# Patient Record
Sex: Male | Born: 1970
Health system: Southern US, Community
[De-identification: ages and names within clinical notes are randomized; demographics above are authoritative.]

## PROBLEM LIST (undated history)

## (undated) DIAGNOSIS — I10 Essential (primary) hypertension: Secondary | ICD-10-CM

## (undated) DIAGNOSIS — Z21 Asymptomatic human immunodeficiency virus [HIV] infection status: Secondary | ICD-10-CM

## (undated) DIAGNOSIS — B20 Human immunodeficiency virus [HIV] disease: Secondary | ICD-10-CM

## (undated) DIAGNOSIS — D849 Immunodeficiency, unspecified: Secondary | ICD-10-CM

## (undated) DIAGNOSIS — F319 Bipolar disorder, unspecified: Secondary | ICD-10-CM

## (undated) HISTORY — DX: Human immunodeficiency virus (HIV) disease: B20

## (undated) HISTORY — PX: CERVICAL FUSION: SHX112

## (undated) HISTORY — DX: Asymptomatic human immunodeficiency virus (hiv) infection status: Z21

## (undated) HISTORY — PX: BUNIONECTOMY: SHX129

---

## 2008-02-26 LAB — CONVERTED CEMR LAB: Hepatitis B Surface Ag: NEGATIVE

## 2008-03-06 ENCOUNTER — Encounter: Payer: Self-pay | Admitting: Internal Medicine

## 2008-03-07 LAB — CONVERTED CEMR LAB
Hepatitis B Surface Ag: NEGATIVE
Hepatitis B Surface Ag: NEGATIVE

## 2008-08-20 ENCOUNTER — Emergency Department (HOSPITAL_COMMUNITY): Admission: EM | Admit: 2008-08-20 | Discharge: 2008-08-20 | Payer: Self-pay | Admitting: Emergency Medicine

## 2008-09-05 ENCOUNTER — Emergency Department (HOSPITAL_COMMUNITY): Admission: EM | Admit: 2008-09-05 | Discharge: 2008-09-05 | Payer: Self-pay | Admitting: Emergency Medicine

## 2008-10-02 LAB — CONVERTED CEMR LAB
CD4 Count: 430 microliters
CD4 T Helper %: 21.7 %

## 2008-12-24 ENCOUNTER — Emergency Department (HOSPITAL_COMMUNITY): Admission: EM | Admit: 2008-12-24 | Discharge: 2008-12-24 | Payer: Self-pay | Admitting: Emergency Medicine

## 2009-04-09 ENCOUNTER — Emergency Department (HOSPITAL_COMMUNITY): Admission: EM | Admit: 2009-04-09 | Discharge: 2009-04-09 | Payer: Self-pay | Admitting: Emergency Medicine

## 2009-07-04 ENCOUNTER — Emergency Department (HOSPITAL_COMMUNITY): Admission: EM | Admit: 2009-07-04 | Discharge: 2009-07-04 | Payer: Self-pay | Admitting: Emergency Medicine

## 2009-08-01 ENCOUNTER — Emergency Department (HOSPITAL_COMMUNITY): Admission: EM | Admit: 2009-08-01 | Discharge: 2009-08-01 | Payer: Self-pay | Admitting: Emergency Medicine

## 2009-09-10 LAB — CONVERTED CEMR LAB
CD4 T Helper %: 33.2 %
Creatinine, Ser: 1.2 mg/dL
Glucose, Bld: 119 mg/dL

## 2009-09-16 ENCOUNTER — Emergency Department (HOSPITAL_COMMUNITY): Admission: EM | Admit: 2009-09-16 | Discharge: 2009-09-16 | Payer: Self-pay | Admitting: Emergency Medicine

## 2009-09-25 ENCOUNTER — Ambulatory Visit: Payer: Self-pay | Admitting: Internal Medicine

## 2009-09-25 DIAGNOSIS — D573 Sickle-cell trait: Secondary | ICD-10-CM | POA: Insufficient documentation

## 2009-09-25 DIAGNOSIS — F319 Bipolar disorder, unspecified: Secondary | ICD-10-CM | POA: Insufficient documentation

## 2009-09-25 DIAGNOSIS — I10 Essential (primary) hypertension: Secondary | ICD-10-CM | POA: Insufficient documentation

## 2009-09-25 DIAGNOSIS — M5030 Other cervical disc degeneration, unspecified cervical region: Secondary | ICD-10-CM | POA: Insufficient documentation

## 2009-09-25 DIAGNOSIS — A5400 Gonococcal infection of lower genitourinary tract, unspecified: Secondary | ICD-10-CM | POA: Insufficient documentation

## 2009-11-26 ENCOUNTER — Ambulatory Visit: Payer: Self-pay | Admitting: Internal Medicine

## 2009-11-26 DIAGNOSIS — B20 Human immunodeficiency virus [HIV] disease: Secondary | ICD-10-CM | POA: Insufficient documentation

## 2009-11-26 DIAGNOSIS — Z87898 Personal history of other specified conditions: Secondary | ICD-10-CM

## 2009-11-26 LAB — CONVERTED CEMR LAB: HIV 1 RNA Quant: 81 copies/mL — ABNORMAL HIGH (ref <48)

## 2009-12-01 LAB — CONVERTED CEMR LAB
AST: 185 units/L — ABNORMAL HIGH (ref 0–37)
Albumin: 4.3 g/dL (ref 3.5–5.2)
Alkaline Phosphatase: 205 units/L — ABNORMAL HIGH (ref 39–117)
Basophils Absolute: 0 10*3/uL (ref 0.0–0.1)
Glucose, Bld: 100 mg/dL — ABNORMAL HIGH (ref 70–99)
Hemoglobin: 12.2 g/dL — ABNORMAL LOW (ref 13.0–17.0)
LDL Cholesterol: 101 mg/dL — ABNORMAL HIGH (ref 0–99)
Lymphocytes Relative: 44 % (ref 12–46)
Lymphs Abs: 1.6 10*3/uL (ref 0.7–4.0)
Monocytes Absolute: 0.3 10*3/uL (ref 0.1–1.0)
Neutro Abs: 1.5 10*3/uL — ABNORMAL LOW (ref 1.7–7.7)
Potassium: 4.5 meq/L (ref 3.5–5.3)
RBC: 5.17 M/uL (ref 4.22–5.81)
RDW: 14.2 % (ref 11.5–15.5)
Sodium: 145 meq/L (ref 135–145)
Total Protein: 8.2 g/dL (ref 6.0–8.3)
Triglycerides: 92 mg/dL (ref <150)
WBC: 3.7 10*3/uL — ABNORMAL LOW (ref 4.0–10.5)

## 2009-12-09 ENCOUNTER — Encounter: Payer: Self-pay | Admitting: Internal Medicine

## 2009-12-10 ENCOUNTER — Ambulatory Visit: Payer: Self-pay | Admitting: Internal Medicine

## 2009-12-10 ENCOUNTER — Encounter (INDEPENDENT_AMBULATORY_CARE_PROVIDER_SITE_OTHER): Payer: Self-pay | Admitting: *Deleted

## 2009-12-10 DIAGNOSIS — R748 Abnormal levels of other serum enzymes: Secondary | ICD-10-CM | POA: Insufficient documentation

## 2009-12-11 LAB — CONVERTED CEMR LAB
ALT: 62 units/L — ABNORMAL HIGH (ref 0–53)
HCV Ab: NEGATIVE
Hepatitis B Surface Ag: NEGATIVE
Total Protein: 7.9 g/dL (ref 6.0–8.3)

## 2009-12-16 ENCOUNTER — Telehealth (INDEPENDENT_AMBULATORY_CARE_PROVIDER_SITE_OTHER): Payer: Self-pay | Admitting: *Deleted

## 2010-01-12 ENCOUNTER — Encounter (INDEPENDENT_AMBULATORY_CARE_PROVIDER_SITE_OTHER): Payer: Self-pay | Admitting: *Deleted

## 2010-04-23 ENCOUNTER — Ambulatory Visit: Payer: Self-pay | Admitting: Internal Medicine

## 2010-04-23 LAB — CONVERTED CEMR LAB
AST: 28 units/L (ref 0–37)
Albumin: 4.6 g/dL (ref 3.5–5.2)
Alkaline Phosphatase: 116 units/L (ref 39–117)
BUN: 19 mg/dL (ref 6–23)
Basophils Absolute: 0 10*3/uL (ref 0.0–0.1)
Eosinophils Relative: 6 % — ABNORMAL HIGH (ref 0–5)
Lymphocytes Relative: 43 % (ref 12–46)
Neutro Abs: 2.7 10*3/uL (ref 1.7–7.7)
Platelets: 222 10*3/uL (ref 150–400)
Potassium: 4 meq/L (ref 3.5–5.3)
RDW: 13.9 % (ref 11.5–15.5)
Sodium: 136 meq/L (ref 135–145)

## 2010-05-07 ENCOUNTER — Ambulatory Visit: Payer: Self-pay | Admitting: Internal Medicine

## 2010-05-12 ENCOUNTER — Emergency Department (HOSPITAL_COMMUNITY): Admission: EM | Admit: 2010-05-12 | Discharge: 2010-05-12 | Payer: Self-pay | Admitting: Emergency Medicine

## 2010-05-15 ENCOUNTER — Emergency Department (HOSPITAL_COMMUNITY)
Admission: EM | Admit: 2010-05-15 | Discharge: 2010-05-15 | Payer: Self-pay | Source: Home / Self Care | Admitting: Emergency Medicine

## 2010-05-22 ENCOUNTER — Emergency Department (HOSPITAL_COMMUNITY)
Admission: EM | Admit: 2010-05-22 | Discharge: 2010-05-22 | Payer: Self-pay | Source: Home / Self Care | Admitting: Emergency Medicine

## 2010-06-11 ENCOUNTER — Emergency Department (HOSPITAL_COMMUNITY)
Admission: EM | Admit: 2010-06-11 | Discharge: 2010-06-11 | Payer: Self-pay | Source: Home / Self Care | Admitting: Emergency Medicine

## 2010-07-28 ENCOUNTER — Ambulatory Visit: Payer: Self-pay | Admitting: Internal Medicine

## 2010-07-28 ENCOUNTER — Emergency Department (HOSPITAL_COMMUNITY)
Admission: EM | Admit: 2010-07-28 | Discharge: 2010-07-28 | Payer: Self-pay | Source: Home / Self Care | Admitting: Emergency Medicine

## 2010-07-28 ENCOUNTER — Telehealth: Payer: Self-pay | Admitting: Internal Medicine

## 2010-07-28 LAB — CONVERTED CEMR LAB
Alkaline Phosphatase: 88 units/L (ref 39–117)
BUN: 13 mg/dL (ref 6–23)
Chlamydia, Swab/Urine, PCR: NEGATIVE
Creatinine, Ser: 1.28 mg/dL (ref 0.40–1.50)
Eosinophils Absolute: 0.1 10*3/uL (ref 0.0–0.7)
Eosinophils Relative: 3 % (ref 0–5)
GC Probe Amp, Urine: NEGATIVE
Glucose, Bld: 102 mg/dL — ABNORMAL HIGH (ref 70–99)
HCT: 40.6 % (ref 39.0–52.0)
Hemoglobin: 12.9 g/dL — ABNORMAL LOW (ref 13.0–17.0)
Lymphs Abs: 1.8 10*3/uL (ref 0.7–4.0)
MCV: 74.1 fL — ABNORMAL LOW (ref 78.0–100.0)
Monocytes Absolute: 0.3 10*3/uL (ref 0.1–1.0)
Monocytes Relative: 7 % (ref 3–12)
Platelets: 236 10*3/uL (ref 150–400)
Sodium: 139 meq/L (ref 135–145)
Testosterone: 528.59 ng/dL (ref 350–890)
Total Bilirubin: 2.9 mg/dL — ABNORMAL HIGH (ref 0.3–1.2)
WBC: 3.8 10*3/uL — ABNORMAL LOW (ref 4.0–10.5)

## 2010-07-30 ENCOUNTER — Encounter: Payer: Self-pay | Admitting: Internal Medicine

## 2010-08-08 ENCOUNTER — Emergency Department (HOSPITAL_COMMUNITY)
Admission: EM | Admit: 2010-08-08 | Discharge: 2010-08-08 | Payer: Self-pay | Source: Home / Self Care | Admitting: Emergency Medicine

## 2010-08-27 ENCOUNTER — Telehealth: Payer: Self-pay | Admitting: Internal Medicine

## 2010-09-16 ENCOUNTER — Emergency Department (HOSPITAL_COMMUNITY)
Admission: EM | Admit: 2010-09-16 | Discharge: 2010-09-16 | Payer: Self-pay | Source: Home / Self Care | Admitting: Emergency Medicine

## 2010-10-01 ENCOUNTER — Emergency Department (HOSPITAL_COMMUNITY)
Admission: EM | Admit: 2010-10-01 | Discharge: 2010-10-01 | Payer: Self-pay | Source: Home / Self Care | Admitting: Emergency Medicine

## 2010-10-02 ENCOUNTER — Emergency Department (HOSPITAL_COMMUNITY): Admission: EM | Admit: 2010-10-02 | Discharge: 2010-10-02 | Payer: Self-pay | Admitting: Emergency Medicine

## 2010-10-06 ENCOUNTER — Emergency Department (HOSPITAL_COMMUNITY): Admission: EM | Admit: 2010-10-06 | Discharge: 2010-10-06 | Payer: Self-pay | Admitting: Emergency Medicine

## 2010-10-21 ENCOUNTER — Telehealth: Payer: Self-pay

## 2010-11-05 ENCOUNTER — Emergency Department (HOSPITAL_COMMUNITY): Admission: EM | Admit: 2010-11-05 | Discharge: 2010-11-05 | Payer: Self-pay | Admitting: Emergency Medicine

## 2010-11-07 ENCOUNTER — Emergency Department (HOSPITAL_COMMUNITY): Admission: EM | Admit: 2010-11-07 | Discharge: 2010-11-07 | Payer: Self-pay | Admitting: Emergency Medicine

## 2010-11-09 ENCOUNTER — Ambulatory Visit: Payer: Self-pay | Admitting: Internal Medicine

## 2010-11-09 LAB — CONVERTED CEMR LAB
AST: 30 units/L (ref 0–37)
Albumin: 3.9 g/dL (ref 3.5–5.2)
BUN: 13 mg/dL (ref 6–23)
Basophils Absolute: 0 10*3/uL (ref 0.0–0.1)
CO2: 22 meq/L (ref 19–32)
Calcium: 8.7 mg/dL (ref 8.4–10.5)
Chloride: 110 meq/L (ref 96–112)
Glucose, Bld: 107 mg/dL — ABNORMAL HIGH (ref 70–99)
HIV-1 RNA Quant, Log: <1.3 (ref <1.30)
Hemoglobin: 11.8 g/dL — ABNORMAL LOW (ref 13.0–17.0)
Lymphocytes Relative: 42 % (ref 12–46)
Monocytes Absolute: 0.4 10*3/uL (ref 0.1–1.0)
Neutro Abs: 2.4 10*3/uL (ref 1.7–7.7)
Potassium: 4.4 meq/L (ref 3.5–5.3)
RDW: 14.5 % (ref 11.5–15.5)
WBC: 5.2 10*3/uL (ref 4.0–10.5)

## 2010-11-23 ENCOUNTER — Ambulatory Visit: Payer: Self-pay | Admitting: Internal Medicine

## 2010-11-23 ENCOUNTER — Emergency Department (HOSPITAL_COMMUNITY)
Admission: EM | Admit: 2010-11-23 | Discharge: 2010-11-23 | Payer: Self-pay | Source: Home / Self Care | Admitting: Emergency Medicine

## 2010-11-27 ENCOUNTER — Encounter: Payer: Self-pay | Admitting: Internal Medicine

## 2010-11-27 ENCOUNTER — Telehealth (INDEPENDENT_AMBULATORY_CARE_PROVIDER_SITE_OTHER): Payer: Self-pay | Admitting: *Deleted

## 2010-12-17 ENCOUNTER — Encounter
Admission: RE | Admit: 2010-12-17 | Discharge: 2011-01-11 | Payer: Self-pay | Source: Home / Self Care | Attending: Sports Medicine | Admitting: Sports Medicine

## 2010-12-17 ENCOUNTER — Emergency Department (HOSPITAL_COMMUNITY)
Admission: EM | Admit: 2010-12-17 | Discharge: 2010-12-17 | Payer: Self-pay | Source: Home / Self Care | Admitting: Emergency Medicine

## 2010-12-25 ENCOUNTER — Encounter: Admission: RE | Admit: 2010-12-25 | Payer: Self-pay | Source: Home / Self Care | Admitting: Sports Medicine

## 2010-12-28 ENCOUNTER — Emergency Department (HOSPITAL_COMMUNITY)
Admission: EM | Admit: 2010-12-28 | Discharge: 2010-12-28 | Payer: Self-pay | Source: Home / Self Care | Admitting: Emergency Medicine

## 2010-12-31 ENCOUNTER — Encounter: Admit: 2010-12-31 | Payer: Self-pay | Admitting: Sports Medicine

## 2011-01-04 ENCOUNTER — Encounter: Admit: 2011-01-04 | Payer: Self-pay | Admitting: Sports Medicine

## 2011-01-19 NOTE — Progress Notes (Signed)
Summary: change in drug formulation per nurse  Phone Note From Pharmacy   Caller: [facility] [address] Details for Reason: change in drug formulation Summary of Call: Received a fax request to change the formulation of patient's Norvir 100mg  capusles to Norvir 100mg  tablet once daily  Spoke to Guadalupe, CMA who stated that it was ok.  Pharmacy notified. Initial call taken by: Paulo Fruit  BS,CPht II,MPH,  August 27, 2010 10:40 AM    New/Updated Medications: NORVIR 100 MG TABS (RITONAVIR) Take 1 tablet by mouth once a day Prescriptions: NORVIR 100 MG TABS (RITONAVIR) Take 1 tablet by mouth once a day  #30 x 6   Entered by:   Paulo Fruit  BS,CPht II,MPH   Authorized by:   Yisroel Ramming MD   Signed by:   Paulo Fruit  BS,CPht II,MPH on 08/27/2010   Method used:   Electronically to        [facility] [phone]* (retail)       [address]       [place], [place]  [address]       Ph: [phone]       Fax:    RxID:   [id number]  Paulo Fruit  BS,CPht II,MPH  August 27, 2010 10:41 AM

## 2011-01-19 NOTE — Progress Notes (Signed)
Summary: speak with CMA  Phone Note Call from Patient   Caller: Patient Summary of Call: Patient came in for labs today and asked me if there was anything you could prescribe for his sex drive.  He is married and he said he has no sexual urges.  He comes in to see you in two weeks.  I told him I would ask about adding a testosterone level Initial call taken by: Wendall Mola CMA Duncan Dull),  July 28, 2010 3:58 PM  Follow-up for Phone Call        please order a testosterone level Follow-up by: Yisroel Ramming MD,  July 28, 2010 4:02 PM

## 2011-01-19 NOTE — Miscellaneous (Signed)
  Clinical Lists Changes  Medications: Added new medication of VIAGRA 25 MG TABS (SILDENAFIL CITRATE) use as directed - Signed Rx of VIAGRA 25 MG TABS (SILDENAFIL CITRATE) use as directed;  #10 x 3;  Signed;  Entered by: Yisroel Ramming MD;  Authorized by: Yisroel Ramming MD;  Method used: Print then Give to Patient    Prescriptions: VIAGRA 25 MG TABS (SILDENAFIL CITRATE) use as directed  #10 x 3   Entered and Authorized by:   Yisroel Ramming MD   Signed by:   Yisroel Ramming MD on 07/30/2010   Method used:   Print then Give to Patient   RxID:   [id number]

## 2011-01-19 NOTE — Miscellaneous (Signed)
Summary: Orders Update  Clinical Lists Changes  Orders: Added new Test order of T-CD4SP [facility]) (CD4SP) - Signed Added new Test order of T-Comprehensive Metabolic Panel [id number]) - Signed Added new Test order of T-CBC w/Diff [id number]) - Signed Added new Test order of T-HIV Viral Load [id number]) - Signed Added new Test order of T-RPR (Syphilis) ([id number]) - Signed

## 2011-01-19 NOTE — Progress Notes (Signed)
Summary: pt. cancelled pain clinic appt.  Phone Note Other Incoming   Caller: pain Clinic Summary of Call: Pain Clinic sent fax to notify that pt. cancelled appt for 01/05/11, said he was going to find another pain clinic Initial call taken by: Wendall Mola CMA Duncan Dull),  November 27, 2010 11:52 AM

## 2011-01-19 NOTE — Assessment & Plan Note (Signed)
Summary: f/u [mkj]   CC:  follow-up visit, lab results, and c/o neck pain.  History of Present Illness: patient here for follow-up of his labs.  He missed about a week or week and a half of his medications because he was incarcerated.  He is back on his HIV medications now.  He complains of neck pain which he has had since he had a bone fusion several years ago.  Anti-inflammatory medications are not helping.  Depression History:      The patient is having a depressed mood most of the day and has a diminished interest in his usual daily activities.        The patient denies that he feels like life is not worth living, denies that he wishes that he were dead, and denies that he has thought about ending his life.        Preventive Screening-Counseling & Management  Alcohol-Tobacco     Alcohol drinks/day: occasional     Smoking Status: current     Packs/Day: 1/2  Caffeine-Diet-Exercise     Caffeine use/day: 0     Does Patient Exercise: yes     Type of exercise: walking     Exercise (avg: min/session): 30-60     Times/week: 4  Safety-Violence-Falls     Seat Belt Use: yes      Sexual History:  NONE.        Drug Use:  No.        Blood Transfusions:  no.        Travel History:  NO.    Comments: pt. declined condoms   Updated Prior Medication List: TRUVADA 200-300 MG TABS (EMTRICITABINE-TENOFOVIR) Take 1 tablet by mouth once a day REYATAZ 300 MG CAPS (ATAZANAVIR SULFATE) one daily SEROQUEL 200 MG TABS (QUETIAPINE FUMARATE) take two by mouth at bedtime NORVIR 100 MG TABS (RITONAVIR) Take 1 tablet by mouth once a day HYDROCHLOROTHIAZIDE 25 MG TABS (HYDROCHLOROTHIAZIDE) Take 1 tablet by mouth once a day VIAGRA 25 MG TABS (SILDENAFIL CITRATE) use as directed  Current Allergies (reviewed today): ! NSAIDS Past History:  Past Medical History: Last updated: 11/26/2009 HIV disease  Review of Systems  The patient denies anorexia, fever, and weight loss.    Vital  Signs:  Patient profile:   40 year old male Height:      68 inches (172.72 cm) Weight:      165.0 pounds (75 kg) BMI:     25.18 Temp:     98.2 degrees F (36.78 degrees C) oral Pulse rate:   69 / minute BP sitting:   141 / 82  (left arm)  Vitals Entered By: Wendall Mola CMA Duncan Dull) (November 23, 2010 9:04 AM) CC: follow-up visit, lab results, c/o neck pain Is Patient Diabetic? No Pain Assessment Patient in pain? yes     Location: neck Intensity: 7 Type: sharp Onset of pain  Constant Nutritional Status BMI of 25 - 29 = overweight Nutritional Status Detail appetite "varies"  Have you ever been in a relationship where you felt threatened, hurt or afraid?No   Does patient need assistance? Functional Status Self care Ambulation Normal Comments pt. missed 2 weeks of HAART meds. due to being incarcerated   Physical Exam  General:  alert, well-developed, well-nourished, and well-hydrated.   Head:  normocephalic and atraumatic.   Mouth:  pharynx pink and moist.   Lungs:  normal breath sounds.     Impression & Recommendations:  Problem # 1:  HIV DISEASE (  ICD-042) Pt.s most recent CD4ct was 890 and VL <20 .  Pt instructed to continue the current antiretroviral regimen.  Pt encouraged to take medication regularly and not miss doses.  Pt will f/u in 3 months for repeat blood work and will see me 2 weeks later.  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 Patient Level III (91478) Future Orders: T-CD4SP ([facility]) (CD4SP) ... 02/21/2011 T-HIV Viral Load [phone])  ... 02/21/2011 T-Comprehensive Metabolic Panel [id number]) ... 02/21/2011 T-CBC w/Diff ([id number]) ... 02/21/2011  Patient Instructions: 1)  Please schedule a follow-up appointment in 3 months, 2 weeks after labs.        Immunizations Administered:  Influenza Vaccine # 1:    Vaccine Type: Fluvax MCR    Site: left deltoid    Mfr: Novartis    Dose: 0.5 ml    Route: IM    Given by: Wendall Mola CMA ( AAMA)    Exp. Date: 03/21/2011    Lot #: [id number]    VIS given: 07/14/10 version given November 23, 2010.  PPD Skin Test:    Vaccine Type: PPD    Site: right forearm    Mfr: Sanofi Pasteur    Dose: 0.1 ml    Route: ID    Given by: Wendall Mola CMA ( AAMA)    Exp. Date: 10/22/2011    Lot #: [id number]  Flu Vaccine Consent Questions:    Do you have a history of severe allergic reactions to this vaccine? no    Any prior history of allergic reactions to egg and/or gelatin? no    Do you have a sensitivity to the preservative Thimersol? no    Do you have a past history of Guillan-Barre Syndrome? no    Do you currently have an acute febrile illness? no    Have you ever had a severe reaction to latex? no    Vaccine information given and explained to patient? yes

## 2011-01-19 NOTE — Progress Notes (Signed)
Summary: [facility]  calling   Phone Note Other Incoming   Caller: [facility] jail- Lawyer of Call: Requesting med list.  They have requested medical records several days ago but they have not received them. Last OV and chart summary sent .  [phone] fax [facility] Tomasita Morrow RN  October 21, 2010 11:39 AM  Initial call taken by: Tomasita Morrow RN,  October 21, 2010 11:40 AM

## 2011-01-19 NOTE — Miscellaneous (Signed)
Summary: clinical update/ryan white [place] til 03/20/11  Clinical Lists Changes  Observations: Added new observation of AIDSDAP: Yes 2011 (01/12/2010 10:34)
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
## 2011-01-21 NOTE — Miscellaneous (Signed)
Summary: PPD results  Clinical Lists Changes  Observations: Added new observation of TB PPDRESULT: negative (11/27/2010 9:24) Added new observation of PPD RESULT: < 5mm (11/27/2010 9:24) Added new observation of TB-PPD RDDTE: 11/26/2010 (11/27/2010 9:24)      PPD Results    Date of reading: 11/26/2010    Results: < 5mm    Interpretation: negative

## 2011-01-30 ENCOUNTER — Emergency Department (HOSPITAL_COMMUNITY)
Admission: EM | Admit: 2011-01-30 | Discharge: 2011-01-30 | Disposition: A | Discharge status: Home / Self Care | Payer: Medicare Other | Attending: Emergency Medicine | Admitting: Emergency Medicine

## 2011-01-30 DIAGNOSIS — M542 Cervicalgia: Secondary | ICD-10-CM | POA: Insufficient documentation

## 2011-01-30 DIAGNOSIS — G8929 Other chronic pain: Secondary | ICD-10-CM | POA: Insufficient documentation

## 2011-01-30 DIAGNOSIS — Z21 Asymptomatic human immunodeficiency virus [HIV] infection status: Secondary | ICD-10-CM | POA: Insufficient documentation

## 2011-01-30 DIAGNOSIS — Z981 Arthrodesis status: Secondary | ICD-10-CM | POA: Insufficient documentation

## 2011-01-30 DIAGNOSIS — Z79899 Other long term (current) drug therapy: Secondary | ICD-10-CM | POA: Insufficient documentation

## 2011-01-30 DIAGNOSIS — F17200 Nicotine dependence, unspecified, uncomplicated: Secondary | ICD-10-CM | POA: Insufficient documentation

## 2011-02-08 ENCOUNTER — Emergency Department (HOSPITAL_COMMUNITY): Payer: Medicare Other

## 2011-02-08 ENCOUNTER — Emergency Department (HOSPITAL_COMMUNITY)
Admission: EM | Admit: 2011-02-08 | Discharge: 2011-02-08 | Disposition: A | Discharge status: Home / Self Care | Payer: Medicare Other | Attending: Emergency Medicine | Admitting: Emergency Medicine

## 2011-02-08 DIAGNOSIS — M542 Cervicalgia: Secondary | ICD-10-CM | POA: Insufficient documentation

## 2011-02-08 DIAGNOSIS — S139XXA Sprain of joints and ligaments of unspecified parts of neck, initial encounter: Secondary | ICD-10-CM | POA: Insufficient documentation

## 2011-02-08 DIAGNOSIS — Z21 Asymptomatic human immunodeficiency virus [HIV] infection status: Secondary | ICD-10-CM | POA: Insufficient documentation

## 2011-02-08 DIAGNOSIS — Z79899 Other long term (current) drug therapy: Secondary | ICD-10-CM | POA: Insufficient documentation

## 2011-02-08 DIAGNOSIS — F17200 Nicotine dependence, unspecified, uncomplicated: Secondary | ICD-10-CM | POA: Insufficient documentation

## 2011-02-08 DIAGNOSIS — Y929 Unspecified place or not applicable: Secondary | ICD-10-CM | POA: Insufficient documentation

## 2011-02-15 ENCOUNTER — Other Ambulatory Visit (INDEPENDENT_AMBULATORY_CARE_PROVIDER_SITE_OTHER): Payer: Medicare Other

## 2011-02-15 ENCOUNTER — Other Ambulatory Visit: Payer: Self-pay | Admitting: Adult Health

## 2011-02-15 ENCOUNTER — Encounter: Payer: Self-pay | Admitting: Adult Health

## 2011-02-15 DIAGNOSIS — B20 Human immunodeficiency virus [HIV] disease: Secondary | ICD-10-CM

## 2011-02-15 LAB — CONVERTED CEMR LAB
AST: 18 units/L (ref 0–37)
Alkaline Phosphatase: 89 units/L (ref 39–117)
BUN: 10 mg/dL (ref 6–23)
Basophils Absolute: 0 10*3/uL (ref 0.0–0.1)
Basophils Relative: 0 % (ref 0–1)
Creatinine, Ser: 1.11 mg/dL (ref 0.40–1.50)
Eosinophils Relative: 1 % (ref 0–5)
HCT: 41 % (ref 39.0–52.0)
Hemoglobin: 13.3 g/dL (ref 13.0–17.0)
MCHC: 32.4 g/dL (ref 30.0–36.0)
MCV: 74.1 fL — ABNORMAL LOW (ref 78.0–100.0)
Monocytes Absolute: 0.8 10*3/uL (ref 0.1–1.0)
Potassium: 4.5 meq/L (ref 3.5–5.3)
RDW: 14.1 % (ref 11.5–15.5)

## 2011-02-16 LAB — T-HELPER CELL (CD4) - (RCID CLINIC ONLY): CD4 T Cell Abs: 1030 uL (ref 400–2700)

## 2011-03-01 ENCOUNTER — Ambulatory Visit: Payer: Medicare Other | Admitting: Adult Health

## 2011-03-09 LAB — T-HELPER CELL (CD4) - (RCID CLINIC ONLY)
CD4 % Helper T Cell: 38 % (ref 33–55)
CD4 T Cell Abs: 990 uL (ref 400–2700)

## 2011-03-10 ENCOUNTER — Emergency Department (HOSPITAL_COMMUNITY)
Admission: EM | Admit: 2011-03-10 | Discharge: 2011-03-10 | Disposition: A | Discharge status: Home / Self Care | Payer: Medicare Other | Attending: Emergency Medicine | Admitting: Emergency Medicine

## 2011-03-10 ENCOUNTER — Emergency Department (HOSPITAL_COMMUNITY): Payer: Medicare Other

## 2011-03-10 DIAGNOSIS — M542 Cervicalgia: Secondary | ICD-10-CM | POA: Insufficient documentation

## 2011-03-10 DIAGNOSIS — G8929 Other chronic pain: Secondary | ICD-10-CM | POA: Insufficient documentation

## 2011-03-10 DIAGNOSIS — Z21 Asymptomatic human immunodeficiency virus [HIV] infection status: Secondary | ICD-10-CM | POA: Insufficient documentation

## 2011-03-10 DIAGNOSIS — W07XXXA Fall from chair, initial encounter: Secondary | ICD-10-CM | POA: Insufficient documentation

## 2011-03-23 LAB — T-HELPER CELL (CD4) - (RCID CLINIC ONLY): CD4 % Helper T Cell: 39 % (ref 33–55)

## 2011-03-30 ENCOUNTER — Ambulatory Visit: Payer: Medicare Other | Admitting: Adult Health

## 2011-04-09 ENCOUNTER — Emergency Department (HOSPITAL_COMMUNITY): Payer: Medicare Other

## 2011-04-09 ENCOUNTER — Emergency Department (HOSPITAL_COMMUNITY)
Admission: EM | Admit: 2011-04-09 | Discharge: 2011-04-09 | Discharge status: Against Medical Advice | Payer: Medicare Other | Attending: Emergency Medicine | Admitting: Emergency Medicine

## 2011-04-09 DIAGNOSIS — Z21 Asymptomatic human immunodeficiency virus [HIV] infection status: Secondary | ICD-10-CM | POA: Insufficient documentation

## 2011-04-09 DIAGNOSIS — G8929 Other chronic pain: Secondary | ICD-10-CM | POA: Insufficient documentation

## 2011-04-09 DIAGNOSIS — W010XXA Fall on same level from slipping, tripping and stumbling without subsequent striking against object, initial encounter: Secondary | ICD-10-CM | POA: Insufficient documentation

## 2011-04-09 DIAGNOSIS — Z79899 Other long term (current) drug therapy: Secondary | ICD-10-CM | POA: Insufficient documentation

## 2011-04-09 DIAGNOSIS — M542 Cervicalgia: Secondary | ICD-10-CM | POA: Insufficient documentation

## 2011-04-13 ENCOUNTER — Emergency Department (HOSPITAL_COMMUNITY)
Admission: EM | Admit: 2011-04-13 | Discharge: 2011-04-13 | Disposition: A | Discharge status: Home / Self Care | Payer: Medicare Other | Attending: Emergency Medicine | Admitting: Emergency Medicine

## 2011-04-13 ENCOUNTER — Emergency Department (HOSPITAL_COMMUNITY): Payer: Medicare Other

## 2011-04-13 DIAGNOSIS — Z21 Asymptomatic human immunodeficiency virus [HIV] infection status: Secondary | ICD-10-CM | POA: Insufficient documentation

## 2011-04-13 DIAGNOSIS — G8929 Other chronic pain: Secondary | ICD-10-CM | POA: Insufficient documentation

## 2011-04-13 DIAGNOSIS — W010XXA Fall on same level from slipping, tripping and stumbling without subsequent striking against object, initial encounter: Secondary | ICD-10-CM | POA: Insufficient documentation

## 2011-04-13 DIAGNOSIS — Z79899 Other long term (current) drug therapy: Secondary | ICD-10-CM | POA: Insufficient documentation

## 2011-04-13 DIAGNOSIS — M542 Cervicalgia: Secondary | ICD-10-CM | POA: Insufficient documentation

## 2011-04-19 ENCOUNTER — Other Ambulatory Visit (INDEPENDENT_AMBULATORY_CARE_PROVIDER_SITE_OTHER): Payer: Medicare Other

## 2011-04-19 DIAGNOSIS — I10 Essential (primary) hypertension: Secondary | ICD-10-CM

## 2011-04-19 DIAGNOSIS — Z79899 Other long term (current) drug therapy: Secondary | ICD-10-CM

## 2011-04-19 DIAGNOSIS — B20 Human immunodeficiency virus [HIV] disease: Secondary | ICD-10-CM

## 2011-04-19 LAB — COMPREHENSIVE METABOLIC PANEL
ALT: 21 U/L (ref 0–53)
AST: 20 U/L (ref 0–37)
Alkaline Phosphatase: 92 U/L (ref 39–117)
Creat: 1.19 mg/dL (ref 0.40–1.50)
Sodium: 139 mEq/L (ref 135–145)
Total Bilirubin: 1.2 mg/dL (ref 0.3–1.2)
Total Protein: 7.3 g/dL (ref 6.0–8.3)

## 2011-04-19 LAB — LIPID PANEL
HDL: 36 mg/dL — ABNORMAL LOW (ref >39)
LDL Cholesterol: 105 mg/dL — ABNORMAL HIGH (ref 0–99)
Total CHOL/HDL Ratio: 4.3 Ratio
Triglycerides: 59 mg/dL (ref <150)
VLDL: 12 mg/dL (ref 0–40)

## 2011-04-19 LAB — CBC WITH DIFFERENTIAL/PLATELET
Basophils Absolute: 0 10*3/uL (ref 0.0–0.1)
Lymphocytes Relative: 35 % (ref 12–46)
Lymphs Abs: 1.5 10*3/uL (ref 0.7–4.0)
MCV: 73.3 fL — ABNORMAL LOW (ref 78.0–100.0)
Neutro Abs: 2.4 10*3/uL (ref 1.7–7.7)
Platelets: 233 10*3/uL (ref 150–400)
RBC: 5.73 MIL/uL (ref 4.22–5.81)
RDW: 14.5 % (ref 11.5–15.5)
WBC: 4.2 10*3/uL (ref 4.0–10.5)

## 2011-04-21 LAB — HIV-1 RNA QUANT-NO REFLEX-BLD: HIV 1 RNA Quant: 29 copies/mL — ABNORMAL HIGH (ref <20)

## 2011-05-03 ENCOUNTER — Ambulatory Visit: Payer: Medicare Other | Admitting: Adult Health

## 2011-05-09 ENCOUNTER — Emergency Department (HOSPITAL_COMMUNITY): Payer: Medicare Other

## 2011-05-09 ENCOUNTER — Emergency Department (HOSPITAL_COMMUNITY)
Admission: EM | Admit: 2011-05-09 | Discharge: 2011-05-09 | Disposition: A | Discharge status: Home / Self Care | Payer: Medicare Other | Attending: Emergency Medicine | Admitting: Emergency Medicine

## 2011-05-09 DIAGNOSIS — S139XXA Sprain of joints and ligaments of unspecified parts of neck, initial encounter: Secondary | ICD-10-CM | POA: Insufficient documentation

## 2011-05-09 DIAGNOSIS — Z79899 Other long term (current) drug therapy: Secondary | ICD-10-CM | POA: Insufficient documentation

## 2011-05-09 DIAGNOSIS — M25519 Pain in unspecified shoulder: Secondary | ICD-10-CM | POA: Insufficient documentation

## 2011-05-09 DIAGNOSIS — M542 Cervicalgia: Secondary | ICD-10-CM | POA: Insufficient documentation

## 2011-05-09 DIAGNOSIS — Z21 Asymptomatic human immunodeficiency virus [HIV] infection status: Secondary | ICD-10-CM | POA: Insufficient documentation

## 2011-05-14 ENCOUNTER — Emergency Department (HOSPITAL_COMMUNITY)
Admission: EM | Admit: 2011-05-14 | Discharge: 2011-05-14 | Discharge status: Against Medical Advice | Payer: Medicare Other | Attending: Emergency Medicine | Admitting: Emergency Medicine

## 2011-05-14 ENCOUNTER — Emergency Department (HOSPITAL_COMMUNITY): Payer: Medicare Other

## 2011-05-14 ENCOUNTER — Emergency Department (HOSPITAL_COMMUNITY)
Admission: EM | Admit: 2011-05-14 | Discharge: 2011-05-14 | Disposition: A | Discharge status: Home / Self Care | Payer: Medicare Other | Attending: Emergency Medicine | Admitting: Emergency Medicine

## 2011-05-14 DIAGNOSIS — M542 Cervicalgia: Secondary | ICD-10-CM | POA: Insufficient documentation

## 2011-05-14 DIAGNOSIS — G8929 Other chronic pain: Secondary | ICD-10-CM | POA: Insufficient documentation

## 2011-05-14 DIAGNOSIS — F17200 Nicotine dependence, unspecified, uncomplicated: Secondary | ICD-10-CM | POA: Insufficient documentation

## 2011-05-14 DIAGNOSIS — Z21 Asymptomatic human immunodeficiency virus [HIV] infection status: Secondary | ICD-10-CM | POA: Insufficient documentation

## 2011-05-14 DIAGNOSIS — M5030 Other cervical disc degeneration, unspecified cervical region: Secondary | ICD-10-CM | POA: Insufficient documentation

## 2011-05-14 DIAGNOSIS — Y92410 Unspecified street and highway as the place of occurrence of the external cause: Secondary | ICD-10-CM | POA: Insufficient documentation

## 2011-05-14 DIAGNOSIS — S139XXA Sprain of joints and ligaments of unspecified parts of neck, initial encounter: Secondary | ICD-10-CM | POA: Insufficient documentation

## 2011-06-04 ENCOUNTER — Other Ambulatory Visit: Payer: Self-pay | Admitting: Adult Health

## 2011-06-04 ENCOUNTER — Encounter: Payer: Self-pay | Admitting: Adult Health

## 2011-06-04 ENCOUNTER — Ambulatory Visit (INDEPENDENT_AMBULATORY_CARE_PROVIDER_SITE_OTHER): Payer: Medicare Other | Admitting: Adult Health

## 2011-06-04 ENCOUNTER — Emergency Department (HOSPITAL_COMMUNITY)
Admission: EM | Admit: 2011-06-04 | Discharge: 2011-06-04 | Disposition: A | Discharge status: Home / Self Care | Payer: Medicare Other | Attending: Emergency Medicine | Admitting: Emergency Medicine

## 2011-06-04 VITALS — BP 126/89 | HR 89 | Temp 98.5°F | Ht 66.5 in | Wt 168.0 lb

## 2011-06-04 DIAGNOSIS — Z7289 Other problems related to lifestyle: Secondary | ICD-10-CM

## 2011-06-04 DIAGNOSIS — Z21 Asymptomatic human immunodeficiency virus [HIV] infection status: Secondary | ICD-10-CM | POA: Insufficient documentation

## 2011-06-04 DIAGNOSIS — S139XXA Sprain of joints and ligaments of unspecified parts of neck, initial encounter: Secondary | ICD-10-CM | POA: Insufficient documentation

## 2011-06-04 DIAGNOSIS — B20 Human immunodeficiency virus [HIV] disease: Secondary | ICD-10-CM

## 2011-06-04 DIAGNOSIS — M542 Cervicalgia: Secondary | ICD-10-CM | POA: Insufficient documentation

## 2011-06-04 DIAGNOSIS — M25569 Pain in unspecified knee: Secondary | ICD-10-CM | POA: Insufficient documentation

## 2011-06-04 DIAGNOSIS — Z7251 High risk heterosexual behavior: Secondary | ICD-10-CM

## 2011-06-04 DIAGNOSIS — S8000XA Contusion of unspecified knee, initial encounter: Secondary | ICD-10-CM | POA: Insufficient documentation

## 2011-06-04 DIAGNOSIS — Z1159 Encounter for screening for other viral diseases: Secondary | ICD-10-CM

## 2011-06-04 DIAGNOSIS — Z113 Encounter for screening for infections with a predominantly sexual mode of transmission: Secondary | ICD-10-CM

## 2011-06-04 DIAGNOSIS — S6390XA Sprain of unspecified part of unspecified wrist and hand, initial encounter: Secondary | ICD-10-CM | POA: Insufficient documentation

## 2011-06-04 DIAGNOSIS — M79609 Pain in unspecified limb: Secondary | ICD-10-CM | POA: Insufficient documentation

## 2011-06-04 DIAGNOSIS — Z0000 Encounter for general adult medical examination without abnormal findings: Secondary | ICD-10-CM

## 2011-06-04 NOTE — Progress Notes (Signed)
  Subjective:    Patient ID: Gary [name], male    DOB: 1971-05-07, 40 y.o.   MRN: [id number]  HPI Presents to clinic for followup. Last labs were obtained 04/19/2011. Has been out of medications for over 2 weeks as a result of his ADAP lapsing. However, he voices no physical complaints and when he was taking his medications, endorses adherence with good tolerance and no complications.    Review of Systems  Constitutional: Negative.   HENT: Negative.   Eyes: Negative.   Respiratory: Negative.   Cardiovascular: Negative.   Gastrointestinal: Negative.   Genitourinary: Negative.   Musculoskeletal: Negative.   Skin: Negative.   Neurological: Negative.   Hematological: Negative.   Psychiatric/Behavioral: Negative.        Objective:   Physical Exam  Constitutional: He is oriented to person, place, and time. He appears well-developed and well-nourished. No distress.  HENT:  Head: Normocephalic and atraumatic.  Right Ear: External ear normal.  Left Ear: External ear normal.  Nose: Nose normal.  Mouth/Throat: Oropharynx is clear and moist. No oropharyngeal exudate.  Eyes: Conjunctivae and EOM are normal. Pupils are equal, round, and reactive to light. Right eye exhibits no discharge. Left eye exhibits no discharge.  Neck: Normal range of motion. Neck supple.  Cardiovascular: Normal rate, regular rhythm, normal heart sounds and intact distal pulses.   Pulmonary/Chest: Effort normal and breath sounds normal.  Abdominal: Soft. Bowel sounds are normal.  Musculoskeletal: Normal range of motion.  Neurological: He is alert and oriented to person, place, and time. No cranial nerve deficit. He exhibits normal muscle tone. Coordination normal.  Skin: Skin is warm and dry.  Psychiatric: He has a normal mood and affect. His behavior is normal. Judgment and thought content normal.          Assessment & Plan:  1. HIV. Labs obtained 04/19/2011 show a CD4 count of 570 39% with a viral load of  29. Copies/mL. Clinically stable on current regimen. However, has been off medications for more than 2 weeks. Recommend following up with [employer]lig[employer] to reinstate his ADAP coverage. Once he resumes his therapy, we asked that he return to clinic 4 weeks for repeat labs for followup. 6 weeks after resuming therapy. He verbally acknowledged this information and agreed with plan of care.

## 2011-06-11 ENCOUNTER — Other Ambulatory Visit (INDEPENDENT_AMBULATORY_CARE_PROVIDER_SITE_OTHER): Payer: Medicare Other | Admitting: *Deleted

## 2011-06-11 DIAGNOSIS — B20 Human immunodeficiency virus [HIV] disease: Secondary | ICD-10-CM

## 2011-06-11 MED ORDER — EMTRICITABINE-TENOFOVIR DF 200-300 MG PO TABS: 1.0000 | ORAL_TABLET | Freq: Every day | ORAL | Status: DC

## 2011-06-11 MED ORDER — ATAZANAVIR SULFATE 300 MG PO CAPS: 300.0000 mg | ORAL_CAPSULE | Freq: Every day | ORAL | Status: DC

## 2011-06-11 MED ORDER — RITONAVIR 100 MG PO CAPS: 100.0000 mg | ORAL_CAPSULE | Freq: Every day | ORAL | Status: DC

## 2011-06-14 ENCOUNTER — Emergency Department (HOSPITAL_COMMUNITY)
Admission: EM | Admit: 2011-06-14 | Discharge: 2011-06-14 | Disposition: A | Discharge status: Home / Self Care | Payer: Medicare Other | Attending: Emergency Medicine | Admitting: Emergency Medicine

## 2011-06-14 DIAGNOSIS — K089 Disorder of teeth and supporting structures, unspecified: Secondary | ICD-10-CM | POA: Insufficient documentation

## 2011-06-14 DIAGNOSIS — Z21 Asymptomatic human immunodeficiency virus [HIV] infection status: Secondary | ICD-10-CM | POA: Insufficient documentation

## 2011-06-14 DIAGNOSIS — G8929 Other chronic pain: Secondary | ICD-10-CM | POA: Insufficient documentation

## 2011-06-14 DIAGNOSIS — K029 Dental caries, unspecified: Secondary | ICD-10-CM | POA: Insufficient documentation

## 2011-06-14 DIAGNOSIS — Z79899 Other long term (current) drug therapy: Secondary | ICD-10-CM | POA: Insufficient documentation

## 2011-06-26 ENCOUNTER — Emergency Department (HOSPITAL_COMMUNITY)
Admission: EM | Admit: 2011-06-26 | Discharge: 2011-06-26 | Disposition: A | Discharge status: Home / Self Care | Payer: Medicare Other | Attending: Emergency Medicine | Admitting: Emergency Medicine

## 2011-06-26 DIAGNOSIS — M79609 Pain in unspecified limb: Secondary | ICD-10-CM | POA: Insufficient documentation

## 2011-06-26 DIAGNOSIS — M545 Low back pain, unspecified: Secondary | ICD-10-CM | POA: Insufficient documentation

## 2011-06-26 DIAGNOSIS — Z79899 Other long term (current) drug therapy: Secondary | ICD-10-CM | POA: Insufficient documentation

## 2011-06-26 DIAGNOSIS — X58XXXA Exposure to other specified factors, initial encounter: Secondary | ICD-10-CM | POA: Insufficient documentation

## 2011-06-26 DIAGNOSIS — Z21 Asymptomatic human immunodeficiency virus [HIV] infection status: Secondary | ICD-10-CM | POA: Insufficient documentation

## 2011-06-26 DIAGNOSIS — G8929 Other chronic pain: Secondary | ICD-10-CM | POA: Insufficient documentation

## 2011-06-26 DIAGNOSIS — S335XXA Sprain of ligaments of lumbar spine, initial encounter: Secondary | ICD-10-CM | POA: Insufficient documentation

## 2011-07-01 ENCOUNTER — Emergency Department (HOSPITAL_COMMUNITY)
Admission: EM | Admit: 2011-07-01 | Discharge: 2011-07-01 | Disposition: A | Discharge status: Home / Self Care | Payer: Medicare Other | Attending: Emergency Medicine | Admitting: Emergency Medicine

## 2011-07-01 DIAGNOSIS — Z21 Asymptomatic human immunodeficiency virus [HIV] infection status: Secondary | ICD-10-CM | POA: Insufficient documentation

## 2011-07-01 DIAGNOSIS — F17200 Nicotine dependence, unspecified, uncomplicated: Secondary | ICD-10-CM | POA: Insufficient documentation

## 2011-07-01 DIAGNOSIS — S139XXA Sprain of joints and ligaments of unspecified parts of neck, initial encounter: Secondary | ICD-10-CM | POA: Insufficient documentation

## 2011-07-01 DIAGNOSIS — W19XXXA Unspecified fall, initial encounter: Secondary | ICD-10-CM | POA: Insufficient documentation

## 2011-07-01 DIAGNOSIS — Y9302 Activity, running: Secondary | ICD-10-CM | POA: Insufficient documentation

## 2011-07-26 ENCOUNTER — Emergency Department (HOSPITAL_COMMUNITY)
Admission: EM | Admit: 2011-07-26 | Discharge: 2011-07-26 | Disposition: A | Discharge status: Home / Self Care | Payer: Medicare Other | Attending: Emergency Medicine | Admitting: Emergency Medicine

## 2011-07-26 ENCOUNTER — Emergency Department (HOSPITAL_COMMUNITY): Payer: Medicare Other

## 2011-07-26 DIAGNOSIS — G8929 Other chronic pain: Secondary | ICD-10-CM | POA: Insufficient documentation

## 2011-07-26 DIAGNOSIS — Z79899 Other long term (current) drug therapy: Secondary | ICD-10-CM | POA: Insufficient documentation

## 2011-07-26 DIAGNOSIS — M542 Cervicalgia: Secondary | ICD-10-CM | POA: Insufficient documentation

## 2011-07-26 DIAGNOSIS — S139XXA Sprain of joints and ligaments of unspecified parts of neck, initial encounter: Secondary | ICD-10-CM | POA: Insufficient documentation

## 2011-07-26 DIAGNOSIS — M546 Pain in thoracic spine: Secondary | ICD-10-CM | POA: Insufficient documentation

## 2011-07-26 DIAGNOSIS — Z21 Asymptomatic human immunodeficiency virus [HIV] infection status: Secondary | ICD-10-CM | POA: Insufficient documentation

## 2011-08-12 ENCOUNTER — Emergency Department (HOSPITAL_COMMUNITY): Payer: Medicare Other

## 2011-08-12 ENCOUNTER — Emergency Department (HOSPITAL_COMMUNITY)
Admission: EM | Admit: 2011-08-12 | Discharge: 2011-08-12 | Disposition: A | Discharge status: Home / Self Care | Payer: Medicare Other | Attending: Emergency Medicine | Admitting: Emergency Medicine

## 2011-08-12 DIAGNOSIS — Z981 Arthrodesis status: Secondary | ICD-10-CM | POA: Insufficient documentation

## 2011-08-12 DIAGNOSIS — M25519 Pain in unspecified shoulder: Secondary | ICD-10-CM | POA: Insufficient documentation

## 2011-08-12 DIAGNOSIS — Z21 Asymptomatic human immunodeficiency virus [HIV] infection status: Secondary | ICD-10-CM | POA: Insufficient documentation

## 2011-08-12 DIAGNOSIS — Y9355 Activity, bike riding: Secondary | ICD-10-CM | POA: Insufficient documentation

## 2011-08-12 DIAGNOSIS — IMO0002 Reserved for concepts with insufficient information to code with codable children: Secondary | ICD-10-CM | POA: Insufficient documentation

## 2011-08-12 DIAGNOSIS — F17200 Nicotine dependence, unspecified, uncomplicated: Secondary | ICD-10-CM | POA: Insufficient documentation

## 2011-08-12 DIAGNOSIS — S40019A Contusion of unspecified shoulder, initial encounter: Secondary | ICD-10-CM | POA: Insufficient documentation

## 2011-08-12 DIAGNOSIS — Z79899 Other long term (current) drug therapy: Secondary | ICD-10-CM | POA: Insufficient documentation

## 2011-09-21 ENCOUNTER — Telehealth: Payer: Self-pay | Admitting: *Deleted

## 2011-09-21 ENCOUNTER — Other Ambulatory Visit: Payer: Self-pay | Admitting: *Deleted

## 2011-09-21 DIAGNOSIS — B20 Human immunodeficiency virus [HIV] disease: Secondary | ICD-10-CM

## 2011-09-21 MED ORDER — RITONAVIR 100 MG PO CAPS: 100.0000 mg | ORAL_CAPSULE | Freq: Every day | ORAL | Status: DC

## 2011-09-21 MED ORDER — EMTRICITABINE-TENOFOVIR DF 200-300 MG PO TABS: 1.0000 | ORAL_TABLET | Freq: Every day | ORAL | Status: DC

## 2011-09-21 MED ORDER — ATAZANAVIR SULFATE 300 MG PO CAPS: 300.0000 mg | ORAL_CAPSULE | Freq: Every day | ORAL | Status: DC

## 2011-09-21 NOTE — Telephone Encounter (Signed)
Pt requested refills.  Has follow up appt for lab work and w/ Dr. Daiva Eves.

## 2011-09-21 NOTE — Telephone Encounter (Signed)
Pt taking OTC cough medicine and cough drops for "hacking cough," slight sore throat.  May have had a slight fever but had not taken his temperature.  No nasal drainage reported.  Pt is a 1/2 ppd smoker.  RN advised starting OTC Mucinex, Claritin or Allegra.  Advised if pt develops a fever to go the the [facility].  Pt also mentioned that he needs a f/u appt for his HIV.  Pt given appt for lab work and with MD for f/u of HIV.  Pt verbalized understanding of RN advise and f/u visit appts.  Jennet Maduro, RN

## 2011-09-22 ENCOUNTER — Other Ambulatory Visit (INDEPENDENT_AMBULATORY_CARE_PROVIDER_SITE_OTHER): Payer: Medicare Other

## 2011-09-22 ENCOUNTER — Other Ambulatory Visit: Payer: Self-pay | Admitting: Infectious Diseases

## 2011-09-22 DIAGNOSIS — Z7289 Other problems related to lifestyle: Secondary | ICD-10-CM

## 2011-09-22 DIAGNOSIS — B20 Human immunodeficiency virus [HIV] disease: Secondary | ICD-10-CM

## 2011-09-22 LAB — COMPLETE METABOLIC PANEL WITH GFR
ALT: 12 U/L (ref 0–53)
AST: 15 U/L (ref 0–37)
Albumin: 4.2 g/dL (ref 3.5–5.2)
BUN: 13 mg/dL (ref 6–23)
CO2: 24 mEq/L (ref 19–32)
Calcium: 9.2 mg/dL (ref 8.4–10.5)
Chloride: 104 mEq/L (ref 96–112)
Creat: 1.15 mg/dL (ref 0.50–1.35)
GFR, Est African American: >60 mL/min (ref >60)
Potassium: 4 mEq/L (ref 3.5–5.3)

## 2011-09-22 LAB — CBC WITH DIFFERENTIAL/PLATELET
Basophils Relative: 0 % (ref 0–1)
Eosinophils Absolute: 0.4 10*3/uL (ref 0.0–0.7)
HCT: 40.7 % (ref 39.0–52.0)
Hemoglobin: 13 g/dL (ref 13.0–17.0)
Lymphs Abs: 1.4 10*3/uL (ref 0.7–4.0)
MCH: 23.3 pg — ABNORMAL LOW (ref 26.0–34.0)
MCHC: 31.9 g/dL (ref 30.0–36.0)
MCV: 73.1 fL — ABNORMAL LOW (ref 78.0–100.0)
Monocytes Absolute: 0.6 10*3/uL (ref 0.1–1.0)
Monocytes Relative: 9 % (ref 3–12)
RBC: 5.57 MIL/uL (ref 4.22–5.81)

## 2011-09-23 LAB — T-HELPER CELL (CD4) - (RCID CLINIC ONLY): CD4 % Helper T Cell: 44 % (ref 33–55)

## 2011-09-24 LAB — HIV-1 RNA QUANT-NO REFLEX-BLD
HIV 1 RNA Quant: <20 copies/mL (ref <20)
HIV-1 RNA Quant, Log: <1.3 {Log} (ref <1.30)

## 2011-10-01 ENCOUNTER — Emergency Department (HOSPITAL_COMMUNITY)
Admission: EM | Admit: 2011-10-01 | Discharge: 2011-10-01 | Disposition: A | Discharge status: Home / Self Care | Payer: Medicare Other | Attending: Emergency Medicine | Admitting: Emergency Medicine

## 2011-10-01 ENCOUNTER — Emergency Department (HOSPITAL_COMMUNITY): Payer: Medicare Other

## 2011-10-01 DIAGNOSIS — F17200 Nicotine dependence, unspecified, uncomplicated: Secondary | ICD-10-CM | POA: Insufficient documentation

## 2011-10-01 DIAGNOSIS — Z21 Asymptomatic human immunodeficiency virus [HIV] infection status: Secondary | ICD-10-CM | POA: Insufficient documentation

## 2011-10-01 DIAGNOSIS — W1789XA Other fall from one level to another, initial encounter: Secondary | ICD-10-CM | POA: Insufficient documentation

## 2011-10-01 DIAGNOSIS — M542 Cervicalgia: Secondary | ICD-10-CM | POA: Insufficient documentation

## 2011-10-04 ENCOUNTER — Ambulatory Visit: Payer: Medicare Other | Admitting: Infectious Disease

## 2011-10-06 ENCOUNTER — Encounter: Payer: Self-pay | Admitting: Infectious Disease

## 2011-10-06 ENCOUNTER — Ambulatory Visit (INDEPENDENT_AMBULATORY_CARE_PROVIDER_SITE_OTHER): Payer: Medicare Other | Admitting: Infectious Disease

## 2011-10-06 VITALS — BP 120/82 | HR 85 | Wt 161.5 lb

## 2011-10-06 DIAGNOSIS — I10 Essential (primary) hypertension: Secondary | ICD-10-CM

## 2011-10-06 DIAGNOSIS — F17200 Nicotine dependence, unspecified, uncomplicated: Secondary | ICD-10-CM

## 2011-10-06 DIAGNOSIS — Z23 Encounter for immunization: Secondary | ICD-10-CM

## 2011-10-06 DIAGNOSIS — B20 Human immunodeficiency virus [HIV] disease: Secondary | ICD-10-CM

## 2011-10-06 DIAGNOSIS — Z113 Encounter for screening for infections with a predominantly sexual mode of transmission: Secondary | ICD-10-CM

## 2011-10-06 MED ORDER — NICOTINE 14 MG/24HR TD PT24: 1.0000 | MEDICATED_PATCH | TRANSDERMAL | Status: DC

## 2011-10-06 MED ORDER — RITONAVIR 100 MG PO TABS: 100.0000 mg | ORAL_TABLET | Freq: Every day | ORAL | Status: DC

## 2011-10-06 MED ORDER — NICOTINE 7 MG/24HR TD PT24: 1.0000 | MEDICATED_PATCH | TRANSDERMAL | Status: DC

## 2011-10-06 NOTE — Assessment & Plan Note (Signed)
Reasonable control. 

## 2011-10-06 NOTE — Progress Notes (Signed)
  Subjective:    Patient ID: Gary [name], male    DOB: [birth year], 40 y.o.   MRN: [id number]  HPI  Gary [name] is a 40 y.o. male who is doing superbly well on their antiviral regimen, with undetectable viral load and health cd4 count. He is still smoking but willing to stop. He has no other complaints today.   Review of Systems  Constitutional: Negative for fever, chills, diaphoresis, activity change, appetite change, fatigue and unexpected weight change.  HENT: Negative for congestion, sore throat, rhinorrhea, sneezing, trouble swallowing and sinus pressure.   Eyes: Negative for photophobia and visual disturbance.  Respiratory: Negative for cough, chest tightness, shortness of breath, wheezing and stridor.   Cardiovascular: Negative for chest pain, palpitations and leg swelling.  Gastrointestinal: Negative for nausea, vomiting, abdominal pain, diarrhea, constipation, blood in stool, abdominal distention and anal bleeding.  Genitourinary: Negative for dysuria, hematuria, flank pain and difficulty urinating.  Musculoskeletal: Negative for myalgias, back pain, joint swelling, arthralgias and gait problem.  Skin: Negative for color change, pallor, rash and wound.  Neurological: Negative for dizziness, tremors, weakness and light-headedness.  Hematological: Negative for adenopathy. Does not bruise/bleed easily.  Psychiatric/Behavioral: Negative for behavioral problems, confusion, sleep disturbance, dysphoric mood, decreased concentration and agitation.       Objective:   Physical Exam  Constitutional: He is oriented to person, place, and time. He appears well-developed and well-nourished. No distress.  HENT:  Head: Normocephalic and atraumatic.  Mouth/Throat: Oropharynx is clear and moist. No oropharyngeal exudate.  Eyes: Conjunctivae and EOM are normal. Pupils are equal, round, and reactive to light. No scleral icterus.  Neck: Normal range of motion. Neck supple. No JVD present.    Cardiovascular: Normal rate, regular rhythm and normal heart sounds.  Exam reveals no gallop and no friction rub.   No murmur heard. Pulmonary/Chest: Effort normal and breath sounds normal. No respiratory distress. He has no wheezes. He has no rales. He exhibits no tenderness.  Abdominal: He exhibits no distension and no mass. There is no tenderness. There is no rebound and no guarding.  Musculoskeletal: He exhibits no edema and no tenderness.  Lymphadenopathy:    He has no cervical adenopathy.  Neurological: He is alert and oriented to person, place, and time. He has normal reflexes. He exhibits normal muscle tone. Coordination normal.  Skin: Skin is warm and dry. He is not diaphoretic. No erythema. No pallor.  Psychiatric: He has a normal mood and affect. His behavior is normal. Judgment and thought content normal.          Assessment & Plan:  HIV DISEASE Continue reyataz, norvir and truvada  Smoking nicoderm patches rx  ESSENTIAL HYPERTENSION, BENIGN Reasonable control

## 2011-10-06 NOTE — Assessment & Plan Note (Signed)
Continue reyataz, norvir and [place]

## 2011-10-06 NOTE — Assessment & Plan Note (Signed)
nicoderm patches rx

## 2011-10-14 ENCOUNTER — Emergency Department (HOSPITAL_COMMUNITY)
Admission: EM | Admit: 2011-10-14 | Discharge: 2011-10-14 | Disposition: A | Discharge status: Home / Self Care | Payer: Medicare Other | Attending: Emergency Medicine | Admitting: Emergency Medicine

## 2011-10-14 DIAGNOSIS — Z21 Asymptomatic human immunodeficiency virus [HIV] infection status: Secondary | ICD-10-CM | POA: Insufficient documentation

## 2011-10-14 DIAGNOSIS — G8929 Other chronic pain: Secondary | ICD-10-CM | POA: Insufficient documentation

## 2011-10-14 DIAGNOSIS — F17200 Nicotine dependence, unspecified, uncomplicated: Secondary | ICD-10-CM | POA: Insufficient documentation

## 2011-10-14 DIAGNOSIS — M79609 Pain in unspecified limb: Secondary | ICD-10-CM | POA: Insufficient documentation

## 2011-10-14 DIAGNOSIS — Z9889 Other specified postprocedural states: Secondary | ICD-10-CM | POA: Insufficient documentation

## 2011-11-02 ENCOUNTER — Encounter (HOSPITAL_COMMUNITY): Payer: Self-pay | Admitting: *Deleted

## 2011-11-02 ENCOUNTER — Inpatient Hospital Stay (HOSPITAL_COMMUNITY)
Admission: EM | Admit: 2011-11-02 | Discharge: 2011-11-09 | DRG: 579 | Disposition: A | Discharge status: Skilled Nursing Facility | Payer: Medicare Other | Attending: Infectious Diseases | Admitting: Infectious Diseases

## 2011-11-02 ENCOUNTER — Emergency Department (HOSPITAL_COMMUNITY): Payer: Medicare Other

## 2011-11-02 DIAGNOSIS — M86171 Other acute osteomyelitis, right ankle and foot: Secondary | ICD-10-CM | POA: Diagnosis present

## 2011-11-02 DIAGNOSIS — L03031 Cellulitis of right toe: Secondary | ICD-10-CM | POA: Diagnosis present

## 2011-11-02 DIAGNOSIS — Z23 Encounter for immunization: Secondary | ICD-10-CM

## 2011-11-02 DIAGNOSIS — L03039 Cellulitis of unspecified toe: Principal | ICD-10-CM | POA: Diagnosis present

## 2011-11-02 DIAGNOSIS — L02619 Cutaneous abscess of unspecified foot: Principal | ICD-10-CM | POA: Diagnosis present

## 2011-11-02 DIAGNOSIS — F17200 Nicotine dependence, unspecified, uncomplicated: Secondary | ICD-10-CM | POA: Diagnosis present

## 2011-11-02 DIAGNOSIS — D509 Iron deficiency anemia, unspecified: Secondary | ICD-10-CM | POA: Diagnosis present

## 2011-11-02 DIAGNOSIS — M869 Osteomyelitis, unspecified: Secondary | ICD-10-CM

## 2011-11-02 DIAGNOSIS — F319 Bipolar disorder, unspecified: Secondary | ICD-10-CM | POA: Diagnosis present

## 2011-11-02 DIAGNOSIS — K5900 Constipation, unspecified: Secondary | ICD-10-CM | POA: Diagnosis present

## 2011-11-02 DIAGNOSIS — B20 Human immunodeficiency virus [HIV] disease: Secondary | ICD-10-CM | POA: Diagnosis present

## 2011-11-02 HISTORY — DX: Immunodeficiency, unspecified: D84.9

## 2011-11-02 HISTORY — DX: Bipolar disorder, unspecified: F31.9

## 2011-11-02 HISTORY — DX: Essential (primary) hypertension: I10

## 2011-11-02 LAB — DIFFERENTIAL
Basophils Absolute: 0 10*3/uL (ref 0.0–0.1)
Basophils Relative: 0 % (ref 0–1)
Eosinophils Absolute: 0.3 10*3/uL (ref 0.0–0.7)
Eosinophils Relative: 6 % — ABNORMAL HIGH (ref 0–5)
Monocytes Absolute: 0.5 10*3/uL (ref 0.1–1.0)
Monocytes Relative: 9 % (ref 3–12)
Neutro Abs: 3.2 10*3/uL (ref 1.7–7.7)

## 2011-11-02 LAB — CBC
HCT: 40.7 % (ref 39.0–52.0)
Hemoglobin: 12.9 g/dL — ABNORMAL LOW (ref 13.0–17.0)
MCH: 23.7 pg — ABNORMAL LOW (ref 26.0–34.0)
MCHC: 31.7 g/dL (ref 30.0–36.0)
RDW: 14.9 % (ref 11.5–15.5)

## 2011-11-02 LAB — BASIC METABOLIC PANEL
BUN: 12 mg/dL (ref 6–23)
Calcium: 9.4 mg/dL (ref 8.4–10.5)
Creatinine, Ser: 1.32 mg/dL (ref 0.50–1.35)
GFR calc Af Amer: 78 mL/min — ABNORMAL LOW (ref >90)
GFR calc non Af Amer: 67 mL/min — ABNORMAL LOW (ref >90)

## 2011-11-02 MED ORDER — HEPARIN SODIUM (PORCINE) 5000 UNIT/ML IJ SOLN
5000.0000 [IU] | Freq: Three times a day (TID) | INTRAMUSCULAR | Status: DC
  Administered 2011-11-02 – 2011-11-03 (×4): 5000 [IU] via SUBCUTANEOUS
  Filled 2011-11-02 (×8): qty 1

## 2011-11-02 MED ORDER — VANCOMYCIN HCL 10 G IV SOLR
1000.0000 mg | Freq: Once | INTRAVENOUS | Status: DC
  Filled 2011-11-02: qty 1000

## 2011-11-02 MED ORDER — SODIUM CHLORIDE 0.9 % IJ SOLN
3.0000 mL | Freq: Two times a day (BID) | INTRAMUSCULAR | Status: DC
  Administered 2011-11-02 – 2011-11-09 (×4): 3 mL via INTRAVENOUS

## 2011-11-02 MED ORDER — HYDROMORPHONE HCL PF 1 MG/ML IJ SOLN
1.0000 mg | Freq: Once | INTRAMUSCULAR | Status: AC
  Administered 2011-11-02: 1 mg via INTRAVENOUS
  Filled 2011-11-02: qty 1

## 2011-11-02 MED ORDER — ATAZANAVIR SULFATE 150 MG PO CAPS
300.0000 mg | ORAL_CAPSULE | Freq: Every day | ORAL | Status: DC
  Administered 2011-11-03 – 2011-11-09 (×6): 300 mg via ORAL
  Filled 2011-11-02 (×10): qty 2

## 2011-11-02 MED ORDER — PNEUMOCOCCAL VAC POLYVALENT 25 MCG/0.5ML IJ INJ
0.5000 mL | INJECTION | INTRAMUSCULAR | Status: AC
  Administered 2011-11-03: 0.5 mL via INTRAMUSCULAR
  Filled 2011-11-02: qty 0.5

## 2011-11-02 MED ORDER — DIPHENHYDRAMINE HCL 50 MG/ML IJ SOLN
25.0000 mg | Freq: Once | INTRAMUSCULAR | Status: AC
  Administered 2011-11-02: 25 mg via INTRAVENOUS
  Filled 2011-11-02: qty 1

## 2011-11-02 MED ORDER — MORPHINE SULFATE 4 MG/ML IJ SOLN
4.0000 mg | Freq: Once | INTRAMUSCULAR | Status: AC
  Administered 2011-11-02: 4 mg via INTRAVENOUS
  Filled 2011-11-02: qty 1

## 2011-11-02 MED ORDER — SODIUM CHLORIDE 0.9 % IJ SOLN: 3.0000 mL | INTRAMUSCULAR | Status: DC | PRN

## 2011-11-02 MED ORDER — NICOTINE 21 MG/24HR TD PT24
21.0000 mg | MEDICATED_PATCH | Freq: Every day | TRANSDERMAL | Status: DC
  Administered 2011-11-02 – 2011-11-03 (×2): 21 mg via TRANSDERMAL
  Filled 2011-11-02 (×3): qty 1

## 2011-11-02 MED ORDER — VANCOMYCIN HCL IN DEXTROSE 1-5 GM/200ML-% IV SOLN
1000.0000 mg | Freq: Once | INTRAVENOUS | Status: AC
  Administered 2011-11-02: 1000 mg via INTRAVENOUS
  Filled 2011-11-02: qty 200

## 2011-11-02 MED ORDER — VANCOMYCIN HCL 10 G IV SOLR: 15.0000 mg/kg | Freq: Once | INTRAVENOUS | Status: DC

## 2011-11-02 MED ORDER — PIPERACILLIN-TAZOBACTAM 3.375 G IVPB
3.3750 g | Freq: Once | INTRAVENOUS | Status: AC
  Administered 2011-11-02: 3.375 g via INTRAVENOUS
  Filled 2011-11-02: qty 50

## 2011-11-02 MED ORDER — RITONAVIR 100 MG PO TABS
100.0000 mg | ORAL_TABLET | Freq: Every day | ORAL | Status: DC
  Administered 2011-11-02 – 2011-11-03 (×2): 100 mg via ORAL
  Filled 2011-11-02 (×3): qty 1

## 2011-11-02 MED ORDER — EMTRICITABINE-TENOFOVIR DF 200-300 MG PO TABS
1.0000 | ORAL_TABLET | Freq: Every day | ORAL | Status: DC
  Administered 2011-11-02 – 2011-11-03 (×2): 1 via ORAL
  Filled 2011-11-02 (×3): qty 1

## 2011-11-02 MED ORDER — SODIUM CHLORIDE 0.9 % IV SOLN
Freq: Once | INTRAVENOUS | Status: AC
  Administered 2011-11-02: 14:00:00 via INTRAVENOUS

## 2011-11-02 MED ORDER — SODIUM CHLORIDE 0.9 % IV SOLN: 250.0000 mL | INTRAVENOUS | Status: DC

## 2011-11-02 MED ORDER — VANCOMYCIN HCL 1000 MG IV SOLR
750.0000 mg | Freq: Two times a day (BID) | INTRAVENOUS | Status: DC
  Administered 2011-11-03 – 2011-11-05 (×6): 750 mg via INTRAVENOUS
  Filled 2011-11-02 (×6): qty 750

## 2011-11-02 MED ORDER — ZOLPIDEM TARTRATE 5 MG PO TABS
5.0000 mg | ORAL_TABLET | Freq: Once | ORAL | Status: AC
  Administered 2011-11-02: 5 mg via ORAL
  Filled 2011-11-02: qty 1

## 2011-11-02 MED ORDER — DOXYCYCLINE HYCLATE 100 MG PO TABS: 100.0000 mg | ORAL_TABLET | Freq: Two times a day (BID) | ORAL | Status: DC

## 2011-11-02 MED ORDER — HYDROCODONE-ACETAMINOPHEN 10-325 MG PO TABS
1.0000 | ORAL_TABLET | ORAL | Status: DC | PRN
  Administered 2011-11-02 – 2011-11-03 (×5): 1 via ORAL
  Filled 2011-11-02 (×5): qty 1

## 2011-11-02 NOTE — ED Notes (Signed)
Meal tray to pt

## 2011-11-02 NOTE — ED Notes (Signed)
Pt states ht ..68 inches States weight 160 pounds

## 2011-11-02 NOTE — ED Provider Notes (Signed)
History     CSN: [id number] Arrival date & time: 11/02/2011 12:51 PM   First MD Initiated Contact with Patient 11/02/11 1321      Chief Complaint  Patient presents with  . Foot Pain   HPI Presents to emergency room with complaints of right foot pain. Patient reports that he had a 22 his second toe removed 3 weeks ago. Reports that he feels that the pain is increasing in nature and is more swollen. Was seen in the emergency department 2 weeks ago and was treated with pain medication at that time. Patient is HIV positive with CD4 count of 540. Patient denies any other complaints. Patient denies any fevers. Patient denies any nausea or vomiting. Patient denies any trauma.  Past Medical History  Diagnosis Date  . Immune deficiency disorder     History reviewed. No pertinent past surgical history.  No family history on file.  History  Substance Use Topics  . Smoking status: Current Everyday Smoker -- 0.5 packs/day for 15 years  . Smokeless tobacco: Never Used  . Alcohol Use: Yes     rarely      Review of Systems  Constitutional: Negative for fever, chills, diaphoresis and appetite change.  HENT: Negative for neck pain.   Eyes: Negative for photophobia and visual disturbance.  Respiratory: Negative for cough, chest tightness and shortness of breath.   Cardiovascular: Negative for chest pain.  Gastrointestinal: Negative for nausea, vomiting and abdominal pain.  Genitourinary: Negative for flank pain.  Musculoskeletal: Negative for back pain.  Skin: Positive for color change and wound. Negative for rash.  Neurological: Negative for weakness and numbness.  All other systems reviewed and are negative.    Allergies  Nsaids  Home Medications   Current Outpatient Rx  Name Route Sig Dispense Refill  . ATAZANAVIR SULFATE 300 MG PO CAPS Oral Take 1 capsule (300 mg total) by mouth daily with breakfast. 30 capsule 0  . EMTRICITABINE-TENOFOVIR 200-300 MG PO TABS Oral Take 1  tablet by mouth daily. 30 tablet 0  . RITONAVIR 100 MG PO TABS Oral Take 1 tablet (100 mg total) by mouth daily. 30 tablet 11    BP 131/88  Pulse 82  Temp(Src) 98.4 F (36.9 C) (Oral)  Resp 16  Ht 5\' 8"  (1.727 m)  Wt 160 lb (72.576 kg)  BMI 24.33 kg/m2  SpO2 99%  Physical Exam  Nursing note and vitals reviewed. Constitutional: He is oriented to person, place, and time. He appears well-developed and well-nourished. No distress.  HENT:  Head: Normocephalic and atraumatic.  Eyes: EOM are normal. Pupils are equal, round, and reactive to light.  Neck: Normal range of motion. Neck supple.  Cardiovascular: Normal rate and regular rhythm.   Pulmonary/Chest: Effort normal and breath sounds normal.  Abdominal: Soft. Bowel sounds are normal.  Musculoskeletal: He exhibits edema and tenderness.       Swelling and erythema noted at the DIP of the second digit of right foot. Dorsalis pedis and tibialis posterior pulse intact. Pain with movement.   Neurological: He is alert and oriented to person, place, and time.  Skin: Skin is warm and dry. No rash noted. He is not diaphoretic. There is erythema. No pallor.  Psychiatric: He has a normal mood and affect. His behavior is normal. Judgment and thought content normal.    ED Course  Procedures (including critical care time)  2:34 PM discussed with dr. Rubin Payor. Recommended admission.   Patient seen and evaluated.  VSS reviewed. Marland Kitchen  Nursing notes reviewed. Discussed with attending physician. Initial testing ordered. Will monitor the patient closely. They agree with the treatment plan and diagnosis.   3:30 PM patient will be admitted to outpatient clinics. Vss.   MDM  Cellulitis of right second toe        Demetrius Charity, PA 11/02/11 1531

## 2011-11-02 NOTE — ED Notes (Signed)
Pt dinner tray at bedside.  

## 2011-11-02 NOTE — H&P (Signed)
Hospital Admission Note Date: 11/02/2011  Patient name: Gary [name] Medical record number: [id number] Date of birth: 1971/05/02 Age: 40 y.o. Gender: male PCP: Carolin Guernsey, NP, NP  Medical Service: Leitha Bleak (B1) Internal Medicine Teaching Service  Attending physician: Dr. Johny Sax    1st Contact: Dr. [name]  Pager: [phone] 2nd Contact: Dr. Almyra Deforest  Pager: [phone] After 5 pm or weekends: 1st Contact:      Pager: [phone] 2nd Contact:      Pager: [phone]  Chief Complaint: Right 2nd toe pain  History of Present Illness: Patient is a 40 yo man with PMH of HIV (CD4 570, VL <20, 09/22/11) that presents with left 2nd toe sharp/shooting 10+/10 pain starting at the base of the toe for the last 2 weeks.  He reports pain improved to 6/10 after dilaudid in ED.  Pain is worsened with movement, weight bearing and with touch.  He reports that about 1 week PTA, he noticed puss and blood from his right 2nd toe.  He notes that 3.5-4 weeks PTA he had his 2nd toe nail removed because it was black, though he had initially gone to the doctor for a right foot bunyon removal.  He reports that he was instructed to clean 2nd toe with peroxide and treat pain with tylenol.  He came to the ED about 2 weeks PTA where he was given Keflex and pain medication.  He reports finishing abx about 1 week ago.  He denies fever/chills.    Upon calling pt's pharmacy, it seems he filled his Keflex on 10/26/11, for a 10 day course, so it is not possible for him to have completed his course.   Meds: Medications Prior to Admission  Medication Dose Route Frequency Provider Last Rate Last Dose  . 0.9 %  sodium chloride infusion   Intravenous Once [employer], PA 75 mL/hr at 11/02/11 1424    . HYDROmorphone (DILAUDID) injection 1 mg  1 mg Intravenous Once [employer], PA   1 mg at 11/02/11 1459  . HYDROmorphone (DILAUDID) injection 1 mg  1 mg Intravenous Once [employer], PA   1 mg at  11/02/11 1551  . morphine 4 MG/ML injection 4 mg  4 mg Intravenous Once [employer]      . piperacillin-tazobactam (ZOSYN) IVPB 3.375 g  3.375 g Intravenous Once [employer], PA   3.375 g at 11/02/11 1425  . vancomycin (VANCOCIN) IVPB 1000 mg/200 mL premix  1,000 mg Intravenous Once Stasia Cavalier, RPH   1,000 mg at 11/02/11 1503  . DISCONTD: doxycycline (VIBRA-TABS) tablet 100 mg  100 mg Oral Q12H Jaseela Illath      . DISCONTD: vancomycin (VANCOCIN) injection 1,000 mg  1,000 mg Intravenous Once Meera Krishnavadan Patel, [facility]      . DISCONTD: vancomycin (VANCOCIN) injection 15 mg/kg  15 mg/kg Intravenous Once [employer], PA       Medications Prior to Admission  Medication Sig Dispense Refill  . atazanavir (REYATAZ) 300 MG capsule Take 1 capsule (300 mg total) by mouth daily with breakfast.  30 capsule  0  . emtricitabine-tenofovir (TRUVADA) 200-300 MG per tablet Take 1 tablet by mouth daily.  30 tablet  0  . ritonavir (NORVIR) 100 MG TABS Take 1 tablet (100 mg total) by mouth daily.  30 tablet  11   Allergies: Nsaids (itching) and Ultram (rash)  Past Medical History  Diagnosis Date  . Immune deficiency disorder   . Bipolar disorder   .  Hypertension   . Sickle cell trait    Past Surgical History  Procedure Date  . Cervical fusion    No family history on file.  History   Social History  . Marital Status: Married    Spouse Name: N/A    Number of Children: N/A  . Years of Education: N/A   Occupational History  . disability     Social History Main Topics  . Smoking status: Current Everyday Smoker -- 0.5 packs/day for 15 years  . Smokeless tobacco: Never Used  . Alcohol Use: Yes     rarely  . Drug Use: Yes     marijuana  . Sexually Active: Not on file     pt. given condoms   Other Topics Concern  . Not on file   Social History Narrative  . No narrative on file    Review of Systems: General: no fevers, chills, changes in weight, changes in  appetite Skin: no rash HEENT: no blurry vision, hearing changes, sore throat Pulm: no dyspnea, coughing, wheezing CV: no chest pain, palpitations, shortness of breath Abd: no abdominal pain, nausea/vomiting, diarrhea/constipation GU: no dysuria, hematuria, polyuria Ext: no arthralgias, myalgias Neuro: no weakness, numbness, or tingling  Physical Exam: Blood pressure 131/88, pulse 60, temperature 98.4 F (36.9 C), temperature source Oral, resp. rate 20, height 5\' 8"  (1.727 m), weight 160 lb (72.576 kg), SpO2 99.00%. General: resting in bed, NAD HEENT: PERRL, EOMI, no scleral icterus, no conjunctival pallor Cardiac: RRR, no rubs, murmurs or gallops Pulm: clear to auscultation bilaterally, moving normal volumes of air Abd: soft, nontender, nondistended, BS present Ext: warm and well perfused, no pedal edema, right 2nd toe erythematous extending to base of toe, mild edema of right 2nd toe, tender to palpation, especially at tip of toe, decreased ROM of right 2nd toe likely 2/2 pain Neuro: alert and oriented X3, cranial nerves II-XII grossly intact, strength and sensation to light touch equal in bilateral upper and lower extremities  Lab results: Basic Metabolic Panel:  Basename 11/02/11 1349  NA 142  K 4.5  CL 107  CO2 29  GLUCOSE 94  BUN 12  CREATININE 1.32  CALCIUM 9.4  MG --  PHOS --   CBC:  Basename 11/02/11 1349  WBC 6.2  NEUTROABS 3.2  HGB 12.9*  HCT 40.7  MCV 74.7*  PLT 255   Imaging results:  Dg Foot Complete Right  11/02/2011  *RADIOLOGY REPORT*  Clinical Data: Evaluate for osteomyelitis  RIGHT FOOT COMPLETE - 3+ VIEW  Comparison: None  Findings: There is no evidence of fracture or dislocation. Hallux valgus deformity noted.  There is no evidence of arthropathy or other focal bone abnormality. Diffuse soft tissue swelling surrounds the second distal phalanx.  IMPRESSION:  1.  No evidence for osteomyelitis. 2.  Soft tissue swelling.  Original Report Authenticated  By: Rosealee Albee, M.D.   Assessment & Plan by Problem:  #Right 2nd toe pain: Likely 2/2 cellulitis, especially given recent history of surgery to the area.  Fracture unlikely given x-ray findings.  Will need to rule out osteomyelitis.   Plan: MRI of right foot, treat with IV vancomycin, pain mgmt with Vicodin 10-325  #Microcytic Anemia: Asymptomatic. Patient seems to be around at baseline and is borderline anemic.  Will monitor with AM CBC before considering further mgmt.  May need outpatient workup.   #HIV: Stable as of 09/22/11.  Patient reports compliance with medications Plan: Continue home meds- Norvir, Reyataz, Truvada  #VTE Prophylaxis:  Heparin 5000u q8h  #Tobacco Abuse: Will continue to discuss with patient and consider smoking cessation consult Plan: start Nicotine 21mg  patch daily  Signed: [name] 11/02/2011, 6:43 PM  Internal Medicine Teaching Service Attending Note Date: 11/03/2011  Patient name: Araceli Coufal  Medical record number: [id number]  Date of birth: 09/05/1971    This patient has been seen and discussed with the house staff. Please see their note for complete details. I concur with their findings with the following additions/corrections: Please see my note as well.  Johny Sax 11/03/2011, 11:34 AM

## 2011-11-02 NOTE — ED Notes (Signed)
Pt c/o itching from pain medication (morphine earlier) there is no rash or other signs of allergic reaction noted. Resident on call for internal medicine called and orders benadryl IV. Order executed. Will attempt to call report.

## 2011-11-02 NOTE — ED Notes (Signed)
To ed for further eval of right great toe pain. States he had his toenail removed by md in [place] a few weeks ago, then came to ed for care due to infection and given abx, states he is now out of abx but not better. Pt is ambulatory.

## 2011-11-02 NOTE — ED Provider Notes (Addendum)
Patient with great toe pain and infection. Had his toenail removed in [facility]. Has HIV. He'll be admitted. He's been on antibiotics but did not improve. He is now out of medicines.  Gary [name]. Rubin Payor, MD 11/02/11 2000  Gary [name]. Rubin Payor, MD 11/02/11 2001

## 2011-11-02 NOTE — ED Notes (Signed)
Dinner ordered for pt

## 2011-11-02 NOTE — Progress Notes (Signed)
ANTIBIOTIC CONSULT NOTE - INITIAL  Pharmacy Consult for Vancomycin Indication: right second toe foot cellutitis  Allergies  Allergen Reactions  . Nsaids Rash    Patient Measurements: Height: 5\' 8"  (172.7 cm) Weight: 160 lb (72.576 kg) IBW/kg (Calculated) : 68.4  Adjusted Body Weight:  Vital Signs: Temp: 98.4 F (36.9 C) (11/13 1258) Temp src: Oral (11/13 1258) BP: 131/88 mmHg (11/13 1258) Pulse Rate: 82  (11/13 1258) Intake/Output from previous day:   Intake/Output from this shift:    Labs:  Basename 11/02/11 1349  WBC 6.2  HGB 12.9*  PLT 255  LABCREA --  CREATININE 1.32   Estimated Creatinine Clearance: 73.4 ml/min (by C-G formula based on Cr of 1.32). No results found for this basename: VANCOTROUGH:2,VANCOPEAK:2,VANCORANDOM:2,GENTTROUGH:2,GENTPEAK:2,GENTRANDOM:2,TOBRATROUGH:2,TOBRAPEAK:2,TOBRARND:2,AMIKACINPEAK:2,AMIKACINTROU:2,AMIKACIN:2, in the last 72 hours   Microbiology: No results found for this or any previous visit (from the past 720 hour(s)).  Medical History: Past Medical History  Diagnosis Date  . Immune deficiency disorder     Medications:   (Not in a hospital admission) Assessment: Patient is a 40 y.o. admitted today to the ED for suspected toe cellulitis.  Patient received vancomycin 1 gm in the ED at 1500 and zosyn 3.375 gm IV at 1425.  Goal of Therapy:  Vancomycin trough 10-15  Plan:  1) vancomycin 750mg  IV q12h (next dose due at 0300 on 11/03/11) 2) will monitor renal function and adjust dose as needed  Juanna Pudlo P 11/02/2011,3:25 PM

## 2011-11-03 ENCOUNTER — Inpatient Hospital Stay (HOSPITAL_COMMUNITY): Payer: Medicare Other

## 2011-11-03 DIAGNOSIS — L02619 Cutaneous abscess of unspecified foot: Principal | ICD-10-CM

## 2011-11-03 LAB — CBC
HCT: 37.6 % — ABNORMAL LOW (ref 39.0–52.0)
MCH: 23.4 pg — ABNORMAL LOW (ref 26.0–34.0)
MCV: 74.5 fL — ABNORMAL LOW (ref 78.0–100.0)
Platelets: 225 10*3/uL (ref 150–400)
RDW: 14.8 % (ref 11.5–15.5)

## 2011-11-03 LAB — URIC ACID: Uric Acid, Serum: 4.3 mg/dL (ref 4.0–7.8)

## 2011-11-03 LAB — RETICULOCYTES
Retic Count, Absolute: 76.8 10*3/uL (ref 19.0–186.0)
Retic Ct Pct: 1.5 % (ref 0.4–3.1)

## 2011-11-03 LAB — FERRITIN: Ferritin: 110 ng/mL (ref 22–322)

## 2011-11-03 LAB — IRON AND TIBC
TIBC: 275 ug/dL (ref 215–435)
UIBC: 231 ug/dL (ref 125–400)

## 2011-11-03 MED ORDER — DIPHENHYDRAMINE HCL 25 MG PO CAPS
25.0000 mg | ORAL_CAPSULE | ORAL | Status: DC | PRN
  Administered 2011-11-03 – 2011-11-06 (×11): 25 mg via ORAL
  Filled 2011-11-03 (×11): qty 1

## 2011-11-03 MED ORDER — ZOLPIDEM TARTRATE 5 MG PO TABS
5.0000 mg | ORAL_TABLET | Freq: Once | ORAL | Status: AC
  Administered 2011-11-03: 5 mg via ORAL
  Filled 2011-11-03: qty 1

## 2011-11-03 MED ORDER — DIPHENHYDRAMINE HCL 25 MG PO CAPS
25.0000 mg | ORAL_CAPSULE | Freq: Once | ORAL | Status: AC
  Administered 2011-11-03: 25 mg via ORAL
  Filled 2011-11-03: qty 1

## 2011-11-03 MED ORDER — GADOBENATE DIMEGLUMINE 529 MG/ML IV SOLN
15.0000 mL | Freq: Once | INTRAVENOUS | Status: AC
  Administered 2011-11-03: 15 mL via INTRAVENOUS

## 2011-11-03 MED ORDER — PIPERACILLIN-TAZOBACTAM 3.375 G IVPB
3.3750 g | Freq: Three times a day (TID) | INTRAVENOUS | Status: DC
  Administered 2011-11-03 – 2011-11-09 (×18): 3.375 g via INTRAVENOUS
  Filled 2011-11-03 (×19): qty 50

## 2011-11-03 MED ORDER — MORPHINE SULFATE 2 MG/ML IJ SOLN
1.0000 mg | INTRAMUSCULAR | Status: DC | PRN
  Administered 2011-11-03 – 2011-11-05 (×7): 1 mg via INTRAVENOUS
  Filled 2011-11-03 (×7): qty 1

## 2011-11-03 NOTE — Consult Note (Signed)
Reason for Consult: Right toe infection Referring Physician: Dr. Nonda Lou is an 40 y.o. male.  HPI: Taken Schamp is a 40 year old patient with right great toe infection he had a fungal infection in his second toenail on the right foot approximately month ago. This was surgically removed. Patient's had persistent pain swelling and drainage since that time. He is currently admitted for further workup and evaluation. Patient's MRI scan is reviewed as part of this examination. He denies any other problems on the right foot. He does report left small toe fungal infection. Patient denies any trauma to the right foot which would precipitate adverse outcome from the toenail removal. The patient and place on taken Zosyn in the hospital. Past Medical History  Diagnosis Date  . Immune deficiency disorder   . Bipolar disorder   . Hypertension   . Sickle cell trait     Past Surgical History  Procedure Date  . Cervical fusion     No family history on file.  Social History:  reports that he has been smoking.  He has never used smokeless tobacco. He reports that he drinks alcohol. He reports that he uses illicit drugs.  Allergies:  Allergies  Allergen Reactions  . Nsaids Itching  . Ultram (Tramadol Hcl) Rash    Medications: I have reviewed the patient's current medications.  Results for orders placed during the hospital encounter of 11/02/11 (from the past 48 hour(s))  CBC     Status: Abnormal   Collection Time   11/02/11  1:49 PM      Component Value Range Comment   WBC 6.2  4.0 - 10.5 (K/uL)    RBC 5.45  4.22 - 5.81 (MIL/uL)    Hemoglobin 12.9 (*) 13.0 - 17.0 (g/dL)    HCT 16.1  09.6 - 04.5 (%)    MCV 74.7 (*) 78.0 - 100.0 (fL)    MCH 23.7 (*) 26.0 - 34.0 (pg)    MCHC 31.7  30.0 - 36.0 (g/dL)    RDW 40.9  81.1 - 91.4 (%)    Platelets 255  150 - 400 (K/uL)   DIFFERENTIAL     Status: Abnormal   Collection Time   11/02/11  1:49 PM      Component Value Range Comment   Neutrophils Relative 51  43 - 77 (%)    Neutro Abs 3.2  1.7 - 7.7 (K/uL)    Lymphocytes Relative 35  12 - 46 (%)    Lymphs Abs 2.2  0.7 - 4.0 (K/uL)    Monocytes Relative 9  3 - 12 (%)    Monocytes Absolute 0.5  0.1 - 1.0 (K/uL)    Eosinophils Relative 6 (*) 0 - 5 (%)    Eosinophils Absolute 0.3  0.0 - 0.7 (K/uL)    Basophils Relative 0  0 - 1 (%)    Basophils Absolute 0.0  0.0 - 0.1 (K/uL)   BASIC METABOLIC PANEL     Status: Abnormal   Collection Time   11/02/11  1:49 PM      Component Value Range Comment   Sodium 142  135 - 145 (mEq/L)    Potassium 4.5  3.5 - 5.1 (mEq/L)    Chloride 107  96 - 112 (mEq/L)    CO2 29  19 - 32 (mEq/L)    Glucose, Bld 94  70 - 99 (mg/dL)    BUN 12  6 - 23 (mg/dL)    Creatinine, Ser 7.82  0.50 - 1.35 (  mg/dL)    Calcium 9.4  8.4 - 10.5 (mg/dL)    GFR calc non Af Amer 67 (*) >90 (mL/min)    GFR calc Af Amer 78 (*) >90 (mL/min)   CBC     Status: Abnormal   Collection Time   11/03/11  5:57 AM      Component Value Range Comment   WBC 6.2  4.0 - 10.5 (K/uL)    RBC 5.05  4.22 - 5.81 (MIL/uL)    Hemoglobin 11.8 (*) 13.0 - 17.0 (g/dL)    HCT 16.1 (*) 09.6 - 52.0 (%)    MCV 74.5 (*) 78.0 - 100.0 (fL)    MCH 23.4 (*) 26.0 - 34.0 (pg)    MCHC 31.4  30.0 - 36.0 (g/dL)    RDW 04.5  40.9 - 81.1 (%)    Platelets 225  150 - 400 (K/uL)   IRON AND TIBC     Status: Abnormal   Collection Time   11/03/11 10:19 AM      Component Value Range Comment   Iron 44  42 - 135 (ug/dL)    TIBC 914  782 - 956 (ug/dL)    Saturation Ratios 16 (*) 20 - 55 (%)    UIBC 231  125 - 400 (ug/dL)   RETICULOCYTES     Status: Normal   Collection Time   11/03/11 10:19 AM      Component Value Range Comment   Retic Ct Pct 1.5  0.4 - 3.1 (%)    RBC. 5.12  4.22 - 5.81 (MIL/uL)    Retic Count, Manual 76.8  19.0 - 186.0 (K/uL)   URIC ACID     Status: Normal   Collection Time   11/03/11 11:35 AM      Component Value Range Comment   Uric Acid, Serum 4.3  4.0 - 7.8 (mg/dL)     Mr  Foot Right W Wo Contrast  11/03/2011  *RADIOLOGY REPORT*  Clinical Data: Redness and swelling of the right second toe. Question osteomyelitis.  MRI OF THE RIGHT FOREFOOT WITHOUT AND WITH CONTRAST  Technique:  Multiplanar, multisequence MR imaging was performed both before and after administration of intravenous contrast.  Contrast: 15mL MULTIHANCE GADOBENATE DIMEGLUMINE 529 MG/ML IV SOLN  Comparison: Plain films 11/02/2011.  Findings: There is edema with some subcutaneous enhancement about the distal phalanx of the second toe.  There is a small focus of edema and enhancement in the tuft of the second toe.  Bone marrow signal is otherwise unremarkable.  No abscess is identified.  There is no fracture.  Hallux valgus deformity is noted.  IMPRESSION:  1.  Cellulitis about the distal phalanx of the second toe with a very small focus of mild marrow edema and enhancement in the tuft compatible with osteomyelitis. 2.  Hallux valgus.  Original Report Authenticated By: Bernadene Bell. D'ALESSIO, M.D.   Dg Foot Complete Right  11/02/2011  *RADIOLOGY REPORT*  Clinical Data: Evaluate for osteomyelitis  RIGHT FOOT COMPLETE - 3+ VIEW  Comparison: None  Findings: There is no evidence of fracture or dislocation. Hallux valgus deformity noted.  There is no evidence of arthropathy or other focal bone abnormality. Diffuse soft tissue swelling surrounds the second distal phalanx.  IMPRESSION:  1.  No evidence for osteomyelitis. 2.  Soft tissue swelling.  Original Report Authenticated By: Rosealee Albee, M.D.    Review of Systems  Musculoskeletal: Positive for joint pain.  All other systems reviewed and are negative.   Blood pressure  95/31, pulse 64, temperature 97.9 F (36.6 C), temperature source Oral, resp. rate 18, height 5\' 8"  (1.727 m), weight 72.576 kg (160 lb), SpO2 98.00%. Physical Exam on physical examination today chest has normal gait and alignment palpable pedal pulses on the right hand side he has palpable  intact nontender anterior tib posterior tib peroneal and Achilles tendons. Sensation is intact on the dorsum and plantar aspect of the foot the right second toe distal phalanx is swollen there is drainage from the dorsal nail plate area. Toe flexion and extension is intact the toe itself is tender to palpation.  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 This has occurred since his second toenail was removed for fungal infection. The patient is on IV antibiotics. He will require bony debridement of the distal phalanx in order to eradicate the infection. The risks and benefits of the procedure discussed with patient including but not limited to persistent infection need for more surgery incomplete healing and persistent pain patient understands the risk and benefits of the procedure and desires correction of his clinical problem all questions are answered medical decision making his coffee about decision for surgery in the setting of significant multiplicity of past medical problems.   Stefon Ramthun SCOTT 11/03/2011, 6:09 PM

## 2011-11-03 NOTE — Progress Notes (Signed)
Subjective: Didn't sleep well, felt anxious and itchy overnight.  Pain moderately managed (currently 5-6/10).  No acute changes overnight.  Afebrile.    Objective: Vital signs in last 24 hours: Filed Vitals:   11/02/11 1609 11/02/11 1857 11/02/11 2049 11/03/11 0609  BP:  117/80 138/94 126/81  Pulse: 60 60 58 71  Temp:   97.7 F (36.5 C)   TempSrc:   Oral Oral  Resp: 20  20 20   Height:      Weight:      SpO2: 99% 100% 100% 99%   Weight change:  No intake or output data in the 24 hours ending 11/03/11 1610  Physical Exam: General: comfortable in wheelchair, patient about to go for MRI HEENT: PERRL, EOMI, no scleral icterus, no conjunctival pallor  Cardiac: RRR, no rubs, murmurs or gallops  Pulm: clear to auscultation bilaterally, moving normal volumes of air  Abd: soft, nontender, nondistended, BS normoactive Ext: warm and well perfused, no pedal edema, right 2nd toe erythematous extending to base of toe, mild edema of right 2nd toe, tender to palpation, especially at tip of toe, decreased ROM of right 2nd toe likely 2/2 pain, no change from admission Neuro: alert and oriented X3, cranial nerves II-XII grossly intact, strength and sensation to light touch equal in bilateral upper and lower extremities   Lab Results: Basic Metabolic Panel:  Lab 11/02/11 9604  NA 142  K 4.5  CL 107  CO2 29  GLUCOSE 94  BUN 12  CREATININE 1.32  CALCIUM 9.4  MG --  PHOS --   CBC:  Lab 11/03/11 0557 11/02/11 1349  WBC 6.2 6.2  NEUTROABS -- 3.2  HGB 11.8* 12.9*  HCT 37.6* 40.7  MCV 74.5* 74.7*  PLT 225 255   Dg Foot Complete Right  11/02/2011  *RADIOLOGY REPORT*  Clinical Data: Evaluate for osteomyelitis  RIGHT FOOT COMPLETE - 3+ VIEW  Comparison: None  Findings: There is no evidence of fracture or dislocation. Hallux valgus deformity noted.  There is no evidence of arthropathy or other focal bone abnormality. Diffuse soft tissue swelling surrounds the second distal phalanx.   IMPRESSION:  1.  No evidence for osteomyelitis. 2.  Soft tissue swelling.  Original Report Authenticated By: Rosealee Albee, M.D.   Medications: I have reviewed the patient's current medications. Scheduled Meds:   . sodium chloride   Intravenous Once  . atazanavir  300 mg Oral Q breakfast  . diphenhydrAMINE  25 mg Oral Once  . diphenhydrAMINE  25 mg Intravenous Once  . emtricitabine-tenofovir  1 tablet Oral Daily  . heparin  5,000 Units Subcutaneous Q8H  .  HYDROmorphone (DILAUDID) injection  1 mg Intravenous Once  .  HYDROmorphone (DILAUDID) injection  1 mg Intravenous Once  .  HYDROmorphone (DILAUDID) injection  1 mg Intravenous Once  .  morphine injection  4 mg Intravenous Once  . nicotine  21 mg Transdermal Daily  . piperacillin-tazobactam (ZOSYN)  IV  3.375 g Intravenous Once  . pneumococcal 23 valent vaccine  0.5 mL Intramuscular Tomorrow-1000  . ritonavir  100 mg Oral Daily  . sodium chloride  3 mL Intravenous Q12H  . vancomycin  750 mg Intravenous Q12H  . vancomycin  1,000 mg Intravenous Once  . zolpidem  5 mg Oral Once  . DISCONTD: doxycycline  100 mg Oral Q12H  . DISCONTD: vancomycin  1,000 mg Intravenous Once  . DISCONTD: vancomycin  15 mg/kg Intravenous Once   Continuous Infusions:   . sodium chloride  PRN Meds:.HYDROcodone-acetaminophen, sodium chloride  Assessment/Plan: #Right 2nd toe pain: Likely 2/2 cellulitis, especially given recent history of surgery to the area. Fracture unlikely given x-ray findings. Will need to rule out osteomyelitis.  Plan: MRI of right foot pending, continue IV vancomycin (will plan to narrow Abx once MRI results return), pain mgmt with Vicodin 10-325  #Microcytic Anemia: Asymptomatic. Trending down- may be dilutional since pt getting IVF with IV Vanc.  Patient seems to be around at baseline and is borderline anemic.  May be 2/2 HIV.   Plan: Anemia Panal  #HIV: Stable as of 09/22/11. Patient reports compliance with medications    Plan: Continue home meds- Norvir, Reyataz, Truvada   #VTE Prophylaxis: Heparin 5000u q8h   #Tobacco Abuse: Will continue to discuss with patient   Plan: start Nicotine 21mg  patch daily, Smoking cessation consult   LOS: 1 day   KAPADIA, Mattthew Ziomek 11/03/2011, 9:22 AM

## 2011-11-03 NOTE — Progress Notes (Signed)
ANTIBIOTIC CONSULT NOTE - INITIAL  Pharmacy Consult for Zosyn Indication: Osteomyelitis L 2nd toe  Allergies  Allergen Reactions  . Nsaids Itching  . Ultram (Tramadol Hcl) Rash    Patient Measurements: Height: 5\' 8"  (172.7 cm) Weight: 160 lb (72.576 kg) IBW/kg (Calculated) : 68.4    Vital Signs: Temp: 97.9 F (36.6 C) (11/14 1300) Temp src: Oral (11/14 1300) BP: 95/31 mmHg (11/14 1300) Pulse Rate: 64  (11/14 1300)  Labs:  Basename 11/03/11 0557 11/02/11 1349  WBC 6.2 6.2  HGB 11.8* 12.9*  PLT 225 255  LABCREA -- --  CREATININE -- 1.32   Estimated Creatinine Clearance: 73.4 ml/min (by C-G formula based on Cr of 1.32).   Microbiology: No results found for this or any previous visit (from the past 720 hour(s)).  Medical History: Past Medical History  Diagnosis Date  . Immune deficiency disorder   . Bipolar disorder   . Hypertension   . Sickle cell trait     Assessment: 40 YO HIV positive male on Vancomycin D#2 for osteomyelitis of left 2nd toe, now to add zosyn to cover gram negative organisms empirically. Pt. Est. crcl ~70s  Plan:  Start Zosyn 3.375g IV q8hr  Riki Rusk 11/03/2011,5:11 PM

## 2011-11-03 NOTE — Progress Notes (Signed)
Internal Medicine Teaching Service Attending Note Date: 11/03/2011  Patient name: Gary [name]  Medical record number: [id number]  Date of birth: January 03, 1971   I have seen and evaluated Gary [name] and discussed their care with the Residency Team.   40 yo M with HIV+ for roughly the last 5 years, has been maintained on ATVr/TRV during this time. (CD4 570, VL <20, 09/22/11). He denies difficulty taking his ART, missed or skipped doses.  Roughly2 weeks ago he had the nail removed from his L 2nd toe. Afterwards he had increasing pain in his toe. By 1 week ago he developed d/c and blood from his wound. He was seen in the ED and given a Rx for keflex. He started this roughly on 10-26-11. His wound discharge abated but he continued to have severe pain in his toe. He denies fevers, proximal erythema or streaking.   Physical Exam: Blood pressure 126/81, pulse 71, temperature 97.7 F (36.5 C), temperature source Oral, resp. rate 20, height 5\' 8"  (1.727 m), weight 72.576 kg (160 lb), SpO2 99.00%. BP 126/81  Pulse 71  Temp(Src) 97.7 F (36.5 C) (Oral)  Resp 20  Ht 5\' 8"  (1.727 m)  Wt 72.576 kg (160 lb)  BMI 24.33 kg/m2  SpO2 99%  General Appearance:    Alert, cooperative, no distress, appears stated age  Head:    Normocephalic, without obvious abnormality, atraumatic  Eyes:    PERRL, conjunctiva/corneas clear, EOM's intact               Throat:   Lips, mucosa, and tongue normal; teeth and gums normal  Neck:   Supple, symmetrical, trachea midline, no adenopathy;            Lungs:     Clear to auscultation bilaterally, respirations unlabored     Heart:    Regular rate and rhythm, S1 and S2 normal, no murmur,    Abdomen:     Soft, non-tender, bowel sounds active            Extremities:   There is swelling of his L 2nd toe distally. There is no d/c from the wound at his toe nail bed. There is no proximal erythema extending from the toe. The toe is extremely tender.          Lymph  nodes:   Cervical, supraclavicular, nodes normal   Neurologic:   CNII-XII intact.      Lab results: Results for orders placed during the hospital encounter of 11/02/11 (from the past 24 hour(s))  CBC     Status: Abnormal   Collection Time   11/02/11  1:49 PM      Component Value Range   WBC 6.2  4.0 - 10.5 (K/uL)   RBC 5.45  4.22 - 5.81 (MIL/uL)   Hemoglobin 12.9 (*) 13.0 - 17.0 (g/dL)   HCT 40.9  81.1 - 91.4 (%)   MCV 74.7 (*) 78.0 - 100.0 (fL)   MCH 23.7 (*) 26.0 - 34.0 (pg)   MCHC 31.7  30.0 - 36.0 (g/dL)   RDW 78.2  95.6 - 21.3 (%)   Platelets 255  150 - 400 (K/uL)  DIFFERENTIAL     Status: Abnormal   Collection Time   11/02/11  1:49 PM      Component Value Range   Neutrophils Relative 51  43 - 77 (%)   Neutro Abs 3.2  1.7 - 7.7 (K/uL)   Lymphocytes Relative 35  12 - 46 (%)   Lymphs Abs  2.2  0.7 - 4.0 (K/uL)   Monocytes Relative 9  3 - 12 (%)   Monocytes Absolute 0.5  0.1 - 1.0 (K/uL)   Eosinophils Relative 6 (*) 0 - 5 (%)   Eosinophils Absolute 0.3  0.0 - 0.7 (K/uL)   Basophils Relative 0  0 - 1 (%)   Basophils Absolute 0.0  0.0 - 0.1 (K/uL)  BASIC METABOLIC PANEL     Status: Abnormal   Collection Time   11/02/11  1:49 PM      Component Value Range   Sodium 142  135 - 145 (mEq/L)   Potassium 4.5  3.5 - 5.1 (mEq/L)   Chloride 107  96 - 112 (mEq/L)   CO2 29  19 - 32 (mEq/L)   Glucose, Bld 94  70 - 99 (mg/dL)   BUN 12  6 - 23 (mg/dL)   Creatinine, Ser 4.09  0.50 - 1.35 (mg/dL)   Calcium 9.4  8.4 - 81.1 (mg/dL)   GFR calc non Af Amer 67 (*) >90 (mL/min)   GFR calc Af Amer 78 (*) >90 (mL/min)  CBC     Status: Abnormal   Collection Time   11/03/11  5:57 AM      Component Value Range   WBC 6.2  4.0 - 10.5 (K/uL)   RBC 5.05  4.22 - 5.81 (MIL/uL)   Hemoglobin 11.8 (*) 13.0 - 17.0 (g/dL)   HCT 91.4 (*) 78.2 - 52.0 (%)   MCV 74.5 (*) 78.0 - 100.0 (fL)   MCH 23.4 (*) 26.0 - 34.0 (pg)   MCHC 31.4  30.0 - 36.0 (g/dL)   RDW 95.6  21.3 - 08.6 (%)   Platelets 225  150  - 400 (K/uL)  RETICULOCYTES     Status: Normal   Collection Time   11/03/11 10:19 AM      Component Value Range   Retic Ct Pct 1.5  0.4 - 3.1 (%)   RBC. 5.12  4.22 - 5.81 (MIL/uL)   Retic Count, Manual 76.8  19.0 - 186.0 (K/uL)    Imaging results:  Mr Foot Right W Wo Contrast  11/03/2011  *RADIOLOGY REPORT*  Clinical Data: Redness and swelling of the right second toe. Question osteomyelitis.  MRI OF THE RIGHT FOREFOOT WITHOUT AND WITH CONTRAST  Technique:  Multiplanar, multisequence MR imaging was performed both before and after administration of intravenous contrast.  Contrast: 15mL MULTIHANCE GADOBENATE DIMEGLUMINE 529 MG/ML IV SOLN  Comparison: Plain films 11/02/2011.  Findings: There is edema with some subcutaneous enhancement about the distal phalanx of the second toe.  There is a small focus of edema and enhancement in the tuft of the second toe.  Bone marrow signal is otherwise unremarkable.  No abscess is identified.  There is no fracture.  Hallux valgus deformity is noted.  IMPRESSION:  1.  Cellulitis about the distal phalanx of the second toe with a very small focus of mild marrow edema and enhancement in the tuft compatible with osteomyelitis. 2.  Hallux valgus.  Original Report Authenticated By: Bernadene Bell. D'ALESSIO, M.D.   Dg Foot Complete Right  11/02/2011  *RADIOLOGY REPORT*  Clinical Data: Evaluate for osteomyelitis  RIGHT FOOT COMPLETE - 3+ VIEW  Comparison: None  Findings: There is no evidence of fracture or dislocation. Hallux valgus deformity noted.  There is no evidence of arthropathy or other focal bone abnormality. Diffuse soft tissue swelling surrounds the second distal phalanx.  IMPRESSION:  1.  No evidence for osteomyelitis. 2.  Soft  tissue swelling.  Original Report Authenticated By: Rosealee Albee, M.D.    Assessment and Plan: I agree with the formulated Assessment and Plan with the following changes: Osteomyelitis L 2nd toe- unfortunately we do not have Cx data and  a bone bx of his toe would be very destructive. Would continue him on vanco and add coverage for GNR. He may need prolonged IV therapy. HIV+ he is doing very well on his current regimen.  His flu and pneumo vax are up to date. Bipolar- he appears to be doing well currently. His mood and affect appear normal.  Tobacco- will encourage him to quit smoking.

## 2011-11-04 ENCOUNTER — Encounter (HOSPITAL_COMMUNITY)
Admission: EM | Disposition: A | Discharge status: Skilled Nursing Facility | Payer: Self-pay | Source: Home / Self Care | Attending: Infectious Diseases

## 2011-11-04 ENCOUNTER — Inpatient Hospital Stay (HOSPITAL_COMMUNITY): Payer: Medicare Other | Admitting: Certified Registered Nurse Anesthetist

## 2011-11-04 ENCOUNTER — Encounter (HOSPITAL_COMMUNITY): Payer: Self-pay | Admitting: Certified Registered Nurse Anesthetist

## 2011-11-04 DIAGNOSIS — M86171 Other acute osteomyelitis, right ankle and foot: Secondary | ICD-10-CM | POA: Diagnosis present

## 2011-11-04 HISTORY — PX: AMPUTATION: SHX166

## 2011-11-04 SURGERY — AMPUTATION DIGIT
Anesthesia: General | Site: Toe | Laterality: Right | Wound class: Dirty or Infected

## 2011-11-04 MED ORDER — RITONAVIR 100 MG PO TABS
100.0000 mg | ORAL_TABLET | Freq: Every day | ORAL | Status: DC
  Administered 2011-11-05 – 2011-11-09 (×6): 100 mg via ORAL
  Filled 2011-11-04 (×6): qty 1

## 2011-11-04 MED ORDER — NICOTINE 21 MG/24HR TD PT24
21.0000 mg | MEDICATED_PATCH | Freq: Every day | TRANSDERMAL | Status: DC
  Administered 2011-11-05: 21 mg via TRANSDERMAL
  Filled 2011-11-04 (×2): qty 1

## 2011-11-04 MED ORDER — EMTRICITABINE-TENOFOVIR DF 200-300 MG PO TABS
1.0000 | ORAL_TABLET | Freq: Every day | ORAL | Status: DC
  Administered 2011-11-05 – 2011-11-09 (×6): 1 via ORAL
  Filled 2011-11-04 (×6): qty 1

## 2011-11-04 MED ORDER — EMTRICITABINE-TENOFOVIR DF 200-300 MG PO TABS
1.0000 | ORAL_TABLET | Freq: Every day | ORAL | Status: DC
  Filled 2011-11-04: qty 1

## 2011-11-04 SURGICAL SUPPLY — 60 items
BANDAGE CONFORM 2  STR LF (GAUZE/BANDAGES/DRESSINGS) IMPLANT
BANDAGE CONFORM 3  STR LF (GAUZE/BANDAGES/DRESSINGS) IMPLANT
BANDAGE ELASTIC 4 VELCRO ST LF (GAUZE/BANDAGES/DRESSINGS) IMPLANT
BANDAGE GAUZE 4  KLING STR (GAUZE/BANDAGES/DRESSINGS) ×2 IMPLANT
BENZOIN TINCTURE PRP APPL 2/3 (GAUZE/BANDAGES/DRESSINGS) ×2 IMPLANT
BLADE SURG ROTATE 9660 (MISCELLANEOUS) IMPLANT
BNDG COHESIVE 1X5 TAN STRL LF (GAUZE/BANDAGES/DRESSINGS) IMPLANT
BNDG ELASTIC 2 VLCR STRL LF (GAUZE/BANDAGES/DRESSINGS) ×2 IMPLANT
BNDG ESMARK 4X9 LF (GAUZE/BANDAGES/DRESSINGS) IMPLANT
CLOTH BEACON ORANGE TIMEOUT ST (SAFETY) ×2 IMPLANT
CORDS BIPOLAR (ELECTRODE) ×2 IMPLANT
COVER SURGICAL LIGHT HANDLE (MISCELLANEOUS) ×2 IMPLANT
CUFF TOURNIQUET SINGLE 18IN (TOURNIQUET CUFF) IMPLANT
CUFF TOURNIQUET SINGLE 24IN (TOURNIQUET CUFF) IMPLANT
CUFF TOURNIQUET SINGLE 34IN LL (TOURNIQUET CUFF) IMPLANT
CUFF TOURNIQUET SINGLE 44IN (TOURNIQUET CUFF) IMPLANT
DRAPE INCISE IOBAN 66X45 STRL (DRAPES) IMPLANT
DRAPE OEC MINIVIEW 54X84 (DRAPES) IMPLANT
DRAPE U-SHAPE 47X51 STRL (DRAPES) IMPLANT
DRSG EMULSION OIL 3X3 NADH (GAUZE/BANDAGES/DRESSINGS) IMPLANT
DURAPREP 26ML APPLICATOR (WOUND CARE) IMPLANT
ELECT REM PT RETURN 9FT ADLT (ELECTROSURGICAL) ×2
ELECTRODE REM PT RTRN 9FT ADLT (ELECTROSURGICAL) ×1 IMPLANT
GAUZE SPONGE 2X2 8PLY STRL LF (GAUZE/BANDAGES/DRESSINGS) IMPLANT
GAUZE XEROFORM 1X8 LF (GAUZE/BANDAGES/DRESSINGS) ×2 IMPLANT
GLOVE BIOGEL M 6.5 STRL (GLOVE) ×2 IMPLANT
GLOVE BIOGEL PI IND STRL 6.5 (GLOVE) ×1 IMPLANT
GLOVE BIOGEL PI IND STRL 8 (GLOVE) ×1 IMPLANT
GLOVE BIOGEL PI INDICATOR 6.5 (GLOVE) ×1
GLOVE BIOGEL PI INDICATOR 8 (GLOVE) ×1
GLOVE SURG ORTHO 8.0 STRL STRW (GLOVE) ×2 IMPLANT
GOWN PREVENTION PLUS LG XLONG (DISPOSABLE) IMPLANT
GOWN STRL NON-REIN LRG LVL3 (GOWN DISPOSABLE) ×4 IMPLANT
KIT BASIN OR (CUSTOM PROCEDURE TRAY) ×2 IMPLANT
KIT ROOM TURNOVER OR (KITS) ×2 IMPLANT
MANIFOLD NEPTUNE II (INSTRUMENTS) ×2 IMPLANT
NS IRRIG 1000ML POUR BTL (IV SOLUTION) ×2 IMPLANT
PACK ORTHO EXTREMITY (CUSTOM PROCEDURE TRAY) ×2 IMPLANT
PAD ARMBOARD 7.5X6 YLW CONV (MISCELLANEOUS) ×2 IMPLANT
SCRUB BETADINE 4OZ XXX (MISCELLANEOUS) ×2 IMPLANT
SOLUTION BETADINE 4OZ (MISCELLANEOUS) ×2 IMPLANT
SPECIMEN JAR SMALL (MISCELLANEOUS) ×2 IMPLANT
SPONGE GAUZE 2X2 STER 10/PKG (GAUZE/BANDAGES/DRESSINGS)
SPONGE GAUZE 4X4 12PLY (GAUZE/BANDAGES/DRESSINGS) ×2 IMPLANT
STRIP CLOSURE SKIN 1/2X4 (GAUZE/BANDAGES/DRESSINGS) ×2 IMPLANT
SUCTION FRAZIER TIP 10 FR DISP (SUCTIONS) ×2 IMPLANT
SUT ETHIBOND 4 0 TF (SUTURE) IMPLANT
SUT ETHIBOND 5 0 P 3 (SUTURE)
SUT ETHILON 3 0 PS 1 (SUTURE) ×2 IMPLANT
SUT ETHILON 4 0 P 3 18 (SUTURE) IMPLANT
SUT ETHILON 5 0 P 3 18 (SUTURE)
SUT NYLON ETHILON 5-0 P-3 1X18 (SUTURE) IMPLANT
SUT POLY ETHIBOND 5-0 P-3 1X18 (SUTURE) IMPLANT
SUT PROLENE 4 0 P 3 18 (SUTURE) IMPLANT
SUT SILK 4 0 PS 2 (SUTURE) IMPLANT
SUT VIC AB 3-0 FS2 27 (SUTURE) IMPLANT
TOWEL OR 17X24 6PK STRL BLUE (TOWEL DISPOSABLE) ×2 IMPLANT
TOWEL OR 17X26 10 PK STRL BLUE (TOWEL DISPOSABLE) ×2 IMPLANT
TUBE CONNECTING 12X1/4 (SUCTIONS) ×2 IMPLANT
WATER STERILE IRR 1000ML POUR (IV SOLUTION) ×2 IMPLANT

## 2011-11-04 NOTE — Anesthesia Preprocedure Evaluation (Addendum)
Anesthesia Evaluation  Patient identified by MRN, date of birth, ID band Patient awake    Reviewed: Allergy & Precautions, H&P , NPO status , Patient's Chart, lab work & pertinent test results  Airway Mallampati: II TM Distance: >3 FB   Mouth opening: Limited Mouth Opening  Dental  (+) Poor Dentition,    Pulmonary Current Smoker,    Pulmonary exam normal       Cardiovascular hypertension,     Neuro/Psych PSYCHIATRIC DISORDERS    GI/Hepatic   Endo/Other    Renal/GU      Musculoskeletal   Abdominal   Peds  Hematology  (+) HIV,   Anesthesia Other Findings   Reproductive/Obstetrics                         Anesthesia Physical Anesthesia Plan  ASA: III  Anesthesia Plan: General   Post-op Pain Management:    Induction: Intravenous  Airway Management Planned: LMA  Additional Equipment:   Intra-op Plan:   Post-operative Plan: Extubation in OR  Informed Consent: I have reviewed the patients History and Physical, chart, labs and discussed the procedure including the risks, benefits and alternatives for the proposed anesthesia with the patient or authorized representative who has indicated his/her understanding and acceptance.   Dental advisory given  Plan Discussed with: CRNA and Surgeon  Anesthesia Plan Comments:        Anesthesia Quick Evaluation

## 2011-11-04 NOTE — Clinical Documentation Improvement (Signed)
POA DOCUMENTATION CLARIFICATION QUERY  Please update your documentation within the medical record to reflect your response to this query.                                                                                     11/04/11  Dear Dr. Shela Commons. [facility]  In a better effort to capture your patient's severity of illness, reflect appropriate length of stay and utilization of resources, a review of the patient medical record has revealed the following indicators.    Based on your clinical judgment, please clarify and document in a progress note and/or discharge summary the clinical condition associated with the following supporting information:  In responding to this query please exercise your independent judgment.  The fact that a query is asked, does not imply that any particular answer is desired or expected.   Per H + P and progress note documentation of  "HIV" and "HIV +" being documented.  If this patient has AIDS please clarify terminology if appropriate :  HIV disease, AIDS, symptomatic HIV,  AIDS like syndrome/disease/illness.  Thank you.   Medicare rules require specification as to whether an inpatient diagnosis was present at the time of admission.  Please clarify if the following diagnosis,              was:  HIV disease, or  AIDS,  Or symptomatic HIV,  Or  AIDS like syndrome/ or disease/ or illness   _ Present at the time of admission  _ NOT present at the time of inpatient admission and it developed during the inpatient stay  _ Unable to clinically determine whether the condition was present on admission.  _  Documentation insufficient to determine if condition was present at the time of inpatient admission   You may use possible, probable, or suspect with inpatient documentation. possible, probable, suspected diagnoses MUST be documented at the time of discharge  Reviewed: per problem list/ and diagnosis list for current visit patient has "HIV Disease"  Thank  You,  Sincerely, Leonette Most Ajayla Iglesias  Clinical Documentation Specialist RN, BSN : Pager  [phone] [facility] Information [facility] [place]

## 2011-11-04 NOTE — Progress Notes (Addendum)
Subjective: No acute events overnight.  Patient still complains of 6/10 right 2nd toe pain, received 3mg  morphine total overnight.  Has been receiving 25mg  benedryl with morphine to help with itching.  Received 5mg  ambien to sleep overnight.   Objective: Vital signs in last 24 hours: Filed Vitals:   11/02/11 2049 11/03/11 0609 11/03/11 1300 11/03/11 2145  BP: 138/94 126/81 95/31 129/87  Pulse: 58 71 64 67  Temp: 97.7 F (36.5 C)  97.9 F (36.6 C) 97.5 F (36.4 C)  TempSrc: Oral Oral Oral   Resp: 20 20 18 16   Height:      Weight:      SpO2: 100% 99% 98% 100%   Weight change:   Intake/Output Summary (Last 24 hours) at 11/04/11 0701 Last data filed at 11/04/11 0500  Gross per 24 hour  Intake    590 ml  Output    400 ml  Net    190 ml   Physical Exam: General: laying in bed, NAD  HEENT: PERRL, EOMI, no scleral icterus, no conjunctival pallor  Cardiac: RRR, no rubs, murmurs or gallops  Pulm: clear to auscultation bilaterally, moving normal volumes of air  Abd: soft, nontender, nondistended, BS normoactive  Ext: warm and well perfused, no pedal edema, right 2nd toe erythematous extending to base of toe, mild edema of right 2nd toe, tender to palpation, especially at tip of toe, decreased ROM of right 2nd toe likely 2/2 pain, no change from admission  Neuro: alert and oriented X3, cranial nerves II-XII grossly intact, strength and sensation to light touch equal in bilateral upper and lower extremities   Lab Results: Basic Metabolic Panel:  Lab 11/02/11 0454  NA 142  K 4.5  CL 107  CO2 29  GLUCOSE 94  BUN 12  CREATININE 1.32  CALCIUM 9.4  MG --  PHOS --   CBC:  Lab 11/03/11 0557 11/02/11 1349  WBC 6.2 6.2  NEUTROABS -- 3.2  HGB 11.8* 12.9*  HCT 37.6* 40.7  MCV 74.5* 74.7*  PLT 225 255   Anemia Panel:  Lab 11/03/11 1019  VITAMINB12 358  FOLATE 10.9  FERRITIN 110  TIBC 275  IRON 44  RETICCTPCT 1.5   Micro Results: No results found for this or any  previous visit (from the past 240 hour(s)). Studies/Results: Mr Foot Right W Wo Contrast  11/03/2011  *RADIOLOGY REPORT*  Clinical Data: Redness and swelling of the right second toe. Question osteomyelitis.  MRI OF THE RIGHT FOREFOOT WITHOUT AND WITH CONTRAST  Technique:  Multiplanar, multisequence MR imaging was performed both before and after administration of intravenous contrast.  Contrast: 15mL MULTIHANCE GADOBENATE DIMEGLUMINE 529 MG/ML IV SOLN  Comparison: Plain films 11/02/2011.  Findings: There is edema with some subcutaneous enhancement about the distal phalanx of the second toe.  There is a small focus of edema and enhancement in the tuft of the second toe.  Bone marrow signal is otherwise unremarkable.  No abscess is identified.  There is no fracture.  Hallux valgus deformity is noted.  IMPRESSION:  1.  Cellulitis about the distal phalanx of the second toe with a very small focus of mild marrow edema and enhancement in the tuft compatible with osteomyelitis. 2.  Hallux valgus.  Original Report Authenticated By: Bernadene Bell. D'ALESSIO, M.D.   Dg Foot Complete Right  11/02/2011  *RADIOLOGY REPORT*  Clinical Data: Evaluate for osteomyelitis  RIGHT FOOT COMPLETE - 3+ VIEW  Comparison: None  Findings: There is no evidence of fracture or  dislocation. Hallux valgus deformity noted.  There is no evidence of arthropathy or other focal bone abnormality. Diffuse soft tissue swelling surrounds the second distal phalanx.  IMPRESSION:  1.  No evidence for osteomyelitis. 2.  Soft tissue swelling.  Original Report Authenticated By: Rosealee Albee, M.D.   Medications: I have reviewed the patient's current medications. Scheduled Meds:   . atazanavir  300 mg Oral Q breakfast  . emtricitabine-tenofovir  1 tablet Oral Daily  . gadobenate dimeglumine  15 mL Intravenous Once  . nicotine  21 mg Transdermal Daily  . piperacillin-tazobactam (ZOSYN)  IV  3.375 g Intravenous Q8H  . pneumococcal 23 valent vaccine   0.5 mL Intramuscular Tomorrow-1000  . ritonavir  100 mg Oral Daily  . sodium chloride  3 mL Intravenous Q12H  . vancomycin  750 mg Intravenous Q12H  . zolpidem  5 mg Oral Once  . DISCONTD: heparin  5,000 Units Subcutaneous Q8H   Continuous Infusions:   . sodium chloride     PRN Meds:.diphenhydrAMINE, morphine injection, sodium chloride, DISCONTD: HYDROcodone-acetaminophen  Assessment/Plan: #Right 2nd toe pain: 2/2 osteomyelitis suggested by MRI. Uric acid was wnl. Plan: Patient to have bony debridement of right 2nd toe today, patient has been NPO after midnight, pain mgmt with IV morphine (1mg  q 4h)  #Microcytic Anemia: Asymptomatic. Trending down- may be dilutional since pt getting IVF with IV abx. Patient seems to be around at baseline and is borderline anemic. Anemia panal was wnl. May be 2/2 HIV.  Plan: Monitor for symptoms  #HIV: Stable as of 09/22/11. Patient reports compliance with medications (CD4 570, VL <20, 09/22/11) Plan: Continue home meds- Norvir, Reyataz, Truvada   #VTE Prophylaxis: Heparin 5000u q8h, held today for surgery  #Tobacco Abuse: Will continue to discuss with patient  Plan: start Nicotine 21mg  patch daily, Smoking cessation consult    LOS: 2 days   [name] 11/04/2011, 7:01 AM  Internal Medicine Teaching Service Attending Note Date: 11/04/2011  Patient name: Gary [name]  Medical record number: [id number]  Date of birth: 1971-07-18    This patient has been seen and discussed with the house staff. Please see their note for complete details. I concur with their findings with the following additions/corrections: His pain and swelling have decreased. He is to go to OR today for partial amputation. Will see if we can get bone Cx. Continue current antibiotics  Johny Sax 11/04/2011, 11:33 AM

## 2011-11-04 NOTE — Preoperative (Signed)
Beta Blockers   Reason not to administer Beta Blockers:Not Applicable 

## 2011-11-05 ENCOUNTER — Other Ambulatory Visit: Payer: Self-pay | Admitting: Orthopedic Surgery

## 2011-11-05 LAB — VANCOMYCIN, TROUGH: Vancomycin Tr: 7 ug/mL — ABNORMAL LOW (ref 10.0–20.0)

## 2011-11-05 MED ORDER — FENTANYL CITRATE 0.05 MG/ML IJ SOLN
INTRAMUSCULAR | Status: DC | PRN
  Administered 2011-11-04 (×2): 50 ug via INTRAVENOUS

## 2011-11-05 MED ORDER — OXYCODONE HCL 5 MG PO TABS
10.0000 mg | ORAL_TABLET | ORAL | Status: DC | PRN
  Administered 2011-11-05 – 2011-11-09 (×27): 10 mg via ORAL
  Filled 2011-11-05 (×29): qty 2

## 2011-11-05 MED ORDER — SENNOSIDES-DOCUSATE SODIUM 8.6-50 MG PO TABS
1.0000 | ORAL_TABLET | Freq: Every evening | ORAL | Status: DC | PRN
  Filled 2011-11-05: qty 1

## 2011-11-05 MED ORDER — ONDANSETRON HCL 4 MG PO TABS: 4.0000 mg | ORAL_TABLET | Freq: Four times a day (QID) | ORAL | Status: DC | PRN

## 2011-11-05 MED ORDER — VANCOMYCIN HCL IN DEXTROSE 1-5 GM/200ML-% IV SOLN
1000.0000 mg | Freq: Three times a day (TID) | INTRAVENOUS | Status: DC
  Administered 2011-11-06 – 2011-11-09 (×11): 1000 mg via INTRAVENOUS
  Filled 2011-11-05 (×12): qty 200

## 2011-11-05 MED ORDER — EPHEDRINE SULFATE 50 MG/ML IJ SOLN
INTRAMUSCULAR | Status: DC | PRN
  Administered 2011-11-05: 5 mg via INTRAVENOUS

## 2011-11-05 MED ORDER — ONDANSETRON HCL 4 MG/2ML IJ SOLN: 4.0000 mg | Freq: Four times a day (QID) | INTRAMUSCULAR | Status: DC | PRN

## 2011-11-05 MED ORDER — BUPIVACAINE HCL (PF) 0.5 % IJ SOLN
INTRAMUSCULAR | Status: DC | PRN
  Administered 2011-11-05: 9 mL

## 2011-11-05 MED ORDER — HYDROMORPHONE HCL PF 1 MG/ML IJ SOLN
1.0000 mg | INTRAMUSCULAR | Status: DC | PRN
  Administered 2011-11-05 – 2011-11-07 (×14): 1 mg via INTRAVENOUS
  Filled 2011-11-05 (×14): qty 1

## 2011-11-05 MED ORDER — HYDROMORPHONE HCL PF 1 MG/ML IJ SOLN: 0.2500 mg | INTRAMUSCULAR | Status: DC | PRN

## 2011-11-05 MED ORDER — PROPOFOL 10 MG/ML IV EMUL
INTRAVENOUS | Status: DC | PRN
  Administered 2011-11-04: 200 mg via INTRAVENOUS

## 2011-11-05 MED ORDER — LACTATED RINGERS IV SOLN
INTRAVENOUS | Status: DC | PRN
  Administered 2011-11-04: via INTRAVENOUS

## 2011-11-05 MED ORDER — METOCLOPRAMIDE HCL 10 MG PO TABS
5.0000 mg | ORAL_TABLET | Freq: Three times a day (TID) | ORAL | Status: DC | PRN
  Filled 2011-11-05: qty 1

## 2011-11-05 MED ORDER — ONDANSETRON HCL 4 MG/2ML IJ SOLN
INTRAMUSCULAR | Status: DC | PRN
  Administered 2011-11-04: 4 mg via INTRAVENOUS

## 2011-11-05 MED ORDER — ZOLPIDEM TARTRATE 5 MG PO TABS
5.0000 mg | ORAL_TABLET | Freq: Once | ORAL | Status: AC
  Administered 2011-11-05: 5 mg via ORAL
  Filled 2011-11-05: qty 1

## 2011-11-05 MED ORDER — NICOTINE 21 MG/24HR TD PT24
21.0000 mg | MEDICATED_PATCH | Freq: Every day | TRANSDERMAL | Status: DC
  Administered 2011-11-09: 21 mg via TRANSDERMAL
  Filled 2011-11-05 (×6): qty 1

## 2011-11-05 MED ORDER — PROMETHAZINE HCL 25 MG/ML IJ SOLN: 6.2500 mg | INTRAMUSCULAR | Status: DC | PRN

## 2011-11-05 MED ORDER — MIDAZOLAM HCL 5 MG/5ML IJ SOLN
INTRAMUSCULAR | Status: DC | PRN
  Administered 2011-11-04: 2 mg via INTRAVENOUS

## 2011-11-05 MED ORDER — METOCLOPRAMIDE HCL 5 MG/ML IJ SOLN
5.0000 mg | Freq: Three times a day (TID) | INTRAMUSCULAR | Status: DC | PRN
  Filled 2011-11-05: qty 2

## 2011-11-05 NOTE — Op Note (Signed)
NAMEMICHIO, THIER NO.:  [id number]  MEDICAL RECORD NO.:  [id number]  LOCATION:  [facility]                         FACILITY:  [facility]  PHYSICIAN:  Burnard Bunting, M.D.    DATE OF BIRTH:  Aug 07, 1971  DATE OF PROCEDURE:  11/05/2011 DATE OF DISCHARGE:                              OPERATIVE REPORT   PREOPERATIVE DIAGNOSIS:  Right second toe osteomyelitis.  POSTOPERATIVE DIAGNOSIS:  Right second toe osteomyelitis.  PROCEDURE:  Right second toe distal tuft amputation through the distal portion of the middle phalanx.  SURGEON:  Burnard Bunting, MD  ASSISTANT:  None.  ANESTHESIA:  General endotracheal.  ESTIMATED BLOOD LOSS:  Minimal.  INDICATIONS:  Gary [name] is a patient with a right second toe infection following pin removal presents now for operative management after explanation of risks and benefits.  PROCEDURE IN DETAIL:  The patient was brought to the operating room where general endotracheal anesthesia was induced. Preoperative antibiotics administered.  Time-out was called.  Right foot was prepped with Betadine, draped in a sterile manner.  Ankle Esmarch was utilized for approximately 7 minutes.  Fishmouth incision was made, centered at the distal end of the middle phalanx.  Skin and subcutaneous tissues were sharply divided.  Distal phalanx was amputated through the joint. Bone tissue was sent for culture.  The rest was sent to path. Tourniquet was released.  Thorough irrigation was performed.  The flap was then closed dorsally with simple 3-0 nylon sutures.  Numbing medicine was placed into the toe area for digital block purposes.  Bulky dressing was applied.  The patient tolerated the procedure well without immediate complication.     Burnard Bunting, M.D.     GSD/MEDQ  D:  11/05/2011  T:  11/05/2011  Job:  [id number]

## 2011-11-05 NOTE — Progress Notes (Signed)
  Internal Medicine Teaching Service Attending Note Date: 11/05/2011  Patient name: Gary [name]  Medical record number: [id number]  Date of birth: May 12, 1971    This patient has been seen and discussed with the house staff. Please see their note for complete details. I concur with their findings with the following additions/corrections: S/p distal toe amputation, would plan to continue anbx for 2 weeks post-op. Could change zosyn to invanz at d/c.   Johny Sax 11/05/2011, 10:26 AM

## 2011-11-05 NOTE — Brief Op Note (Signed)
11/02/2011 - 11/05/2011  12:27 AM  PATIENT:  Gary [name]  40 y.o. male  PRE-OPERATIVE DIAGNOSIS:  infected right second toe  POST-OPERATIVE DIAGNOSIS:  infected right second toe partial amputation  PROCEDURE:  Procedure(s): AMPUTATION DIGIT r 2nd toe  SURGEON:  Surgeon(s): Cammy Copa  ASSISTANT:   ANESTHESIA:   local and general  EBL: 2 ml    Total I/O In: 300 [I.V.:300] Out: 5 [Blood:5]  BLOOD ADMINISTERED: none  DRAINS: none   LOCAL MEDICATIONS USED:  none  SPECIMEN:  No Specimen  COUNTS:  YES  TOURNIQUET:   Total Tourniquet Time Documented: Calf (Right) - 12 minutes  DICTATION: .Other Dictation: Dictation Number [id number]  PLAN OF CARE: Admit to inpatient   PATIENT DISPOSITION:  PACU - hemodynamically stable

## 2011-11-05 NOTE — Transfer of Care (Signed)
Immediate Anesthesia Transfer of Care Note  Patient: Gary [name]  Procedure(s) Performed:  AMPUTATION DIGIT - Right Second Toe Distal Phalanx Amputation  Patient Location: PACU  Anesthesia Type: General  Level of Consciousness: awake, alert  and oriented  Airway & Oxygen Therapy: Patient connected to nasal cannula oxygen  Post-op Assessment: Report given to PACU RN and Post -op Vital signs reviewed and stable  Post vital signs: Reviewed and stable  Complications: No apparent anesthesia complications

## 2011-11-05 NOTE — Anesthesia Postprocedure Evaluation (Signed)
  Anesthesia Post-op Note  Patient: Gary [name]  Procedure(s) Performed:  AMPUTATION DIGIT - Right Second Toe Distal Phalanx Amputation  Patient Location: PACU  Anesthesia Type: General  Level of Consciousness: awake, alert  and oriented  Airway and Oxygen Therapy: Patient Spontanous Breathing  Post-op Pain: none  Post-op Assessment: Post-op Vital signs reviewed and Patient's Cardiovascular Status Stable  Post-op Vital Signs: Reviewed and stable  Complications: No apparent anesthesia complications

## 2011-11-05 NOTE — Anesthesia Procedure Notes (Addendum)
Performed by: Julianne Rice Z   Procedure Name: LMA Insertion Date/Time: 11/05/2011 11:56 PM Performed by: Pricilla Holm Pre-anesthesia Checklist: Patient identified, Timeout performed, Emergency Drugs available, Suction available and Patient being monitored Patient Re-evaluated:Patient Re-evaluated prior to inductionOxygen Delivery Method: Circle System Utilized Preoxygenation: Pre-oxygenation with 100% oxygen Intubation Type: IV induction LMA: LMA inserted LMA Size: 4.0 Number of attempts: 1 Tube secured with: Tape

## 2011-11-05 NOTE — Progress Notes (Signed)
Noted plan to d/c pt with 2 weeks of Invanz,  Pt will need PICC to receive IV antibiotic per [facility]. Please order if appropriate.  CRoyal RN MPH [phone]

## 2011-11-05 NOTE — Progress Notes (Signed)
Subjective: Up all night, did not get back to room until 2am, significant pain, but has been moving around. Afebrile.   Objective: Vital signs in last 24 hours: Filed Vitals:   11/05/11 0045 11/05/11 0100 11/05/11 0200 11/05/11 0655  BP: 91/42 98/45 110/66 119/70  Pulse:   73 65  Temp: 98.2 F (36.8 C) 98 F (36.7 C) 98 F (36.7 C) 97.8 F (36.6 C)  TempSrc:      Resp:   16 16  Height:      Weight:      SpO2:   96% 100%   Weight change:   Intake/Output Summary (Last 24 hours) at 11/05/11 1348 Last data filed at 11/05/11 0900  Gross per 24 hour  Intake   1790 ml  Output      5 ml  Net   1785 ml   Physical Exam: General: able to partially bear weight (patient was getting up to go to restroom as I walked in), NAD HEENT: PERRL, EOMI, no scleral icterus, no conjunctival pallor  Cardiac: RRR, no rubs, murmurs or gallops  Pulm: clear to auscultation bilaterally, moving normal volumes of air  Abd: soft, nontender, nondistended, BS normoactive  Ext: warm and well perfused, no pedal edema, right foot and 2nd toe wrapped, dressing clean Neuro: alert and oriented X3, cranial nerves II-XII grossly intact, strength and sensation to light touch equal in bilateral upper and lower extremities   Lab Results: Basic Metabolic Panel:  Lab 11/02/11 1610  NA 142  K 4.5  CL 107  CO2 29  GLUCOSE 94  BUN 12  CREATININE 1.32  CALCIUM 9.4  MG --  PHOS --   CBC:  Lab 11/03/11 0557 11/02/11 1349  WBC 6.2 6.2  NEUTROABS -- 3.2  HGB 11.8* 12.9*  HCT 37.6* 40.7  MCV 74.5* 74.7*  PLT 225 255   Anemia Panel:  Lab 11/03/11 1019  VITAMINB12 358  FOLATE 10.9  FERRITIN 110  TIBC 275  IRON 44  RETICCTPCT 1.5   Micro Results: Recent Results (from the past 240 hour(s))  SURGICAL PCR SCREEN     Status: Abnormal   Collection Time   11/04/11  5:55 AM      Component Value Range Status Comment   MRSA, PCR NEGATIVE  NEGATIVE  Final    Staphylococcus aureus POSITIVE (*) NEGATIVE  Final    TISSUE CULTURE     Status: Normal (Preliminary result)   Collection Time   11/05/11 12:50 AM      Component Value Range Status Comment   Specimen Description TISSUE RIGHT FOOT   Final    Special Requests PT ON ZOSYN   Final    Gram Stain     Final    Value: NO WBC SEEN     NO ORGANISMS SEEN   Culture PENDING   Incomplete    Report Status PENDING   Incomplete   ANAEROBIC CULTURE     Status: Normal (Preliminary result)   Collection Time   11/05/11 12:51 AM      Component Value Range Status Comment   Specimen Description TISSUE RIGHT FOOT   Final    Special Requests PT ON ZOSYN   Final    Gram Stain     Final    Value: NO WBC SEEN     NO ORGANISMS SEEN   Culture PENDING   Incomplete    Report Status PENDING   Incomplete    Medications: I have reviewed the patient's current  medications. Scheduled Meds:    . atazanavir  300 mg Oral Q breakfast  . emtricitabine-tenofovir  1 tablet Oral Daily  . nicotine  21 mg Transdermal Daily  . piperacillin-tazobactam (ZOSYN)  IV  3.375 g Intravenous Q8H  . ritonavir  100 mg Oral Daily  . sodium chloride  3 mL Intravenous Q12H  . vancomycin  750 mg Intravenous Q12H  . DISCONTD: emtricitabine-tenofovir  1 tablet Oral Daily  . DISCONTD: emtricitabine-tenofovir  1 tablet Oral Daily  . DISCONTD: nicotine  21 mg Transdermal Daily  . DISCONTD: nicotine  21 mg Transdermal Daily  . DISCONTD: ritonavir  100 mg Oral Daily    PRN Meds:.diphenhydrAMINE, HYDROmorphone (DILAUDID) injection, metoCLOPramide (REGLAN) injection, metoCLOPramide, morphine injection, ondansetron (ZOFRAN) IV, ondansetron, oxyCODONE, senna-docusate, sodium chloride, DISCONTD: bupivacaine, DISCONTD: HYDROmorphone, DISCONTD: promethazine Assessment/Plan: #Right 2nd toe pain: 2/2 osteomyelitis suggested by MRI.  Plan:  -POD #1 after partial amputation of right 2nd toe, pain mgmt with dilaudid 1mg  q2h IV, morphine 1mg  q4h, oxyIR PO 10mg  q3h, with senokot for constipation, continue  IV Vanc & Zosyn today, will change zosyn to invanz -Tissue cultures of bone pending -have discussed case with CSW Bonnye Fava, as patient has been living in a homeless shelter, and will need different housing in order to have picc placed, will follow up -Pt to see Dr. August Saucer on POD 10  #Microcytic Anemia: Asymptomatic. Drop yesterday have have been dilutional since pt getting IVF with IV abx. Patient seems to be around at baseline and is borderline anemic. Anemia panal was wnl. May be 2/2 HIV.  Plan: Monitor for symptoms, am CBC  #HIV: Stable as of 09/22/11. Patient reports compliance with medications (CD4 570, VL <20, 09/22/11)  Plan: Continue home meds- Norvir, Reyataz, Truvada   #VTE Prophylaxis: No VTE proph, patient ambulatory.   #Tobacco Abuse: Will continue to discuss with patient  Plan: start Nicotine 21mg  patch daily, Smoking cessation consult   LOS: 3 days   [name], Gary [name] 11/05/2011, 1:48 PM

## 2011-11-05 NOTE — Progress Notes (Signed)
ANTIBIOTIC CONSULT NOTE - Follow-up  Pharmacy Consult for Vancomycin and Zosyn Indication: Osteomyelitis L 2nd toe  Allergies  Allergen Reactions  . Nsaids Itching  . Ultram (Tramadol Hcl) Rash    Patient Measurements: Height: 5\' 8"  (172.7 cm) Weight: 160 lb (72.576 kg) IBW/kg (Calculated) : 68.4    Vital Signs: Temp: 97.8 F (36.6 C) (11/16 0655) BP: 119/70 mmHg (11/16 0655) Pulse Rate: 65  (11/16 0655)  Labs:  Basename 11/03/11 0557  WBC 6.2  HGB 11.8*  PLT 225  LABCREA --  CREATININE --   Estimated Creatinine Clearance: 73.4 ml/min (by C-G formula based on Cr of 1.32).   Microbiology: Recent Results (from the past 720 hour(s))  SURGICAL PCR SCREEN     Status: Abnormal   Collection Time   11/04/11  5:55 AM      Component Value Range Status Comment   MRSA, PCR NEGATIVE  NEGATIVE  Final    Staphylococcus aureus POSITIVE (*) NEGATIVE  Final   TISSUE CULTURE     Status: Normal (Preliminary result)   Collection Time   11/05/11 12:50 AM      Component Value Range Status Comment   Specimen Description TISSUE RIGHT FOOT   Final    Special Requests PT ON ZOSYN   Final    Gram Stain     Final    Value: NO WBC SEEN     NO ORGANISMS SEEN   Culture PENDING   Incomplete    Report Status PENDING   Incomplete   ANAEROBIC CULTURE     Status: Normal (Preliminary result)   Collection Time   11/05/11 12:51 AM      Component Value Range Status Comment   Specimen Description TISSUE RIGHT FOOT   Final    Special Requests PT ON ZOSYN   Final    Gram Stain     Final    Value: NO WBC SEEN     NO ORGANISMS SEEN   Culture PENDING   Incomplete    Report Status PENDING   Incomplete     Medical History: Past Medical History  Diagnosis Date  . Immune deficiency disorder   . Bipolar disorder   . Hypertension   . Sickle cell trait   . HIV (human immunodeficiency virus infection)     Assessment: 40 YO HIV positive male on Vancomycin D#3 and Zosyn D#4 for osteomyelitis of  left 2nd toe, Pt. Est. crcl ~70s. No new cbc or bmet. Vancomycin trough is low for goal of 15-20.   Plan:  1. Increase Vancomycin 1g IV q8h with next dose due.  2. Continue Zosyn 3.375gm IV q8h.    Fayne Norrie 11/05/2011,4:20 PM

## 2011-11-05 NOTE — Progress Notes (Signed)
Subjective: My toe hurts   Objective: Vital signs in last 24 hours: Temp:  [97.8 F (36.6 C)-98.6 F (37 C)] 97.8 F (36.6 C) (11/16 0655) Pulse Rate:  [59-73] 65  (11/16 0655) Resp:  [16] 16  (11/16 0655) BP: (91-120)/(42-85) 119/70 mmHg (11/16 0655) SpO2:  [96 %-100 %] 100 % (11/16 0655)  Intake/Output from previous day: 11/15 0701 - 11/16 0700 In: 1550 [P.O.:240; I.V.:910; IV Piggyback:400] Out: 5 [Blood:5] Intake/Output this shift: Total I/O In: 240 [P.O.:240] Out: -   Exam:  Neurologically intact  Labs:  [facility] 11/03/11 0557 11/02/11 1349  HGB 11.8* 12.9*    Basename 11/03/11 0557 11/02/11 1349  WBC 6.2 6.2  RBC 5.05 5.45  HCT 37.6* 40.7  PLT 225 255    Basename 11/02/11 1349  NA 142  K 4.5  CL 107  CO2 29  BUN 12  CREATININE 1.32  GLUCOSE 94  CALCIUM 9.4   No results found for this basename: LABPT:2,INR:2 in the last 72 hours  Assessment/Plan: Keep dressing on - f/u with me in 10 days for recheck - wbat on foot   DEAN,GREGORY SCOTT 11/05/2011, 11:59 AM

## 2011-11-06 LAB — CBC
Hemoglobin: 11.6 g/dL — ABNORMAL LOW (ref 13.0–17.0)
MCHC: 30.1 g/dL (ref 30.0–36.0)
Platelets: 229 10*3/uL (ref 150–400)
RDW: 14.6 % (ref 11.5–15.5)

## 2011-11-06 LAB — BASIC METABOLIC PANEL
Calcium: 9.2 mg/dL (ref 8.4–10.5)
GFR calc Af Amer: 87 mL/min — ABNORMAL LOW (ref >90)
GFR calc non Af Amer: 75 mL/min — ABNORMAL LOW (ref >90)
Glucose, Bld: 97 mg/dL (ref 70–99)
Potassium: 4.3 mEq/L (ref 3.5–5.1)
Sodium: 133 mEq/L — ABNORMAL LOW (ref 135–145)

## 2011-11-06 MED ORDER — ZOLPIDEM TARTRATE 5 MG PO TABS
5.0000 mg | ORAL_TABLET | Freq: Every evening | ORAL | Status: DC | PRN
  Administered 2011-11-06 – 2011-11-08 (×3): 5 mg via ORAL
  Filled 2011-11-06 (×3): qty 1

## 2011-11-06 NOTE — Progress Notes (Signed)
Subjective: Patient has no complaints this morning.   Objective: Vital signs in last 24 hours: Temp:  [97.6 F (36.4 C)-98.7 F (37.1 C)] 97.8 F (36.6 C) (11/17 0543) Pulse Rate:  [59-78] 59  (11/17 0543) Resp:  [18] 18  (11/17 0543) BP: (115-154)/(70-82) 115/74 mmHg (11/17 0543) SpO2:  [94 %-99 %] 94 % (11/17 0543)  Intake/Output from previous day: 11/16 0701 - 11/17 0700 In: 1430 [P.O.:1070; I.V.:360] Out: -  Intake/Output this shift: Total I/O In: 175 [P.O.:175] Out: -    Basename 11/06/11 0630  HGB 11.6*    Basename 11/06/11 0630  WBC 4.9  RBC 5.15  HCT 38.6*  PLT 229    Basename 11/06/11 0630  NA 133*  K 4.3  CL 98  CO2 26  BUN 9  CREATININE 1.20  GLUCOSE 97  CALCIUM 9.2   No results found for this basename: LABPT:2,INR:2 in the last 72 hours  Neurologically intact  Assessment/Plan: Patient is scheduled for a PICC line placement. Anticipate discharge after PICC line placed   Derec Mozingo V 11/06/2011, 9:01 AM

## 2011-11-06 NOTE — Progress Notes (Signed)
Subjective: Reports significant 10/10 pain now, but had been walking around this morning to increase strength. Over the last 24h, received 6mg  Dilaudid, 1mg  morphine and 80mg  OxyIR.  Eating well. Last BM yesterday morning. No new complaints. No acute events overnight.  Afebrile.  Objective: Vital signs in last 24 hours: Filed Vitals:   11/05/11 1500 11/05/11 2030 11/05/11 2221 11/06/11 0543  BP: 135/80 137/82 154/70 115/74  Pulse: 73 77 78 59  Temp: 97.6 F (36.4 C) 97.6 F (36.4 C) 98.7 F (37.1 C) 97.8 F (36.6 C)  TempSrc:   Oral Oral  Resp: 18 18 18 18   Height:      Weight:      SpO2:  99% 97% 94%   Weight change:   Intake/Output Summary (Last 24 hours) at 11/06/11 4098 Last data filed at 11/06/11 0745  Gross per 24 hour  Intake   1605 ml  Output      0 ml  Net   1605 ml   Physical Exam: General: able to partially bear weight, NAD  HEENT: PERRL, EOMI, no scleral icterus, no conjunctival pallor  Cardiac: RRR, no rubs, murmurs or gallops  Pulm: clear to auscultation bilaterally, moving normal volumes of air  Abd: soft, nontender, nondistended, BS normoactive  Ext: warm and well perfused, no pedal edema, right foot and 2nd toe wrapped, dressing clean  Neuro: alert and oriented X3, cranial nerves II-XII grossly intact, strength and sensation to light touch equal in bilateral upper and lower extremities   Lab Results: Basic Metabolic Panel:  Lab 11/06/11 1191 11/02/11 1349  NA 133* 142  K 4.3 4.5  CL 98 107  CO2 26 29  GLUCOSE 97 94  BUN 9 12  CREATININE 1.20 1.32  CALCIUM 9.2 9.4  MG -- --  PHOS -- --   CBC:  Lab 11/06/11 0630 11/03/11 0557 11/02/11 1349  WBC 4.9 6.2 --  NEUTROABS -- -- 3.2  HGB 11.6* 11.8* --  HCT 38.6* 37.6* --  MCV 75.0* 74.5* --  PLT 229 225 --   Anemia Panel:  Lab 11/03/11 1019  VITAMINB12 358  FOLATE 10.9  FERRITIN 110  TIBC 275  IRON 44  RETICCTPCT 1.5   Micro Results: Recent Results (from the past 240 hour(s))    SURGICAL PCR SCREEN     Status: Abnormal   Collection Time   11/04/11  5:55 AM      Component Value Range Status Comment   MRSA, PCR NEGATIVE  NEGATIVE  Final    Staphylococcus aureus POSITIVE (*) NEGATIVE  Final   TISSUE CULTURE     Status: Normal (Preliminary result)   Collection Time   11/05/11 12:50 AM      Component Value Range Status Comment   Specimen Description TISSUE RIGHT FOOT   Final    Special Requests PT ON ZOSYN   Final    Gram Stain     Final    Value: NO WBC SEEN     NO ORGANISMS SEEN   Culture NO GROWTH 1 DAY   Final    Report Status PENDING   Incomplete   ANAEROBIC CULTURE     Status: Normal (Preliminary result)   Collection Time   11/05/11 12:51 AM      Component Value Range Status Comment   Specimen Description TISSUE RIGHT FOOT   Final    Special Requests PT ON ZOSYN   Final    Gram Stain     Final  Value: NO WBC SEEN     NO ORGANISMS SEEN   Culture PENDING   Incomplete    Report Status PENDING   Incomplete    Medications: I have reviewed the patient's current medications. Scheduled Meds:   . atazanavir  300 mg Oral Q breakfast  . emtricitabine-tenofovir  1 tablet Oral Daily  . nicotine  21 mg Transdermal Daily  . piperacillin-tazobactam (ZOSYN)  IV  3.375 g Intravenous Q8H  . ritonavir  100 mg Oral Daily  . sodium chloride  3 mL Intravenous Q12H  . vancomycin  1,000 mg Intravenous Q8H  . zolpidem  5 mg Oral Once  . DISCONTD: vancomycin  750 mg Intravenous Q12H   Continuous Infusions:  PRN Meds:.diphenhydrAMINE, HYDROmorphone (DILAUDID) injection, metoCLOPramide (REGLAN) injection, metoCLOPramide, morphine injection, ondansetron (ZOFRAN) IV, ondansetron, oxyCODONE, senna-docusate, sodium chloride, zolpidem   Assessment/Plan: #Right 2nd toe pain: 2/2 osteomyelitis suggested by MRI.  Plan:  -POD #2 after partial amputation of right 2nd toe, pain mgmt with dilaudid 1mg  q2h IV, morphine 1mg  q4h, oxyIR PO 10mg  q3h, with senokot for constipation,  continue IV Vanc & Zosyn today, will change zosyn to invanz at discharge -Tissue cultures of bone pending  -have discussed case with CSW Bonnye Fava, as patient has been living in a homeless shelter, and will need different housing in order to have picc placed, will follow up, pt likely here through the weekend -Pt to see Dr. August Saucer on POD 10   #Microcytic Anemia: Asymptomatic. Drop on 11/03/11 may have have been dilutional since pt getting IVF with IV abx. Pt's Hb has been stable.  Patient seems to be around at baseline and is borderline anemic. Anemia panal was wnl. May be 2/2 HIV.  Plan: Monitor for symptoms  #HIV: Stable as of 09/22/11. Patient reports compliance with medications (CD4 570, VL <20, 09/22/11)  Plan: Continue home meds- Norvir, Reyataz, Truvada   #VTE Prophylaxis: No VTE proph, patient ambulatory.   #Tobacco Abuse: Will continue to discuss with patient  Plan: continue Nicotine 21mg  patch daily   LOS: 4 days   KAPADIA, Luc Shammas 11/06/2011, 8:07 AM

## 2011-11-07 DIAGNOSIS — M869 Osteomyelitis, unspecified: Secondary | ICD-10-CM

## 2011-11-07 DIAGNOSIS — L0390 Cellulitis, unspecified: Secondary | ICD-10-CM

## 2011-11-07 DIAGNOSIS — B20 Human immunodeficiency virus [HIV] disease: Secondary | ICD-10-CM

## 2011-11-07 DIAGNOSIS — L0291 Cutaneous abscess, unspecified: Secondary | ICD-10-CM

## 2011-11-07 MED ORDER — HYDROMORPHONE HCL PF 1 MG/ML IJ SOLN
1.0000 mg | Freq: Three times a day (TID) | INTRAMUSCULAR | Status: DC | PRN
  Administered 2011-11-08: 1 mg via INTRAVENOUS
  Filled 2011-11-07 (×2): qty 1

## 2011-11-07 MED ORDER — HYDROMORPHONE HCL PF 1 MG/ML IJ SOLN: 1.0000 mg | INTRAMUSCULAR | Status: AC

## 2011-11-07 NOTE — Progress Notes (Signed)
Subjective: No acute events overnight. Reports 8/10 throbbing pain, but has been walking quite a bit.  Notes his wife was here visiting, and he is on talking terms with her, but still plans to go to homeless shelter when leaving hospital.  Eating well.  Normal BM yesterday. Pt agreeable to decreasing IV dilaudid frequency. 7mg  dilaudid in last 24h, 80mg  oxy IR in last 24h.   Objective: Vital signs in last 24 hours: Filed Vitals:   11/06/11 0543 11/06/11 1400 11/06/11 2100 11/07/11 0540  BP: 115/74 140/80 125/72 123/78  Pulse: 59 78 72 80  Temp: 97.8 F (36.6 C) 98.6 F (37 C) 98.3 F (36.8 C) 98.8 F (37.1 C)  TempSrc: Oral Oral Oral Oral  Resp: 18 20 20 18   Height:      Weight:      SpO2: 94% 98% 99% 93%   Weight change:   Intake/Output Summary (Last 24 hours) at 11/07/11 1302 Last data filed at 11/07/11 0900  Gross per 24 hour  Intake    150 ml  Output      0 ml  Net    150 ml   Physical Exam: General: able to partially bear weight, NAD  HEENT: PERRL, EOMI, no scleral icterus, no conjunctival pallor  Cardiac: RRR, no rubs, murmurs or gallops  Pulm: clear to auscultation bilaterally, moving normal volumes of air  Abd: soft, nontender, nondistended, BS normoactive  Ext: warm and well perfused, no pedal edema, right foot and 2nd toe wrapped, dressing clean  Neuro: alert and oriented X3, cranial nerves II-XII grossly intact, strength and sensation to light touch equal in bilateral upper and lower extremities   Lab Results: Basic Metabolic Panel:  Lab 11/06/11 0981 11/02/11 1349  NA 133* 142  K 4.3 4.5  CL 98 107  CO2 26 29  GLUCOSE 97 94  BUN 9 12  CREATININE 1.20 1.32  CALCIUM 9.2 9.4  MG -- --  PHOS -- --   CBC:  Lab 11/06/11 0630 11/03/11 0557 11/02/11 1349  WBC 4.9 6.2 --  NEUTROABS -- -- 3.2  HGB 11.6* 11.8* --  HCT 38.6* 37.6* --  MCV 75.0* 74.5* --  PLT 229 225 --   Anemia Panel:  Lab 11/03/11 1019  VITAMINB12 358  FOLATE 10.9  FERRITIN 110    TIBC 275  IRON 44  RETICCTPCT 1.5   Micro Results: Recent Results (from the past 240 hour(s))  SURGICAL PCR SCREEN     Status: Abnormal   Collection Time   11/04/11  5:55 AM      Component Value Range Status Comment   MRSA, PCR NEGATIVE  NEGATIVE  Final    Staphylococcus aureus POSITIVE (*) NEGATIVE  Final   TISSUE CULTURE     Status: Normal (Preliminary result)   Collection Time   11/05/11 12:50 AM      Component Value Range Status Comment   Specimen Description TISSUE RIGHT FOOT   Final    Special Requests PT ON ZOSYN   Final    Gram Stain     Final    Value: NO WBC SEEN     NO ORGANISMS SEEN   Culture NO GROWTH 2 DAYS   Final    Report Status PENDING   Incomplete   ANAEROBIC CULTURE     Status: Normal (Preliminary result)   Collection Time   11/05/11 12:51 AM      Component Value Range Status Comment   Specimen Description TISSUE RIGHT FOOT  Final    Special Requests PT ON ZOSYN   Final    Gram Stain     Final    Value: NO WBC SEEN     NO ORGANISMS SEEN   Culture     Final    Value: NO ANAEROBES ISOLATED; CULTURE IN PROGRESS FOR 5 DAYS   Report Status PENDING   Incomplete    Medications: I have reviewed the patient's current medications. Scheduled Meds:   . atazanavir  300 mg Oral Q breakfast  . emtricitabine-tenofovir  1 tablet Oral Daily  . nicotine  21 mg Transdermal Daily  . piperacillin-tazobactam (ZOSYN)  IV  3.375 g Intravenous Q8H  . ritonavir  100 mg Oral Daily  . sodium chloride  3 mL Intravenous Q12H  . vancomycin  1,000 mg Intravenous Q8H   Continuous Infusions:  PRN Meds:.diphenhydrAMINE, HYDROmorphone (DILAUDID) injection, metoCLOPramide (REGLAN) injection, metoCLOPramide, ondansetron (ZOFRAN) IV, ondansetron, oxyCODONE, senna-docusate, sodium chloride, zolpidem, DISCONTD:  HYDROmorphone (DILAUDID) injection Assessment/Plan: #Right 2nd toe pain: 2/2 osteomyelitis suggested by MRI. POD #3 after partial amputation of right 2nd toe Plan:  -change  pain mgmt with dilaudid 1mg  q8h IV, continue oxyIR PO 10mg  q3h, with senokot for constipation, continue IV Vanc & Zosyn today, will change zosyn to invanz at discharge  -will need to determine pain mgmt plan for discharge, pain described as throbbing and so he likely doesn't get significant relief from opioids -Tissue cultures of bone pending - NGTD -have discussed case with CSW Bonnye Fava, as patient has been living in a homeless shelter, and will need different housing in order to have picc placed, will follow up, pt likely here through the weekend  -Pt to see Dr. August Saucer on POD 10   #Microcytic Anemia: Asymptomatic. Drop on 11/03/11 may have have been dilutional since pt getting IVF with IV abx. Pt's Hb has been stable. Patient seems to be around at baseline and is borderline anemic. Anemia panal was wnl. May be 2/2 HIV.  Plan: Monitor for symptoms   #HIV: Stable as of 09/22/11. Patient reports compliance with medications (CD4 570, VL <20, 09/22/11)  Plan: Continue home meds- Norvir, Reyataz, Truvada   #VTE Prophylaxis: SCDs  #Tobacco Abuse: Will continue to discuss with patient  Plan: continue Nicotine 21mg  patch daily   LOS: 5 days   KAPADIA, Angelys Yetman 11/07/2011, 1:02 PM

## 2011-11-08 LAB — CBC
Hemoglobin: 13 g/dL (ref 13.0–17.0)
MCH: 23.8 pg — ABNORMAL LOW (ref 26.0–34.0)
MCV: 74.5 fL — ABNORMAL LOW (ref 78.0–100.0)
RBC: 5.46 MIL/uL (ref 4.22–5.81)

## 2011-11-08 LAB — BASIC METABOLIC PANEL
CO2: 26 mEq/L (ref 19–32)
Glucose, Bld: 95 mg/dL (ref 70–99)
Potassium: 4.2 mEq/L (ref 3.5–5.1)
Sodium: 134 mEq/L — ABNORMAL LOW (ref 135–145)

## 2011-11-08 LAB — TISSUE CULTURE

## 2011-11-08 NOTE — Progress Notes (Signed)
ANTIBIOTIC CONSULT NOTE - FOLLOW UP  Pharmacy Consult for Vancomycin/Zosyn Indication: Osteomyelitis of R 2nd toe s/p partial amputation  Allergies  Allergen Reactions  . Nsaids Itching  . Ultram (Tramadol Hcl) Rash    Patient Measurements: Height: 5\' 8"  (172.7 cm) Weight: 161 lb 9.6 oz (73.3 kg) IBW/kg (Calculated) : 68.4    Vital Signs: Temp: 97.8 F (36.6 C) (11/19 0447) Temp src: Oral (11/19 0447) BP: 118/74 mmHg (11/19 0447) Pulse Rate: 65  (11/19 0447) Intake/Output from previous day: 11/18 0701 - 11/19 0700 In: 600 [P.O.:350; IV Piggyback:250] Out: 0  Intake/Output from this shift:    Labs:  [facility] 11/08/11 0525 11/06/11 0630  WBC 4.9 4.9  HGB 13.0 11.6*  PLT 224 229  LABCREA -- --  CREATININE 1.08 1.20   Estimated Creatinine Clearance: 89.7 ml/min (by C-G formula based on Cr of 1.08).  Basename 11/05/11 1516  VANCOTROUGH 7.0*  VANCOPEAK --  Drue Dun --  GENTTROUGH --  GENTPEAK --  GENTRANDOM --  TOBRATROUGH --  TOBRAPEAK --  TOBRARND --  AMIKACINPEAK --  AMIKACINTROU --  AMIKACIN --     Microbiology: Recent Results (from the past 720 hour(s))  SURGICAL PCR SCREEN     Status: Abnormal   Collection Time   11/04/11  5:55 AM      Component Value Range Status Comment   MRSA, PCR NEGATIVE  NEGATIVE  Final    Staphylococcus aureus POSITIVE (*) NEGATIVE  Final   TISSUE CULTURE     Status: Normal   Collection Time   11/05/11 12:50 AM      Component Value Range Status Comment   Specimen Description TISSUE RIGHT FOOT   Final    Special Requests PT ON ZOSYN   Final    Gram Stain     Final    Value: NO WBC SEEN     NO ORGANISMS SEEN   Culture NO GROWTH 3 DAYS   Final    Report Status 11/08/2011 FINAL   Final   ANAEROBIC CULTURE     Status: Normal (Preliminary result)   Collection Time   11/05/11 12:51 AM      Component Value Range Status Comment   Specimen Description TISSUE RIGHT FOOT   Final    Special Requests PT ON ZOSYN   Final    Gram Stain     Final    Value: NO WBC SEEN     NO ORGANISMS SEEN   Culture     Final    Value: NO ANAEROBES ISOLATED; CULTURE IN PROGRESS FOR 5 DAYS   Report Status PENDING   Incomplete     Anti-infectives     Start     Dose/Rate Route Frequency Ordered Stop   11/06/11 0100   vancomycin (VANCOCIN) IVPB 1000 mg/200 mL premix        1,000 mg 200 mL/hr over 60 Minutes Intravenous Every 8 hours 11/05/11 1638     11/04/11 2100   emtricitabine-tenofovir (TRUVADA) 200-300 MG per tablet 1 tablet        1 tablet Oral Daily 11/04/11 1918     11/04/11 2100   ritonavir (NORVIR) tablet 100 mg        100 mg Oral Daily 11/04/11 1922     11/04/11 2000   emtricitabine-tenofovir (TRUVADA) 200-300 MG per tablet 1 tablet  Status:  Discontinued        1 tablet Oral Daily 11/04/11 1916 11/04/11 1918   11/03/11 1800  piperacillin-tazobactam (ZOSYN) IVPB  3.375 g       3.375 g 12.5 mL/hr over 240 Minutes Intravenous 3 times per day 11/03/11 1722     11/03/11 0800   atazanavir (REYATAZ) capsule 300 mg        300 mg Oral Daily with breakfast 11/02/11 2044     11/03/11 0300   vancomycin (VANCOCIN) 750 mg in sodium chloride 0.9 % 150 mL IVPB  Status:  Discontinued        750 mg 150 mL/hr over 60 Minutes Intravenous Every 12 hours 11/02/11 2044 11/05/11 1638   11/02/11 2200   doxycycline (VIBRA-TABS) tablet 100 mg  Status:  Discontinued        100 mg Oral Every 12 hours 11/02/11 1821 11/02/11 1822   11/02/11 2130   ritonavir (NORVIR) tablet 100 mg  Status:  Discontinued        100 mg Oral Daily 11/02/11 2044 11/04/11 1922   11/02/11 2130   emtricitabine-tenofovir (TRUVADA) 200-300 MG per tablet 1 tablet  Status:  Discontinued        1 tablet Oral Daily 11/02/11 2044 11/04/11 1916   11/02/11 1500   vancomycin (VANCOCIN) IVPB 1000 mg/200 mL premix        1,000 mg 200 mL/hr over 60 Minutes Intravenous  Once 11/02/11 1451 11/02/11 1603   11/02/11 1445   vancomycin (VANCOCIN) injection 1,000 mg  Status:   Discontinued        1,000 mg Intravenous  Once 11/02/11 1438 11/02/11 1448   11/02/11 1400  piperacillin-tazobactam (ZOSYN) IVPB 3.375 g       3.375 g 12.5 mL/hr over 240 Minutes Intravenous  Once 11/02/11 1347 11/02/11 1825   11/02/11 1345   vancomycin (VANCOCIN) injection 15 mg/kg  Status:  Discontinued        15 mg/kg Intravenous  Once 11/02/11 1347 11/02/11 1435          Assessment: 40 y.o. M on Vanc/Zosyn total abx D#7 empirically for R 2nd toe osteomyelitis now s/p partial amputation. Vanc adjusted on 11/16 based on low trough level. Renal function improving and appears stable. Will hold off on checking another Vancomycin trough at this time until further abx plans at discharge are made. Noted plans for ~2 weeks of treatment post-op.   Goal of Therapy:  Vancomycin trough of 15-20  Plan:  1. Continue Vancomycin 1g IV q8h 2. Continue Zosyn 3.375g IV q8h 3. Will continue to follow culture results, LOT, and abx de-escalation plans  Rolley Sims 11/08/2011,10:23 AM

## 2011-11-08 NOTE — Progress Notes (Addendum)
Subjective: Still has 6/10 throbbing pain.  Trying to decrease amt of dilaudid use.  Had normal BM yesterday. Over 24h used 4mg  dilaudid and 70 mg oxy IR. Afebrile. Reports loosening dressing on foot yesterday.  Objective: Vital signs in last 24 hours: Filed Vitals:   11/07/11 0540 11/07/11 1900 11/07/11 2051 11/08/11 0447  BP: 123/78 119/84 123/75 118/74  Pulse: 80 76 71 65  Temp: 98.8 F (37.1 C) 98 F (36.7 C) 97.9 F (36.6 C) 97.8 F (36.6 C)  TempSrc: Oral Oral Oral Oral  Resp: 18 18 20 20   Height:      Weight:   161 lb 9.6 oz (73.3 kg)   SpO2: 93% 96% 97% 96%   Weight change:   Intake/Output Summary (Last 24 hours) at 11/08/11 0851 Last data filed at 11/08/11 0449  Gross per 24 hour  Intake    600 ml  Output      0 ml  Net    600 ml   Physical Exam: General: able to partially bear weight, NAD  HEENT: PERRL, EOMI, no scleral icterus, no conjunctival pallor  Cardiac: RRR, no rubs, murmurs or gallops  Pulm: clear to auscultation bilaterally, moving normal volumes of air  Abd: soft, nontender, nondistended, BS normoactive  Ext: warm and well perfused, no pedal edema, right foot and 2nd toe wrapped, dressing clean  Neuro: alert and oriented X3  Lab Results: Basic Metabolic Panel:  Lab 11/08/11 4696 11/06/11 0630  NA 134* 133*  K 4.2 4.3  CL 99 98  CO2 26 26  GLUCOSE 95 97  BUN 11 9  CREATININE 1.08 1.20  CALCIUM 9.1 9.2  MG -- --  PHOS -- --   CBC:  Lab 11/08/11 0525 11/06/11 0630 11/02/11 1349  WBC 4.9 4.9 --  NEUTROABS -- -- 3.2  HGB 13.0 11.6* --  HCT 40.7 38.6* --  MCV 74.5* 75.0* --  PLT 224 229 --   Anemia Panel:  Lab 11/03/11 1019  VITAMINB12 358  FOLATE 10.9  FERRITIN 110  TIBC 275  IRON 44  RETICCTPCT 1.5   Micro Results: Recent Results (from the past 240 hour(s))  SURGICAL PCR SCREEN     Status: Abnormal   Collection Time   11/04/11  5:55 AM      Component Value Range Status Comment   MRSA, PCR NEGATIVE  NEGATIVE  Final    Staphylococcus aureus POSITIVE (*) NEGATIVE  Final   TISSUE CULTURE     Status: Normal   Collection Time   11/05/11 12:50 AM      Component Value Range Status Comment   Specimen Description TISSUE RIGHT FOOT   Final    Special Requests PT ON ZOSYN   Final    Gram Stain     Final    Value: NO WBC SEEN     NO ORGANISMS SEEN   Culture NO GROWTH 3 DAYS   Final    Report Status 11/08/2011 FINAL   Final   ANAEROBIC CULTURE     Status: Normal (Preliminary result)   Collection Time   11/05/11 12:51 AM      Component Value Range Status Comment   Specimen Description TISSUE RIGHT FOOT   Final    Special Requests PT ON ZOSYN   Final    Gram Stain     Final    Value: NO WBC SEEN     NO ORGANISMS SEEN   Culture     Final    Value:  NO ANAEROBES ISOLATED; CULTURE IN PROGRESS FOR 5 DAYS   Report Status PENDING   Incomplete    Medications: I have reviewed the patient's current medications. Scheduled Meds:   . atazanavir  300 mg Oral Q breakfast  . emtricitabine-tenofovir  1 tablet Oral Daily  .  HYDROmorphone (DILAUDID) injection  1 mg Intravenous NOW  . nicotine  21 mg Transdermal Daily  . piperacillin-tazobactam (ZOSYN)  IV  3.375 g Intravenous Q8H  . ritonavir  100 mg Oral Daily  . sodium chloride  3 mL Intravenous Q12H  . vancomycin  1,000 mg Intravenous Q8H   Continuous Infusions:  PRN Meds:.diphenhydrAMINE, HYDROmorphone (DILAUDID) injection, metoCLOPramide (REGLAN) injection, metoCLOPramide, ondansetron (ZOFRAN) IV, ondansetron, oxyCODONE, senna-docusate, sodium chloride, zolpidem, DISCONTD:  HYDROmorphone (DILAUDID) injection  Assessment/Plan: #Right 2nd toe pain: 2/2 osteomyelitis suggested by MRI. POD #4 after partial amputation of right 2nd toe  Plan:  -discontinue dilaudid today, continue oxyIR PO 10mg  q3h, with senokot for constipation, continue IV Vanc & Zosyn today, will change zosyn to invanz at discharge  -will need to determine pain mgmt plan for discharge, pain described  as throbbing and so he likely doesn't get significant relief from opioids  -Tissue cultures of bone pending - NGTD  -have discussed case with CSW Gary [name], as patient has been living in a homeless shelter, and will need different housing in order to have picc placed, will follow up today -Pt to see Gary [name] on POD 10   #Microcytic Anemia: Asymptomatic. Drop on 11/03/11 may have have been dilutional since pt getting IVF with IV abx. Pt's Hb has been stable/improving. Patient seems to be around at baseline and is borderline anemic. Anemia panal was wnl. May be 2/2 HIV.  Plan: Monitor for symptoms   #HIV: Stable as of 09/22/11. Patient reports compliance with medications (CD4 570, VL <20, 09/22/11)  Plan: Continue home meds- Norvir, Reyataz, Truvada   #VTE Prophylaxis: SCDs / pt ambulatory  #Tobacco Abuse: Will continue to discuss with patient  Plan: continue Nicotine 21mg  patch daily   LOS: 6 days   Gary [name] 11/08/2011, 8:51 AM  Internal Medicine Teaching Service Attending Note Date: 11/08/2011  Patient name: Gary [name]  Medical record number: [id number]  Date of birth: 12/09/1971    This patient has been seen and discussed with the house staff. Please see their note for complete details. I concur with their findings with the following additions/corrections: Wound partially undressed, very tender still. Will d/i CSW for placement, will help determine his anbx regimen.  Gary [name] 11/08/2011, 9:36 AM

## 2011-11-08 NOTE — Progress Notes (Signed)
Clinical Social Work-Please see shadow chart for full assessment. CSW completing letter for court (pt had court date today). Pt is looking into family support options forstay during course of antibiotics; CSW also initiating ST SNF search as back up to staying with family-CSW should have dispo options by end of day. Jodean Lima, [phone]

## 2011-11-09 ENCOUNTER — Encounter (HOSPITAL_COMMUNITY): Payer: Self-pay | Admitting: Orthopedic Surgery

## 2011-11-09 MED ORDER — ZOLPIDEM TARTRATE 5 MG PO TABS: 5.0000 mg | ORAL_TABLET | Freq: Every evening | ORAL | Status: DC | PRN

## 2011-11-09 MED ORDER — VANCOMYCIN HCL IN DEXTROSE 1-5 GM/200ML-% IV SOLN: 1000.0000 mg | Freq: Three times a day (TID) | INTRAVENOUS | Status: AC

## 2011-11-09 MED ORDER — SODIUM CHLORIDE 0.9 % IJ SOLN: 10.0000 mL | INTRAMUSCULAR | Status: DC | PRN

## 2011-11-09 MED ORDER — SENNOSIDES-DOCUSATE SODIUM 8.6-50 MG PO TABS: 1.0000 | ORAL_TABLET | Freq: Every evening | ORAL | Status: DC | PRN

## 2011-11-09 MED ORDER — NICOTINE 21 MG/24HR TD PT24: 1.0000 | MEDICATED_PATCH | TRANSDERMAL | Status: DC

## 2011-11-09 MED ORDER — OXYCODONE-ACETAMINOPHEN 10-325 MG PO TABS: 1.0000 | ORAL_TABLET | Freq: Four times a day (QID) | ORAL | Status: AC | PRN

## 2011-11-09 MED ORDER — OXYCODONE HCL 5 MG PO TABS: 10.0000 mg | ORAL_TABLET | Freq: Four times a day (QID) | ORAL | Status: DC | PRN

## 2011-11-09 MED ORDER — SODIUM CHLORIDE 0.9 % IV SOLN: 1.0000 g | INTRAVENOUS | Status: AC

## 2011-11-09 NOTE — Progress Notes (Signed)
Subjective: No acute events overnight. 50mg  oxyIR/24h. Still notes throbbing pain. No fevers/chills. Normal BMs.  Objective: Vital signs in last 24 hours: Filed Vitals:   11/08/11 1700 11/08/11 2120 11/09/11 0623 11/09/11 1000  BP: 115/74 121/79 108/60 119/60  Pulse: 80 73 71 77  Temp: 97.6 F (36.4 C) 97.4 F (36.3 C) 98.1 F (36.7 C) 98.6 F (37 C)  TempSrc: Oral Oral Oral Oral  Resp: 20 19 19 18   Height:      Weight:  161 lb 6 oz (73.2 kg)    SpO2: 97% 98% 96% 98%   Weight change: -3.5 oz (-0.1 kg)  Intake/Output Summary (Last 24 hours) at 11/09/11 1359 Last data filed at 11/09/11 0900  Gross per 24 hour  Intake   1200 ml  Output      1 ml  Net   1199 ml   Physical Exam: General: able to partially bear weight, NAD  HEENT: PERRL, EOMI, no scleral icterus, no conjunctival pallor  Cardiac: RRR, no rubs, murmurs or gallops  Pulm: clear to auscultation bilaterally, moving normal volumes of air  Abd: soft, nontender, nondistended, BS normoactive  Ext: warm and well perfused, no pedal edema, right foot and 2nd toe wrapped, dressing clean, some erythema of wound, but no purulent/bloody discharge, very tender to touch Neuro: alert and oriented X3  Lab Results: Basic Metabolic Panel:  Lab 11/08/11 1610 11/06/11 0630  NA 134* 133*  K 4.2 4.3  CL 99 98  CO2 26 26  GLUCOSE 95 97  BUN 11 9  CREATININE 1.08 1.20  CALCIUM 9.1 9.2  MG -- --  PHOS -- --   CBC:  Lab 11/08/11 0525 11/06/11 0630  WBC 4.9 4.9  NEUTROABS -- --  HGB 13.0 11.6*  HCT 40.7 38.6*  MCV 74.5* 75.0*  PLT 224 229   Anemia Panel:  Lab 11/03/11 1019  VITAMINB12 358  FOLATE 10.9  FERRITIN 110  TIBC 275  IRON 44  RETICCTPCT 1.5   Micro Results: Recent Results (from the past 240 hour(s))  SURGICAL PCR SCREEN     Status: Abnormal   Collection Time   11/04/11  5:55 AM      Component Value Range Status Comment   MRSA, PCR NEGATIVE  NEGATIVE  Final    Staphylococcus aureus POSITIVE (*)  NEGATIVE  Final   TISSUE CULTURE     Status: Normal   Collection Time   11/05/11 12:50 AM      Component Value Range Status Comment   Specimen Description TISSUE RIGHT FOOT   Final    Special Requests PT ON ZOSYN   Final    Gram Stain     Final    Value: NO WBC SEEN     NO ORGANISMS SEEN   Culture NO GROWTH 3 DAYS   Final    Report Status 11/08/2011 FINAL   Final   ANAEROBIC CULTURE     Status: Normal (Preliminary result)   Collection Time   11/05/11 12:51 AM      Component Value Range Status Comment   Specimen Description TISSUE RIGHT FOOT   Final    Special Requests PT ON ZOSYN   Final    Gram Stain     Final    Value: NO WBC SEEN     NO ORGANISMS SEEN   Culture     Final    Value: NO ANAEROBES ISOLATED; CULTURE IN PROGRESS FOR 5 DAYS   Report Status PENDING  Incomplete    Medications: I have reviewed the patient's current medications. Scheduled Meds:   . atazanavir  300 mg Oral Q breakfast  . emtricitabine-tenofovir  1 tablet Oral Daily  .  HYDROmorphone (DILAUDID) injection  1 mg Intravenous NOW  . nicotine  21 mg Transdermal Daily  . piperacillin-tazobactam (ZOSYN)  IV  3.375 g Intravenous Q8H  . ritonavir  100 mg Oral Daily  . sodium chloride  3 mL Intravenous Q12H  . vancomycin  1,000 mg Intravenous Q8H   Continuous Infusions:  PRN Meds:.diphenhydrAMINE, metoCLOPramide (REGLAN) injection, metoCLOPramide, ondansetron (ZOFRAN) IV, ondansetron, oxyCODONE, senna-docusate, sodium chloride, zolpidem  Assessment/Plan: #Right 2nd toe pain: 2/2 osteomyelitis suggested by MRI. POD #4 (op date 11/05/11) after partial amputation of right 2nd toe  Plan:  -change oxyIR PO 10mg  q6h, with senokot for constipation, continue IV Vanc & Zosyn today, will change zosyn to invanz at discharge  -Will discharge with Percocet 10-325 q6hPRN pain #12 -Tissue cultures of bone pending - NGTD  -have discussed case with CSW Bonnye Fava, pt has placement but waiting PICC placement  -Pt to  see Dr. August Saucer on POD 10   #Microcytic Anemia: Asymptomatic. Drop on 11/03/11 may have have been dilutional since pt getting IVF with IV abx. Pt's Hb has been stable/improving. Patient seems to be around at baseline and is borderline anemic. Anemia panal was wnl. May be 2/2 HIV.  Plan: Monitor for symptoms   #HIV: Stable as of 09/22/11. Patient reports compliance with medications (CD4 570, VL <20, 09/22/11)  Plan: Continue home meds- Norvir, Reyataz, Truvada   #VTE Prophylaxis: SCDs / pt ambulatory   #Tobacco Abuse: Will continue to discuss with patient  Plan: continue Nicotine 21mg  patch daily    LOS: 7 days   KAPADIA, Kemar Pandit 11/09/2011, 1:59 PM

## 2011-11-09 NOTE — Progress Notes (Signed)
Clinical Social Work-CSW facilitated pt d/c to [facility] with chart copy and [facility] transport. CSW confirmed pt will be able to return to shelter after course of antibiotics completed. CSW notified attending of need for co-sign on d/c summary. No further needs-sign off-Kirstyn Lean-MSW, [phone]

## 2011-11-09 NOTE — Progress Notes (Signed)
Orthopedic Tech Progress Note Patient Details:  Gary [name] [birth year] [id number]  Other Ortho Devices Type of Ortho Device: Postop boot Ortho Device Location: applied to right foot   [name] 11/09/2011, 1:48 PM

## 2011-11-09 NOTE — Discharge Summary (Addendum)
Internal Medicine Teaching [facility] Discharge Note  Name: Gary [name] MRN: [id number] DOB: 11/28/1971 40 y.o.  Date of Admission: 11/02/2011 12:51 PM Date of Discharge: 11/09/2011 Attending Physician: Johny Sax, MD  Discharge Diagnosis: #Osteomyelitis of 2nd toe of Right foot s/p partial amputation #HIV (CD4 570, VL<20, 09/22/11)  Discharge Medications: Current Discharge Medication List    START taking these medications   Details  nicotine (NICODERM CQ - DOSED IN MG/24 HOURS) 21 mg/24hr patch Place 1 patch (21 patches total) onto the skin daily. Qty: 14 patch, Refills: 0    oxyCODONE-acetaminophen (PERCOCET) 10-325 MG per tablet Take 1 tablet by mouth every 6 (six) hours as needed for pain. Qty: 20 tablet, Refills: 0    senna-docusate (SENOKOT-S) 8.6-50 MG per tablet Take 1 tablet by mouth at bedtime as needed for constipation. Qty: 14 tablet, Refills: 0    sodium chloride 0.9 % SOLN 50 mL with ertapenem 1 G SOLR 1 g Inject 1 g into the vein daily. Qty: 7 g, Refills: 0    vancomycin (VANCOCIN) 1 GM/200ML SOLN Inject 200 mLs (1,000 mg total) into the vein every 8 (eight) hours. Qty: 4000 mL, Refills: 0    zolpidem (AMBIEN) 5 MG tablet Take 1 tablet (5 mg total) by mouth at bedtime as needed for sleep. Qty: 14 tablet, Refills: 0      CONTINUE these medications which have NOT CHANGED   Details  atazanavir (REYATAZ) 300 MG capsule Take 1 capsule (300 mg total) by mouth daily with breakfast. Qty: 30 capsule, Refills: 0   Associated Diagnoses: Human immunodeficiency virus (HIV) disease    emtricitabine-tenofovir (TRUVADA) 200-300 MG per tablet Take 1 tablet by mouth daily. Qty: 30 tablet, Refills: 0   Associated Diagnoses: Human immunodeficiency virus (HIV) disease    ritonavir (NORVIR) 100 MG TABS Take 1 tablet (100 mg total) by mouth daily. Qty: 30 tablet, Refills: 11   Associated Diagnoses: Human immunodeficiency virus (HIV) disease        Disposition  and follow-up:   Gary [name] was discharged from [facility] in Stable condition.  He will follow up with Dr. August Saucer 11/17/11 at 10:30am to evaluate wound healing.   Follow-up Appointments: Follow-up Information    Follow up with [facility] on 11/17/2011. (10:30am)    Contact information:   [facility] [address] [place] [address] [phone]         Discharge Orders    Future Orders Please Complete By Expires   Diet general      Discharge instructions      Comments:   Please follow up with Dr. August Saucer at 10:20am on 11/17/11.  Please schedule a follow up appointment with you ID physician in the next 2 weeks as well.   Activity as tolerated - No restrictions      Leave dressing on - Keep it clean, dry, and intact until clinic visit      Call MD for:  temperature >100.4      Call MD for:  persistant nausea and vomiting      Call MD for:  severe uncontrolled pain      Call MD for:  redness, tenderness, or signs of infection (pain, swelling, redness, odor or green/yellow discharge around incision site)         Consultations: Treatment Team:  Cammy Copa  Procedures Performed:  11/03/2011 Clinical Data: Redness and swelling of the right second toe. Question osteomyelitis.  MRI OF THE  RIGHT FOREFOOT WITHOUT AND WITH CONTRAST  Comparison: Plain films 11/02/2011.   Findings: There is edema with some subcutaneous enhancement about the distal phalanx of the second toe.  There is a small focus of edema and enhancement in the tuft of the second toe.  Bone marrow signal is otherwise unremarkable.  No abscess is identified.  There is no fracture.  Hallux valgus deformity is noted.  IMPRESSION:  1.  Cellulitis about the distal phalanx of the second toe with a very small focus of mild marrow edema and enhancement in the tuft compatible with osteomyelitis. 2.  Hallux valgus.    11/02/2011  Clinical Data: Evaluate  for osteomyelitis  RIGHT FOOT COMPLETE - 3+ VIEW   Findings: There is no evidence of fracture or dislocation. Hallux valgus deformity noted.  There is no evidence of arthropathy or other focal bone abnormality. Diffuse soft tissue swelling surrounds the second distal phalanx.  IMPRESSION:  1.  No evidence for osteomyelitis. 2.  Soft tissue swelling.    Admission HPI:  Patient is a 40 yo man with PMH of HIV (CD4 570, VL <20, 09/22/11) that presents with left 2nd toe sharp/shooting 10+/10 pain starting at the base of the toe for the last 2 weeks. He reports pain improved to 6/10 after dilaudid in ED. Pain is worsened with movement, weight bearing and with touch. He reports that about 1 week PTA, he noticed puss and blood from his right 2nd toe. He notes that 3.5-4 weeks PTA he had his 2nd toe nail removed because it was black, though he had initially gone to the doctor for a right foot bunyon removal. He reports that he was instructed to clean 2nd toe with peroxide and treat pain with tylenol. He came to the ED about 2 weeks PTA where he was given Keflex and pain medication. He reports finishing abx about 1 week ago. He denies fever/chills.  Upon calling pt's pharmacy, it seems he filled his Keflex on 10/26/11, for a 10 day course, so it is not possible for him to have completed his course.  Admission Physical Exam:  Blood pressure 131/88, pulse 60, temperature 98.4 F (36.9 C), temperature source Oral, resp. rate 20, height 5\' 8"  (1.727 m), weight 160 lb (72.576 kg), SpO2 99.00%.  General: resting in bed, NAD  HEENT: PERRL, EOMI, no scleral icterus, no conjunctival pallor  Cardiac: RRR, no rubs, murmurs or gallops  Pulm: clear to auscultation bilaterally, moving normal volumes of air  Abd: soft, nontender, nondistended, BS present  Ext: warm and well perfused, no pedal edema, right 2nd toe erythematous extending to base of toe, mild edema of right 2nd toe, tender to palpation, especially at tip of toe,  decreased ROM of right 2nd toe likely 2/2 pain  Neuro: alert and oriented X3, cranial nerves II-XII grossly intact, strength and sensation to light touch equal in bilateral upper and lower extremities  Admission Lab results:  Basic Metabolic Panel:   [facility]  11/02/11 1349   NA  142   K  4.5   CL  107   CO2  29   GLUCOSE  94   BUN  12   CREATININE  1.32   CALCIUM  9.4   MG  --   PHOS  --    CBC:   Basename  11/02/11 1349   WBC  6.2   NEUTROABS  3.2   HGB  12.9*   HCT  40.7   MCV  74.7*   PLT  255    Hospital Course by problem list: #Right 2nd toe Osteomyelitis: Osteomyelitis seen on MRI, partial amputation done done on 11/05/11 by Dr. August Saucer.  Pain managed with IV dilaudid, weaned to Oxy IR, and being discharged with percocet. Was treated with IV Zosyn & Vanc throughout hospitalization.  PICC placed on 11/09/11, pt discharged with 1 week of IV invanz and vanc.  Tissue cultures pending. NGTD. Pt to be d/c'd to SNF as he is homeless and needs IV abx.   #Microcytic Anemia: Asymptomatic. Drop on 11/03/11 may have have been dilutional since pt getting IVF with IV abx. Pt's Hb has been stable/improving. Patient seems to be around at baseline and is borderline anemic. Anemia panal was wnl. May be 2/2 HIV.   #HIV: Stable as of 09/22/11. Patient reports compliance with medications (CD4 570, VL <20, 09/22/11)  Continue home meds- Norvir, Reyataz, Truvada   #Tobacco Abuse: continue Nicotine 21mg  patch daily  Discharge Vitals:  BP 114/67  Pulse 78  Temp(Src) 98.8 F (37.1 C) (Oral)  Resp 18  Ht 5\' 8"  (1.727 m)  Wt 161 lb 6 oz (73.2 kg)  BMI 24.54 kg/m2  SpO2 98%  Discharge Labs: No results found for this or any previous visit (from the past 24 hour(s)).  SignedVernice Jefferson 11/09/2011, 3:01 PM  CSW inadvertently signed d/c note during process of printing chart in order to d/c pt. CSW contacted [facility] help center who facilitated requesting additional signature here and suggested  current documentation. CSW will notify attending of need for signature.-Blessed Cotham-MSW, [phone]

## 2011-11-09 NOTE — Progress Notes (Signed)
Patient ID: [name], male   DOB: Oct 09, 1971, 40 y.o.   MRN: [id number] Internal Medicine Teaching Service Attending Note Date: 11/09/2011  Patient name: Gary [name]  Medical record number: [id number]  Date of birth: 11-17-1971    This patient has been seen and discussed with the house staff. Please see their note for complete details. I concur with their findings with the following additions/corrections: Pt feels better, decreased pain. Took down dressing and his toe appears to be healing well, there is erythema that is mild and not unexpected. There is no increase in heat. Plan to complete 2 weeks of anbx post-op. He states he is going to skilled facility.  Johny Sax 11/09/2011, 9:50 AM

## 2011-11-10 LAB — ANAEROBIC CULTURE

## 2011-12-01 ENCOUNTER — Telehealth: Payer: Self-pay | Admitting: *Deleted

## 2011-12-01 ENCOUNTER — Encounter: Payer: Self-pay | Admitting: *Deleted

## 2011-12-01 ENCOUNTER — Ambulatory Visit: Payer: Medicare Other | Admitting: Infectious Diseases

## 2011-12-01 NOTE — Telephone Encounter (Signed)
I tried both phone numbers. Both are disconnected. Will mail a letter to address shown

## 2011-12-02 ENCOUNTER — Encounter (HOSPITAL_COMMUNITY): Payer: Self-pay | Admitting: Nurse Practitioner

## 2011-12-02 ENCOUNTER — Emergency Department (HOSPITAL_COMMUNITY)
Admission: EM | Admit: 2011-12-02 | Discharge: 2011-12-02 | Disposition: A | Discharge status: Home / Self Care | Payer: Medicare Other | Attending: Emergency Medicine | Admitting: Emergency Medicine

## 2011-12-02 DIAGNOSIS — F319 Bipolar disorder, unspecified: Secondary | ICD-10-CM | POA: Insufficient documentation

## 2011-12-02 DIAGNOSIS — S98139A Complete traumatic amputation of one unspecified lesser toe, initial encounter: Secondary | ICD-10-CM | POA: Insufficient documentation

## 2011-12-02 DIAGNOSIS — Z21 Asymptomatic human immunodeficiency virus [HIV] infection status: Secondary | ICD-10-CM | POA: Insufficient documentation

## 2011-12-02 DIAGNOSIS — Z79899 Other long term (current) drug therapy: Secondary | ICD-10-CM | POA: Insufficient documentation

## 2011-12-02 DIAGNOSIS — I10 Essential (primary) hypertension: Secondary | ICD-10-CM | POA: Insufficient documentation

## 2011-12-02 DIAGNOSIS — M79674 Pain in right toe(s): Secondary | ICD-10-CM

## 2011-12-02 DIAGNOSIS — M79609 Pain in unspecified limb: Secondary | ICD-10-CM | POA: Insufficient documentation

## 2011-12-02 MED ORDER — HYDROCODONE-ACETAMINOPHEN 5-325 MG PO TABS
2.0000 | ORAL_TABLET | Freq: Once | ORAL | Status: AC
  Administered 2011-12-02: 2 via ORAL

## 2011-12-02 MED ORDER — HYDROCODONE-ACETAMINOPHEN 5-325 MG PO TABS
ORAL_TABLET | ORAL | Status: AC
  Administered 2011-12-02: 2 via ORAL
  Filled 2011-12-02: qty 2

## 2011-12-02 MED ORDER — HYDROCODONE-ACETAMINOPHEN 5-325 MG PO TABS: 2.0000 | ORAL_TABLET | Freq: Four times a day (QID) | ORAL | Status: AC | PRN

## 2011-12-02 NOTE — ED Notes (Signed)
Pt had infection 1 month ago in R 2nd toe, had to have partial amputation. Reports severe pain in this toe today and can not get appt with surgeon.

## 2011-12-02 NOTE — ED Notes (Signed)
Pt asking for pain medicine. Pt informed that he is waiting for a provider to order medicine. Pt given heating pack for comfort measures

## 2011-12-02 NOTE — ED Notes (Signed)
MD at bedside. 

## 2011-12-02 NOTE — ED Provider Notes (Signed)
History     CSN: [id number] Arrival date & time: 12/02/2011 11:55 AM   First MD Initiated Contact with Patient 12/02/11 1427      Chief Complaint  Patient presents with  . Toe Pain    (Consider location/radiation/quality/duration/timing/severity/associated sxs/prior treatment) HPI Complains of pain at right second toe since he had partial amputation 11/04/2011. Pain is worse with pressure no fever pain is unchanged. He presents today as he ran out of Vicodin. No other associated symptoms no fever no other complaint pain is moderate to severe . Not improved by anything Past Medical History  Diagnosis Date  . Immune deficiency disorder   . Bipolar disorder   . Hypertension   . HIV (human immunodeficiency virus infection)     Past Surgical History  Procedure Date  . Cervical fusion   . Amputation 11/04/2011    Procedure: AMPUTATION DIGIT;  Surgeon: Cammy Copa;  Location: [facility] OR;  Service: Orthopedics;  Laterality: Right;  Right Second Toe Distal Phalanx Amputation    History reviewed. No pertinent family history.  History  Substance Use Topics  . Smoking status: Current Everyday Smoker -- 0.5 packs/day for 15 years  . Smokeless tobacco: Never Used  . Alcohol Use: No     rarely      Review of Systems  Constitutional: Negative.   HENT: Negative.   Respiratory: Negative.   Cardiovascular: Negative.   Gastrointestinal: Negative.   Musculoskeletal: Negative.        Pain in right second toe, otherwise neg  Skin: Negative.   Neurological: Negative.   Hematological: Negative.   Psychiatric/Behavioral: Negative.     Allergies  Nsaids and Ultram  Home Medications   Current Outpatient Rx  Name Route Sig Dispense Refill  . ATAZANAVIR SULFATE 300 MG PO CAPS Oral Take 1 capsule (300 mg total) by mouth daily with breakfast. 30 capsule 0  . EMTRICITABINE-TENOFOVIR 200-300 MG PO TABS Oral Take 1 tablet by mouth daily. 30 tablet 0  . RITONAVIR 100 MG PO TABS Oral  Take 1 tablet (100 mg total) by mouth daily. 30 tablet 11    BP 119/75  Pulse 81  Temp(Src) 98.3 F (36.8 C) (Oral)  Resp 16  Ht 5\' 8"  (1.727 m)  Wt 160 lb (72.576 kg)  BMI 24.33 kg/m2  SpO2 98%  Physical Exam  Nursing note and vitals reviewed. Constitutional: He appears well-developed and well-nourished. No distress.  HENT:  Head: Normocephalic and atraumatic.  Right Ear: External ear normal.  Left Ear: External ear normal.  Eyes: EOM are normal.  Neck: Neck supple.  Cardiovascular: Normal rate.   Pulmonary/Chest: Effort normal.  Abdominal: He exhibits no distension.  Musculoskeletal:       Right foot :amputation of distal phalanx of second toe with well-healingsurgical wound. Distal aspect of second toe tender, hyperpigmented not warm or red.    ED Course  Procedures (including critical care time)  Labs Reviewed - No data to display No results found.   No diagnosis found.    MDM  No signs of infection Plan prescription hydrocodone-A. Pap Patient has followup appointment with Dr.Dean scheduled for 12/07/2011 Diagnosis pain in second toe right foot        Doug Sou, MD 12/02/11 1445

## 2011-12-27 ENCOUNTER — Emergency Department (HOSPITAL_COMMUNITY)
Admission: EM | Admit: 2011-12-27 | Discharge: 2011-12-27 | Disposition: A | Discharge status: Home / Self Care | Payer: Medicare Other | Attending: Emergency Medicine | Admitting: Emergency Medicine

## 2011-12-27 ENCOUNTER — Encounter (HOSPITAL_COMMUNITY): Payer: Self-pay | Admitting: Emergency Medicine

## 2011-12-27 DIAGNOSIS — M79609 Pain in unspecified limb: Secondary | ICD-10-CM | POA: Insufficient documentation

## 2011-12-27 DIAGNOSIS — Z981 Arthrodesis status: Secondary | ICD-10-CM | POA: Insufficient documentation

## 2011-12-27 DIAGNOSIS — M2550 Pain in unspecified joint: Secondary | ICD-10-CM | POA: Insufficient documentation

## 2011-12-27 DIAGNOSIS — S98139A Complete traumatic amputation of one unspecified lesser toe, initial encounter: Secondary | ICD-10-CM | POA: Insufficient documentation

## 2011-12-27 DIAGNOSIS — Z21 Asymptomatic human immunodeficiency virus [HIV] infection status: Secondary | ICD-10-CM | POA: Insufficient documentation

## 2011-12-27 DIAGNOSIS — M7989 Other specified soft tissue disorders: Secondary | ICD-10-CM | POA: Insufficient documentation

## 2011-12-27 DIAGNOSIS — I10 Essential (primary) hypertension: Secondary | ICD-10-CM | POA: Insufficient documentation

## 2011-12-27 DIAGNOSIS — M79676 Pain in unspecified toe(s): Secondary | ICD-10-CM

## 2011-12-27 DIAGNOSIS — F319 Bipolar disorder, unspecified: Secondary | ICD-10-CM | POA: Insufficient documentation

## 2011-12-27 MED ORDER — HYDROCODONE-ACETAMINOPHEN 5-325 MG PO TABS
2.0000 | ORAL_TABLET | Freq: Once | ORAL | Status: AC
  Administered 2011-12-27: 2 via ORAL
  Filled 2011-12-27: qty 2

## 2011-12-27 MED ORDER — HYDROCODONE-ACETAMINOPHEN 5-325 MG PO TABS: ORAL_TABLET | ORAL | Status: DC

## 2011-12-27 NOTE — ED Provider Notes (Signed)
History     CSN: [id number]  Arrival date & time 12/27/11  1121   First MD Initiated Contact with Patient 12/27/11 1248      Chief Complaint  Patient presents with  . Toe Pain    (Consider location/radiation/quality/duration/timing/severity/associated sxs/prior treatment) HPI Comments: Pt had distal tip of right second toe amputated in October by Dr. August Saucer.  He continues to have pain there.  Incision has closed, no drainage, no redness, no fevers.  Pt seen in the ED for same 3 weeks ago.  He reports he called and spoke to Dr. Lajoyce Corners who recommended some exercises and to follow up in 4 weeks if not better.  Pt is wearing tight fitting shoes, reports some improvement with resting, worse with standing and bearing weight and walking.    The history is provided by the patient.    Past Medical History  Diagnosis Date  . Immune deficiency disorder   . Bipolar disorder   . Hypertension   . HIV (human immunodeficiency virus infection)     Past Surgical History  Procedure Date  . Cervical fusion   . Amputation 11/04/2011    Procedure: AMPUTATION DIGIT;  Surgeon: Cammy Copa;  Location: [facility] OR;  Service: Orthopedics;  Laterality: Right;  Right Second Toe Distal Phalanx Amputation    History reviewed. No pertinent family history.  History  Substance Use Topics  . Smoking status: Current Everyday Smoker -- 0.5 packs/day for 15 years  . Smokeless tobacco: Never Used  . Alcohol Use: No     rarely      Review of Systems  Constitutional: Negative for fever.  Musculoskeletal: Positive for arthralgias.  Skin: Negative for rash.  Neurological: Negative for weakness and numbness.    Allergies  Nsaids and Ultram  Home Medications   Current Outpatient Rx  Name Route Sig Dispense Refill  . ATAZANAVIR SULFATE 300 MG PO CAPS Oral Take 1 capsule (300 mg total) by mouth daily with breakfast. 30 capsule 0  . EMTRICITABINE-TENOFOVIR 200-300 MG PO TABS Oral Take 1 tablet by mouth  daily. 30 tablet 0  . RITONAVIR 100 MG PO TABS Oral Take 1 tablet (100 mg total) by mouth daily. 30 tablet 11  . HYDROCODONE-ACETAMINOPHEN 5-325 MG PO TABS  1-2 tablets po q 4-6 hours prn moderate to severe pain 20 tablet 0    BP 120/67  Pulse 107  Temp 99.2 F (37.3 C)  Resp 18  SpO2 98%  Physical Exam  Nursing note and vitals reviewed. Constitutional: He appears well-developed and well-nourished.  Musculoskeletal: He exhibits tenderness. He exhibits no edema.       Feet:    ED Course  Procedures (including critical care time)  Labs Reviewed - No data to display No results found.   1. Pain in toe       MDM  I reviewed prior notes.  Pt is told to follow up with August Saucer.  Will get ortho tech to place post op shoe, cushion toe as well as possible.  Script for vicodin given.          Gavin Pound. Rache Klimaszewski, MD 12/27/11 1336

## 2011-12-27 NOTE — Discharge Instructions (Signed)
 You need to keep pressure off of your toe to ensure proper healing,  Do not use tight fitting shoes and socks.  Follow up with Dr. Addie or your own family doctor next week.  Continuing to take narcotic pain medication will not fix your toe.     Narcotic and benzodiazepine use may cause drowsiness, slowed breathing or dependence.  Please use with caution and do not drive, operate machinery or watch young children alone while taking them.  Taking combinations of these medications or drinking alcohol will potentiate these effects.

## 2011-12-27 NOTE — ED Notes (Signed)
S/p right second toe amputation end of Oct. C/o continued pain, difficulty ambulating. Last saw surgeon before Christmas. Well healed from surgery

## 2011-12-27 NOTE — ED Notes (Signed)
Pt c/o right second toe pain after having partial amputation; pt sts painful when walking

## 2011-12-28 ENCOUNTER — Other Ambulatory Visit: Payer: Self-pay

## 2011-12-28 DIAGNOSIS — B20 Human immunodeficiency virus [HIV] disease: Secondary | ICD-10-CM

## 2011-12-28 MED ORDER — EMTRICITABINE-TENOFOVIR DF 200-300 MG PO TABS: 1.0000 | ORAL_TABLET | Freq: Every day | ORAL | Status: DC

## 2011-12-28 MED ORDER — ATAZANAVIR SULFATE 300 MG PO CAPS: 300.0000 mg | ORAL_CAPSULE | Freq: Every day | ORAL | Status: DC

## 2011-12-28 MED ORDER — RITONAVIR 100 MG PO TABS: 100.0000 mg | ORAL_TABLET | Freq: Every day | ORAL | Status: DC

## 2012-01-10 ENCOUNTER — Encounter (HOSPITAL_COMMUNITY): Payer: Self-pay | Admitting: *Deleted

## 2012-01-10 ENCOUNTER — Emergency Department (HOSPITAL_COMMUNITY)
Admission: EM | Admit: 2012-01-10 | Discharge: 2012-01-10 | Disposition: A | Discharge status: Home / Self Care | Payer: Medicare Other | Attending: Emergency Medicine | Admitting: Emergency Medicine

## 2012-01-10 ENCOUNTER — Emergency Department (HOSPITAL_COMMUNITY): Payer: Medicare Other

## 2012-01-10 DIAGNOSIS — F17200 Nicotine dependence, unspecified, uncomplicated: Secondary | ICD-10-CM | POA: Insufficient documentation

## 2012-01-10 DIAGNOSIS — M7989 Other specified soft tissue disorders: Secondary | ICD-10-CM | POA: Insufficient documentation

## 2012-01-10 DIAGNOSIS — I10 Essential (primary) hypertension: Secondary | ICD-10-CM | POA: Insufficient documentation

## 2012-01-10 DIAGNOSIS — Z21 Asymptomatic human immunodeficiency virus [HIV] infection status: Secondary | ICD-10-CM | POA: Insufficient documentation

## 2012-01-10 DIAGNOSIS — M79674 Pain in right toe(s): Secondary | ICD-10-CM

## 2012-01-10 DIAGNOSIS — M79609 Pain in unspecified limb: Secondary | ICD-10-CM | POA: Insufficient documentation

## 2012-01-10 DIAGNOSIS — F319 Bipolar disorder, unspecified: Secondary | ICD-10-CM | POA: Insufficient documentation

## 2012-01-10 DIAGNOSIS — S98139A Complete traumatic amputation of one unspecified lesser toe, initial encounter: Secondary | ICD-10-CM | POA: Insufficient documentation

## 2012-01-10 MED ORDER — OXYCODONE-ACETAMINOPHEN 5-325 MG PO TABS
1.0000 | ORAL_TABLET | Freq: Once | ORAL | Status: AC
  Administered 2012-01-10: 1 via ORAL
  Filled 2012-01-10: qty 1

## 2012-01-10 MED ORDER — HYDROCODONE-ACETAMINOPHEN 5-500 MG PO TABS
1.0000 | ORAL_TABLET | Freq: Four times a day (QID) | ORAL | Status: AC | PRN
Start: 2012-01-10 — End: 2012-01-20

## 2012-01-10 NOTE — ED Notes (Signed)
Reports having partial amputation to right second toe back in oct, still having pain. No redness or swelling noted to toe.

## 2012-01-10 NOTE — ED Provider Notes (Signed)
History     CSN: [id number]  Arrival date & time 01/10/12  0909   First MD Initiated Contact with Patient 01/10/12 [phone]      Chief Complaint  Patient presents with  . Toe Pain    (Consider location/radiation/quality/duration/timing/severity/associated sxs/prior treatment) Patient is a 41 y.o. male presenting with toe pain. The history is provided by the patient.  Toe Pain This is a chronic problem. The current episode started more than 1 month ago. The problem occurs constantly. The problem has been gradually worsening. Associated symptoms include arthralgias and joint swelling. Pertinent negatives include no chills, fever, numbness or weakness.  Toe Pain This is a chronic problem. The current episode started more than 1 month ago. The problem occurs constantly. The problem has been gradually worsening.  Pt reports infection in the toe 3 months ago, states was diagnosed with osteomyelitis. States had to have distal part of the right 2nd toe amputated. Since then increased pain in that toe, now skin color changing, mild swelling, very tender to touch. Pt has appointment with Dr. August Saucer, his surgeon in 4 days. Denies fever, chills, malaise. Swelling does not spread outside of the second toe.  Past Medical History  Diagnosis Date  . Immune deficiency disorder   . Bipolar disorder   . Hypertension   . HIV (human immunodeficiency virus infection)     Past Surgical History  Procedure Date  . Cervical fusion   . Amputation 11/04/2011    Procedure: AMPUTATION DIGIT;  Surgeon: Cammy Copa;  Location: [facility] OR;  Service: Orthopedics;  Laterality: Right;  Right Second Toe Distal Phalanx Amputation    History reviewed. No pertinent family history.  History  Substance Use Topics  . Smoking status: Current Everyday Smoker -- 0.5 packs/day for 15 years  . Smokeless tobacco: Never Used  . Alcohol Use: No     rarely      Review of Systems  Constitutional: Negative for fever and  chills.  HENT: Negative.   Respiratory: Negative.   Cardiovascular: Negative.   Gastrointestinal: Negative.   Genitourinary: Negative.   Musculoskeletal: Positive for joint swelling and arthralgias.  Neurological: Negative for weakness and numbness.  Psychiatric/Behavioral: Negative.     Allergies  Nsaids and Ultram  Home Medications   Current Outpatient Rx  Name Route Sig Dispense Refill  . ATAZANAVIR SULFATE 300 MG PO CAPS Oral Take 1 capsule (300 mg total) by mouth daily with breakfast. 30 capsule 0  . EMTRICITABINE-TENOFOVIR 200-300 MG PO TABS Oral Take 1 tablet by mouth daily. 30 tablet 0  . HYDROCODONE-ACETAMINOPHEN 5-325 MG PO TABS  1-2 tablets po q 4-6 hours prn moderate to severe pain 20 tablet 0  . RITONAVIR 100 MG PO TABS Oral Take 1 tablet (100 mg total) by mouth daily. 30 tablet 11    BP 124/85  Pulse 84  Temp(Src) 98.5 F (36.9 C) (Oral)  Resp 18  SpO2 100%  Physical Exam  Nursing note and vitals reviewed. Constitutional: He is oriented to person, place, and time. He appears well-developed and well-nourished. No distress.  Neck: Neck supple.  Cardiovascular: Normal rate, regular rhythm and normal heart sounds.   Pulmonary/Chest: Effort normal and breath sounds normal. No respiratory distress.  Musculoskeletal: He exhibits tenderness.       Very minimal swelling to the right 2nd toe. Distal phalanx of the toe amputated. Incision appears healed and with no signs of infection. The distal toe does have mild erythema over it. Very tender and sensitive  to palpation over the entire toe, pain with any movement of the toe.  Neurological: He is alert and oriented to person, place, and time.  Skin: Skin is warm and dry.  Psychiatric: He has a normal mood and affect.    ED Course  Procedures (including critical care time)  X-ray obtained to look for signs of osteomyelitis. It is negative. Cannot exclude a small are, MRI needed. Pt otherwise in NAD, afebrile. WIll d/c  home with Dr. August Saucer for follow up.   Medical screening examination/treatment/procedure(s) were performed by non-physician practitioner and as supervising physician I was immediately available for consultation/collaboration. Osvaldo Human, M.D.       Lottie Mussel, PA 01/10/12 1031  Carleene Cooper III, MD 01/12/12 [phone]

## 2012-01-18 ENCOUNTER — Other Ambulatory Visit: Payer: Self-pay | Admitting: Infectious Disease

## 2012-01-18 DIAGNOSIS — Z79899 Other long term (current) drug therapy: Secondary | ICD-10-CM

## 2012-01-26 ENCOUNTER — Other Ambulatory Visit: Payer: Medicare Other

## 2012-01-28 ENCOUNTER — Telehealth: Payer: Self-pay | Admitting: *Deleted

## 2012-01-28 NOTE — Telephone Encounter (Signed)
Called patient to remind him of the lab appt he missed. Had to leave message for the patient.

## 2012-01-31 ENCOUNTER — Encounter (HOSPITAL_COMMUNITY): Payer: Self-pay | Admitting: Nurse Practitioner

## 2012-01-31 ENCOUNTER — Emergency Department (HOSPITAL_COMMUNITY)
Admission: EM | Admit: 2012-01-31 | Discharge: 2012-01-31 | Disposition: A | Discharge status: Home / Self Care | Payer: Medicare Other | Attending: Emergency Medicine | Admitting: Emergency Medicine

## 2012-01-31 DIAGNOSIS — Z79899 Other long term (current) drug therapy: Secondary | ICD-10-CM | POA: Insufficient documentation

## 2012-01-31 DIAGNOSIS — K0889 Other specified disorders of teeth and supporting structures: Secondary | ICD-10-CM

## 2012-01-31 DIAGNOSIS — Z21 Asymptomatic human immunodeficiency virus [HIV] infection status: Secondary | ICD-10-CM | POA: Insufficient documentation

## 2012-01-31 DIAGNOSIS — K089 Disorder of teeth and supporting structures, unspecified: Secondary | ICD-10-CM | POA: Insufficient documentation

## 2012-01-31 DIAGNOSIS — F319 Bipolar disorder, unspecified: Secondary | ICD-10-CM | POA: Insufficient documentation

## 2012-01-31 DIAGNOSIS — I10 Essential (primary) hypertension: Secondary | ICD-10-CM | POA: Insufficient documentation

## 2012-01-31 DIAGNOSIS — K029 Dental caries, unspecified: Secondary | ICD-10-CM | POA: Insufficient documentation

## 2012-01-31 MED ORDER — OXYCODONE-ACETAMINOPHEN 5-325 MG PO TABS
2.0000 | ORAL_TABLET | Freq: Once | ORAL | Status: AC
  Administered 2012-01-31: 2 via ORAL
  Filled 2012-01-31: qty 2

## 2012-01-31 MED ORDER — HYDROCODONE-ACETAMINOPHEN 5-500 MG PO TABS: 1.0000 | ORAL_TABLET | Freq: Four times a day (QID) | ORAL | Status: AC | PRN

## 2012-01-31 MED ORDER — PENICILLIN V POTASSIUM 500 MG PO TABS: 500.0000 mg | ORAL_TABLET | Freq: Four times a day (QID) | ORAL | Status: DC

## 2012-01-31 MED ORDER — PENICILLIN V POTASSIUM 250 MG PO TABS
500.0000 mg | ORAL_TABLET | Freq: Once | ORAL | Status: AC
  Administered 2012-01-31: 500 mg via ORAL
  Filled 2012-01-31: qty 2

## 2012-01-31 NOTE — ED Provider Notes (Signed)
History     CSN: [id number]  Arrival date & time 01/31/12  1303   First MD Initiated Contact with Patient 01/31/12 1448      Chief Complaint  Patient presents with  . Dental Pain    (Consider location/radiation/quality/duration/timing/severity/associated sxs/prior treatment) HPI Comments: Has appt with dentist on 2/21  Patient is a 41 y.o. male presenting with tooth pain. The history is provided by the patient. No language interpreter was used.  Dental PainPrimary symptoms do not include mouth pain, dental injury, oral bleeding, oral lesions, headaches, fever, shortness of breath, sore throat or cough. The symptoms began more than 1 week ago (2 weeks ago). The symptoms are worsening. The symptoms are new. The symptoms occur intermittently.  Additional symptoms include: dental sensitivity to temperature and gum tenderness. Additional symptoms do not include: gum swelling, purulent gums, trismus, jaw pain, facial swelling, trouble swallowing, pain with swallowing, ear pain and fatigue.    Past Medical History  Diagnosis Date  . Immune deficiency disorder   . Bipolar disorder   . Hypertension   . HIV (human immunodeficiency virus infection)     Past Surgical History  Procedure Date  . Cervical fusion   . Amputation 11/04/2011    Procedure: AMPUTATION DIGIT;  Surgeon: Cammy Copa;  Location: [facility] OR;  Service: Orthopedics;  Laterality: Right;  Right Second Toe Distal Phalanx Amputation    History reviewed. No pertinent family history.  History  Substance Use Topics  . Smoking status: Current Everyday Smoker -- 0.4 packs/day for 15 years  . Smokeless tobacco: Never Used  . Alcohol Use: No     rarely      Review of Systems  Constitutional: Negative for fever, activity change, appetite change and fatigue.  HENT: Positive for dental problem. Negative for ear pain, congestion, sore throat, facial swelling, rhinorrhea, trouble swallowing, neck pain and neck stiffness.     Respiratory: Negative for cough and shortness of breath.   Cardiovascular: Negative for chest pain and palpitations.  Gastrointestinal: Negative for nausea, vomiting and abdominal pain.  Genitourinary: Negative for dysuria, urgency, frequency and flank pain.  Musculoskeletal: Negative for myalgias, back pain and arthralgias.  Neurological: Negative for dizziness, weakness, numbness and headaches.  All other systems reviewed and are negative.    Allergies  Nsaids and Ultram  Home Medications   Current Outpatient Rx  Name Route Sig Dispense Refill  . ATAZANAVIR SULFATE 300 MG PO CAPS Oral Take 1 capsule (300 mg total) by mouth daily with breakfast. 30 capsule 0  . EMTRICITABINE-TENOFOVIR 200-300 MG PO TABS Oral Take 1 tablet by mouth daily. 30 tablet 0  . HYDROCODONE-ACETAMINOPHEN 5-325 MG PO TABS Oral Take 1-2 tablets by mouth every 4 (four) hours as needed. For pain    . RITONAVIR 100 MG PO TABS Oral Take 1 tablet (100 mg total) by mouth daily. 30 tablet 11  . HYDROCODONE-ACETAMINOPHEN 5-500 MG PO TABS Oral Take 1-2 tablets by mouth every 6 (six) hours as needed for pain. 15 tablet 0  . PENICILLIN V POTASSIUM 500 MG PO TABS Oral Take 1 tablet (500 mg total) by mouth 4 (four) times daily. 40 tablet 0    BP 121/83  Pulse 81  Temp(Src) 98 F (36.7 C) (Oral)  Resp 15  Ht 5\' 8"  (1.727 m)  Wt 165 lb (74.844 kg)  BMI 25.09 kg/m2  SpO2 99%  Physical Exam  Nursing note and vitals reviewed. Constitutional: He is oriented to person, place, and time. He appears  well-developed and well-nourished.  HENT:  Head: Normocephalic and atraumatic. No trismus in the jaw.  Mouth/Throat: Oropharynx is clear and moist. Abnormal dentition. Dental caries present. No dental abscesses. No oropharyngeal exudate.         No facial swelling or sublingual swelling  Eyes: Conjunctivae and EOM are normal. Pupils are equal, round, and reactive to light.  Neck: Normal range of motion. Neck supple.   Cardiovascular: Normal rate, regular rhythm, normal heart sounds and intact distal pulses.  Exam reveals no gallop and no friction rub.   No murmur heard. Pulmonary/Chest: Effort normal and breath sounds normal. No respiratory distress.  Abdominal: Soft. Bowel sounds are normal. There is no tenderness.  Musculoskeletal: Normal range of motion. He exhibits no tenderness.  Neurological: He is alert and oriented to person, place, and time. No cranial nerve deficit.  Skin: Skin is warm and dry. No rash noted.    ED Course  Procedures (including critical care time)  Labs Reviewed - No data to display No results found.   1. Dental caries   2. Dentalgia       MDM  Dental caries with associated dentalgia. There is no evidence of dental abscess. There is no indication of facial swelling or ludwigs angina. Because of exposed root he'll be placed on penicillin. Pain medication. Instructed to followup as scheduled with his dentist on 2/21        Dayton Bailiff, MD 01/31/12 [phone]

## 2012-01-31 NOTE — ED Notes (Signed)
Pt c/o upper L toothache x 2 weeks, worse today. Called dentist but they have no appt to see him until 2/21.

## 2012-02-05 ENCOUNTER — Emergency Department (HOSPITAL_COMMUNITY)
Admission: EM | Admit: 2012-02-05 | Discharge: 2012-02-05 | Disposition: A | Discharge status: Home / Self Care | Payer: Medicare Other | Attending: Emergency Medicine | Admitting: Emergency Medicine

## 2012-02-05 ENCOUNTER — Encounter (HOSPITAL_COMMUNITY): Payer: Self-pay | Admitting: Emergency Medicine

## 2012-02-05 DIAGNOSIS — Z21 Asymptomatic human immunodeficiency virus [HIV] infection status: Secondary | ICD-10-CM | POA: Insufficient documentation

## 2012-02-05 DIAGNOSIS — I10 Essential (primary) hypertension: Secondary | ICD-10-CM | POA: Insufficient documentation

## 2012-02-05 DIAGNOSIS — F319 Bipolar disorder, unspecified: Secondary | ICD-10-CM | POA: Insufficient documentation

## 2012-02-05 DIAGNOSIS — Z79899 Other long term (current) drug therapy: Secondary | ICD-10-CM | POA: Insufficient documentation

## 2012-02-05 DIAGNOSIS — K0889 Other specified disorders of teeth and supporting structures: Secondary | ICD-10-CM

## 2012-02-05 DIAGNOSIS — K089 Disorder of teeth and supporting structures, unspecified: Secondary | ICD-10-CM | POA: Insufficient documentation

## 2012-02-05 DIAGNOSIS — K029 Dental caries, unspecified: Secondary | ICD-10-CM | POA: Insufficient documentation

## 2012-02-05 MED ORDER — OXYCODONE-ACETAMINOPHEN 5-325 MG PO TABS
1.0000 | ORAL_TABLET | Freq: Once | ORAL | Status: AC
  Administered 2012-02-05: 1 via ORAL
  Filled 2012-02-05: qty 1

## 2012-02-05 MED ORDER — HYDROCODONE-ACETAMINOPHEN 7.5-500 MG/15ML PO SOLN: 10.0000 mL | Freq: Four times a day (QID) | ORAL | Status: AC | PRN
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
## 2012-02-09 ENCOUNTER — Ambulatory Visit: Payer: Medicare Other | Admitting: Infectious Disease

## 2012-03-11 ENCOUNTER — Encounter (HOSPITAL_COMMUNITY): Payer: Self-pay | Admitting: Emergency Medicine

## 2012-03-11 ENCOUNTER — Emergency Department (HOSPITAL_COMMUNITY)
Admission: EM | Admit: 2012-03-11 | Discharge: 2012-03-11 | Disposition: A | Discharge status: Home / Self Care | Payer: Medicare Other | Attending: Emergency Medicine | Admitting: Emergency Medicine

## 2012-03-11 ENCOUNTER — Emergency Department (HOSPITAL_COMMUNITY): Payer: Medicare Other

## 2012-03-11 DIAGNOSIS — Z79899 Other long term (current) drug therapy: Secondary | ICD-10-CM | POA: Insufficient documentation

## 2012-03-11 DIAGNOSIS — I10 Essential (primary) hypertension: Secondary | ICD-10-CM | POA: Insufficient documentation

## 2012-03-11 DIAGNOSIS — M542 Cervicalgia: Secondary | ICD-10-CM | POA: Insufficient documentation

## 2012-03-11 DIAGNOSIS — Z21 Asymptomatic human immunodeficiency virus [HIV] infection status: Secondary | ICD-10-CM | POA: Insufficient documentation

## 2012-03-11 DIAGNOSIS — R209 Unspecified disturbances of skin sensation: Secondary | ICD-10-CM | POA: Insufficient documentation

## 2012-03-11 DIAGNOSIS — F319 Bipolar disorder, unspecified: Secondary | ICD-10-CM | POA: Insufficient documentation

## 2012-03-11 MED ORDER — OXYCODONE-ACETAMINOPHEN 5-325 MG PO TABS: 2.0000 | ORAL_TABLET | Freq: Four times a day (QID) | ORAL | Status: AC | PRN

## 2012-03-11 MED ORDER — IBUPROFEN 800 MG PO TABS
800.0000 mg | ORAL_TABLET | Freq: Once | ORAL | Status: DC
  Filled 2012-03-11: qty 1

## 2012-03-11 MED ORDER — OXYCODONE-ACETAMINOPHEN 5-325 MG PO TABS
2.0000 | ORAL_TABLET | Freq: Once | ORAL | Status: AC
  Administered 2012-03-11: 2 via ORAL
  Filled 2012-03-11: qty 2

## 2012-03-11 NOTE — Discharge Instructions (Signed)
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
## 2012-03-15 ENCOUNTER — Other Ambulatory Visit: Payer: Self-pay | Admitting: *Deleted

## 2012-03-15 DIAGNOSIS — B20 Human immunodeficiency virus [HIV] disease: Secondary | ICD-10-CM

## 2012-03-15 MED ORDER — ATAZANAVIR SULFATE 300 MG PO CAPS: 300.0000 mg | ORAL_CAPSULE | Freq: Every day | ORAL | Status: DC

## 2012-03-15 MED ORDER — EMTRICITABINE-TENOFOVIR DF 200-300 MG PO TABS: 1.0000 | ORAL_TABLET | Freq: Every day | ORAL | Status: DC

## 2012-03-15 MED ORDER — RITONAVIR 100 MG PO TABS: 100.0000 mg | ORAL_TABLET | Freq: Every day | ORAL | Status: DC

## 2012-03-15 NOTE — Telephone Encounter (Signed)
Patient called and advised he needs refills on his medications. 

## 2012-03-28 ENCOUNTER — Other Ambulatory Visit: Payer: Medicare Other

## 2012-04-11 ENCOUNTER — Telehealth: Payer: Self-pay | Admitting: *Deleted

## 2012-04-11 ENCOUNTER — Ambulatory Visit: Payer: Medicare Other | Admitting: Internal Medicine

## 2012-04-11 NOTE — Telephone Encounter (Signed)
Patient referred to [facility] for multiple no shows.  Last labs were done 10/2011. Wendall Mola CMA

## 2012-07-06 ENCOUNTER — Emergency Department (HOSPITAL_COMMUNITY): Payer: Medicare Other

## 2012-07-06 ENCOUNTER — Encounter (HOSPITAL_COMMUNITY): Payer: Self-pay | Admitting: Emergency Medicine

## 2012-07-06 ENCOUNTER — Emergency Department (HOSPITAL_COMMUNITY)
Admission: EM | Admit: 2012-07-06 | Discharge: 2012-07-06 | Disposition: A | Discharge status: Home Health | Payer: Medicare Other | Attending: Emergency Medicine | Admitting: Emergency Medicine

## 2012-07-06 DIAGNOSIS — Y92410 Unspecified street and highway as the place of occurrence of the external cause: Secondary | ICD-10-CM | POA: Insufficient documentation

## 2012-07-06 DIAGNOSIS — Z981 Arthrodesis status: Secondary | ICD-10-CM | POA: Insufficient documentation

## 2012-07-06 DIAGNOSIS — F17200 Nicotine dependence, unspecified, uncomplicated: Secondary | ICD-10-CM | POA: Insufficient documentation

## 2012-07-06 DIAGNOSIS — S161XXA Strain of muscle, fascia and tendon at neck level, initial encounter: Secondary | ICD-10-CM

## 2012-07-06 DIAGNOSIS — Z21 Asymptomatic human immunodeficiency virus [HIV] infection status: Secondary | ICD-10-CM | POA: Insufficient documentation

## 2012-07-06 DIAGNOSIS — S139XXA Sprain of joints and ligaments of unspecified parts of neck, initial encounter: Secondary | ICD-10-CM | POA: Insufficient documentation

## 2012-07-06 MED ORDER — CYCLOBENZAPRINE HCL 10 MG PO TABS: 10.0000 mg | ORAL_TABLET | Freq: Two times a day (BID) | ORAL | Status: AC | PRN

## 2012-07-06 MED ORDER — HYDROCODONE-ACETAMINOPHEN 5-500 MG PO TABS: 1.0000 | ORAL_TABLET | Freq: Four times a day (QID) | ORAL | Status: AC | PRN

## 2012-07-06 MED ORDER — HYDROCODONE-ACETAMINOPHEN 5-325 MG PO TABS
2.0000 | ORAL_TABLET | Freq: Once | ORAL | Status: AC
  Administered 2012-07-06: 2 via ORAL
  Filled 2012-07-06: qty 2
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
## 2012-07-13 ENCOUNTER — Other Ambulatory Visit: Payer: Medicare Other

## 2012-07-13 ENCOUNTER — Other Ambulatory Visit (INDEPENDENT_AMBULATORY_CARE_PROVIDER_SITE_OTHER): Payer: Medicare Other

## 2012-07-13 DIAGNOSIS — Z113 Encounter for screening for infections with a predominantly sexual mode of transmission: Secondary | ICD-10-CM

## 2012-07-13 DIAGNOSIS — B20 Human immunodeficiency virus [HIV] disease: Secondary | ICD-10-CM

## 2012-07-13 LAB — CBC WITH DIFFERENTIAL/PLATELET
Basophils Absolute: 0 10*3/uL (ref 0.0–0.1)
Basophils Relative: 1 % (ref 0–1)
Eosinophils Relative: 3 % (ref 0–5)
Lymphocytes Relative: 59 % — ABNORMAL HIGH (ref 12–46)
MCV: 71.5 fL — ABNORMAL LOW (ref 78.0–100.0)
Neutro Abs: 1.1 10*3/uL — ABNORMAL LOW (ref 1.7–7.7)
Platelets: 231 10*3/uL (ref 150–400)
RDW: 14.7 % (ref 11.5–15.5)
WBC: 3.8 10*3/uL — ABNORMAL LOW (ref 4.0–10.5)

## 2012-07-14 LAB — COMPLETE METABOLIC PANEL WITH GFR
Alkaline Phosphatase: 77 U/L (ref 39–117)
Creat: 1.2 mg/dL (ref 0.50–1.35)
GFR, Est Non African American: 76 mL/min
Glucose, Bld: 95 mg/dL (ref 70–99)
Sodium: 136 mEq/L (ref 135–145)
Total Bilirubin: 2.1 mg/dL — ABNORMAL HIGH (ref 0.3–1.2)
Total Protein: 6.9 g/dL (ref 6.0–8.3)

## 2012-07-14 LAB — T-HELPER CELL (CD4) - (RCID CLINIC ONLY)
CD4 % Helper T Cell: 31 % — ABNORMAL LOW (ref 33–55)
CD4 T Cell Abs: 710 uL (ref 400–2700)

## 2012-07-14 LAB — RPR

## 2012-07-27 ENCOUNTER — Emergency Department (HOSPITAL_COMMUNITY): Payer: Medicare Other

## 2012-07-27 ENCOUNTER — Telehealth: Payer: Self-pay | Admitting: *Deleted

## 2012-07-27 ENCOUNTER — Encounter (HOSPITAL_COMMUNITY): Payer: Self-pay

## 2012-07-27 ENCOUNTER — Emergency Department (HOSPITAL_COMMUNITY)
Admission: EM | Admit: 2012-07-27 | Discharge: 2012-07-27 | Disposition: A | Discharge status: Home / Self Care | Payer: Medicare Other | Attending: Emergency Medicine | Admitting: Emergency Medicine

## 2012-07-27 ENCOUNTER — Ambulatory Visit: Payer: Medicare Other | Admitting: Internal Medicine

## 2012-07-27 DIAGNOSIS — F319 Bipolar disorder, unspecified: Secondary | ICD-10-CM | POA: Insufficient documentation

## 2012-07-27 DIAGNOSIS — Z21 Asymptomatic human immunodeficiency virus [HIV] infection status: Secondary | ICD-10-CM | POA: Insufficient documentation

## 2012-07-27 DIAGNOSIS — S8000XA Contusion of unspecified knee, initial encounter: Secondary | ICD-10-CM | POA: Insufficient documentation

## 2012-07-27 DIAGNOSIS — M25569 Pain in unspecified knee: Secondary | ICD-10-CM | POA: Insufficient documentation

## 2012-07-27 DIAGNOSIS — I10 Essential (primary) hypertension: Secondary | ICD-10-CM | POA: Insufficient documentation

## 2012-07-27 DIAGNOSIS — F17200 Nicotine dependence, unspecified, uncomplicated: Secondary | ICD-10-CM | POA: Insufficient documentation

## 2012-07-27 DIAGNOSIS — W19XXXA Unspecified fall, initial encounter: Secondary | ICD-10-CM

## 2012-07-27 DIAGNOSIS — S139XXA Sprain of joints and ligaments of unspecified parts of neck, initial encounter: Secondary | ICD-10-CM | POA: Insufficient documentation

## 2012-07-27 DIAGNOSIS — S161XXA Strain of muscle, fascia and tendon at neck level, initial encounter: Secondary | ICD-10-CM

## 2012-07-27 DIAGNOSIS — M542 Cervicalgia: Secondary | ICD-10-CM | POA: Insufficient documentation

## 2012-07-27 DIAGNOSIS — R404 Transient alteration of awareness: Secondary | ICD-10-CM | POA: Insufficient documentation

## 2012-07-27 MED ORDER — CYCLOBENZAPRINE HCL 10 MG PO TABS: 10.0000 mg | ORAL_TABLET | Freq: Two times a day (BID) | ORAL | Status: AC | PRN

## 2012-07-27 MED ORDER — HYDROCODONE-ACETAMINOPHEN 5-325 MG PO TABS
1.0000 | ORAL_TABLET | Freq: Once | ORAL | Status: AC
  Administered 2012-07-27: 1 via ORAL
  Filled 2012-07-27: qty 1

## 2012-07-27 NOTE — Telephone Encounter (Signed)
Patient no showed his appt today, he is active with [facility].  Notified Amy that he no showed appt. Gary [name]

## 2012-07-27 NOTE — ED Notes (Addendum)
Pt in X ray
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
## 2012-08-03 ENCOUNTER — Telehealth: Payer: Self-pay | Admitting: Infectious Disease

## 2012-08-09 NOTE — Telephone Encounter (Signed)
.  mpt
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
## 2012-11-14 ENCOUNTER — Other Ambulatory Visit: Payer: Medicare Other

## 2012-11-28 ENCOUNTER — Ambulatory Visit: Payer: Medicare Other | Admitting: Internal Medicine

## 2012-11-28 ENCOUNTER — Telehealth: Payer: Self-pay | Admitting: *Deleted

## 2012-11-28 NOTE — Telephone Encounter (Signed)
Called and left voice mail for patient to reschedule appt, he no showed today. Also referred to [facility] counseling for multiple no shows, last seen in 2012. Wendall Mola

## 2012-12-08 ENCOUNTER — Encounter: Payer: Self-pay | Admitting: Physical Medicine and Rehabilitation

## 2012-12-21 ENCOUNTER — Other Ambulatory Visit: Payer: Medicare Other

## 2012-12-22 ENCOUNTER — Other Ambulatory Visit: Payer: Medicare Other

## 2012-12-29 ENCOUNTER — Emergency Department (HOSPITAL_COMMUNITY): Payer: Medicare Other

## 2012-12-29 ENCOUNTER — Encounter (HOSPITAL_COMMUNITY): Payer: Self-pay | Admitting: Emergency Medicine

## 2012-12-29 ENCOUNTER — Emergency Department (HOSPITAL_COMMUNITY)
Admission: EM | Admit: 2012-12-29 | Discharge: 2012-12-29 | Disposition: A | Discharge status: Home / Self Care | Payer: Medicare Other | Attending: Emergency Medicine | Admitting: Emergency Medicine

## 2012-12-29 DIAGNOSIS — Y92410 Unspecified street and highway as the place of occurrence of the external cause: Secondary | ICD-10-CM | POA: Insufficient documentation

## 2012-12-29 DIAGNOSIS — F17200 Nicotine dependence, unspecified, uncomplicated: Secondary | ICD-10-CM | POA: Insufficient documentation

## 2012-12-29 DIAGNOSIS — IMO0002 Reserved for concepts with insufficient information to code with codable children: Secondary | ICD-10-CM | POA: Insufficient documentation

## 2012-12-29 DIAGNOSIS — M79671 Pain in right foot: Secondary | ICD-10-CM

## 2012-12-29 DIAGNOSIS — T07XXXA Unspecified multiple injuries, initial encounter: Secondary | ICD-10-CM

## 2012-12-29 DIAGNOSIS — Z79899 Other long term (current) drug therapy: Secondary | ICD-10-CM | POA: Insufficient documentation

## 2012-12-29 DIAGNOSIS — B20 Human immunodeficiency virus [HIV] disease: Secondary | ICD-10-CM

## 2012-12-29 DIAGNOSIS — Z9889 Other specified postprocedural states: Secondary | ICD-10-CM | POA: Insufficient documentation

## 2012-12-29 DIAGNOSIS — Z981 Arthrodesis status: Secondary | ICD-10-CM | POA: Insufficient documentation

## 2012-12-29 DIAGNOSIS — M62838 Other muscle spasm: Secondary | ICD-10-CM

## 2012-12-29 DIAGNOSIS — F319 Bipolar disorder, unspecified: Secondary | ICD-10-CM | POA: Insufficient documentation

## 2012-12-29 DIAGNOSIS — Z21 Asymptomatic human immunodeficiency virus [HIV] infection status: Secondary | ICD-10-CM | POA: Insufficient documentation

## 2012-12-29 DIAGNOSIS — Y9389 Activity, other specified: Secondary | ICD-10-CM | POA: Insufficient documentation

## 2012-12-29 DIAGNOSIS — S0993XA Unspecified injury of face, initial encounter: Secondary | ICD-10-CM | POA: Insufficient documentation

## 2012-12-29 DIAGNOSIS — S8990XA Unspecified injury of unspecified lower leg, initial encounter: Secondary | ICD-10-CM | POA: Insufficient documentation

## 2012-12-29 DIAGNOSIS — I10 Essential (primary) hypertension: Secondary | ICD-10-CM | POA: Insufficient documentation

## 2012-12-29 DIAGNOSIS — S98139A Complete traumatic amputation of one unspecified lesser toe, initial encounter: Secondary | ICD-10-CM | POA: Insufficient documentation

## 2012-12-29 DIAGNOSIS — G8929 Other chronic pain: Secondary | ICD-10-CM

## 2012-12-29 MED ORDER — METHOCARBAMOL 500 MG PO TABS: 500.0000 mg | ORAL_TABLET | Freq: Two times a day (BID) | ORAL | Status: DC

## 2012-12-29 MED ORDER — HYDROCODONE-ACETAMINOPHEN 5-325 MG PO TABS
2.0000 | ORAL_TABLET | Freq: Once | ORAL | Status: AC
  Administered 2012-12-29: 2 via ORAL
  Filled 2012-12-29: qty 2

## 2012-12-29 MED ORDER — CEPHALEXIN 500 MG PO CAPS: 500.0000 mg | ORAL_CAPSULE | Freq: Four times a day (QID) | ORAL | Status: DC

## 2012-12-29 MED ORDER — HYDROCODONE-ACETAMINOPHEN 5-500 MG PO TABS: 1.0000 | ORAL_TABLET | Freq: Four times a day (QID) | ORAL | Status: DC | PRN

## 2012-12-29 NOTE — ED Provider Notes (Signed)
Medical screening examination/treatment/procedure(s) were performed by non-physician practitioner and as supervising physician I was immediately available for consultation/collaboration.   Leeon Makar, MD 12/29/12 1746 

## 2012-12-29 NOTE — ED Notes (Signed)
Patient transported to X-ray 
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
## 2012-12-29 NOTE — Progress Notes (Signed)
Orthopedic Tech Progress Note Patient Details:  Gary [name] 01-11-1971 [id number]  Ortho Devices Type of Ortho Device: Postop shoe/boot Ortho Device/Splint Location: right foot Ortho Device/Splint Interventions: Application   Zachariah Pavek 12/29/2012, 4:13 PM

## 2012-12-29 NOTE — ED Notes (Signed)
Ortho paged. 
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
## 2013-01-04 ENCOUNTER — Ambulatory Visit: Payer: Medicare Other | Admitting: Internal Medicine

## 2013-01-04 ENCOUNTER — Telehealth: Payer: Self-pay | Admitting: *Deleted

## 2013-01-04 NOTE — Telephone Encounter (Signed)
Patient no showed appt today, has already been referred to [facility] in December.  [facility] notified Wendall Mola

## 2013-01-17 ENCOUNTER — Ambulatory Visit: Payer: Medicare Other | Admitting: Physical Medicine and Rehabilitation

## 2013-01-24 ENCOUNTER — Emergency Department (HOSPITAL_COMMUNITY)
Admission: EM | Admit: 2013-01-24 | Discharge: 2013-01-24 | Disposition: A | Discharge status: Home / Self Care | Payer: Medicare Other | Attending: Emergency Medicine | Admitting: Emergency Medicine

## 2013-01-24 ENCOUNTER — Emergency Department (HOSPITAL_COMMUNITY): Payer: Medicare Other

## 2013-01-24 ENCOUNTER — Encounter (HOSPITAL_COMMUNITY): Payer: Self-pay | Admitting: *Deleted

## 2013-01-24 DIAGNOSIS — Y92009 Unspecified place in unspecified non-institutional (private) residence as the place of occurrence of the external cause: Secondary | ICD-10-CM | POA: Insufficient documentation

## 2013-01-24 DIAGNOSIS — F17200 Nicotine dependence, unspecified, uncomplicated: Secondary | ICD-10-CM | POA: Insufficient documentation

## 2013-01-24 DIAGNOSIS — S161XXA Strain of muscle, fascia and tendon at neck level, initial encounter: Secondary | ICD-10-CM

## 2013-01-24 DIAGNOSIS — S139XXA Sprain of joints and ligaments of unspecified parts of neck, initial encounter: Secondary | ICD-10-CM | POA: Insufficient documentation

## 2013-01-24 DIAGNOSIS — Z21 Asymptomatic human immunodeficiency virus [HIV] infection status: Secondary | ICD-10-CM | POA: Insufficient documentation

## 2013-01-24 DIAGNOSIS — Y939 Activity, unspecified: Secondary | ICD-10-CM | POA: Insufficient documentation

## 2013-01-24 DIAGNOSIS — S8990XA Unspecified injury of unspecified lower leg, initial encounter: Secondary | ICD-10-CM | POA: Insufficient documentation

## 2013-01-24 DIAGNOSIS — I10 Essential (primary) hypertension: Secondary | ICD-10-CM | POA: Insufficient documentation

## 2013-01-24 DIAGNOSIS — W1809XA Striking against other object with subsequent fall, initial encounter: Secondary | ICD-10-CM | POA: Insufficient documentation

## 2013-01-24 DIAGNOSIS — S0990XA Unspecified injury of head, initial encounter: Secondary | ICD-10-CM | POA: Insufficient documentation

## 2013-01-24 DIAGNOSIS — M79673 Pain in unspecified foot: Secondary | ICD-10-CM

## 2013-01-24 DIAGNOSIS — Z79899 Other long term (current) drug therapy: Secondary | ICD-10-CM | POA: Insufficient documentation

## 2013-01-24 DIAGNOSIS — W19XXXA Unspecified fall, initial encounter: Secondary | ICD-10-CM

## 2013-01-24 DIAGNOSIS — F319 Bipolar disorder, unspecified: Secondary | ICD-10-CM | POA: Insufficient documentation

## 2013-01-24 LAB — CBC
HCT: 40.8 % (ref 39.0–52.0)
Hemoglobin: 13 g/dL (ref 13.0–17.0)
MCH: 22.9 pg — ABNORMAL LOW (ref 26.0–34.0)
MCHC: 31.9 g/dL (ref 30.0–36.0)
MCV: 72 fL — ABNORMAL LOW (ref 78.0–100.0)
RBC: 5.67 MIL/uL (ref 4.22–5.81)

## 2013-01-24 LAB — BASIC METABOLIC PANEL
BUN: 8 mg/dL (ref 6–23)
CO2: 26 mEq/L (ref 19–32)
Calcium: 9.3 mg/dL (ref 8.4–10.5)
Creatinine, Ser: 1.02 mg/dL (ref 0.50–1.35)
Glucose, Bld: 97 mg/dL (ref 70–99)
Sodium: 136 mEq/L (ref 135–145)

## 2013-01-24 MED ORDER — METAXALONE 800 MG PO TABS: 800.0000 mg | ORAL_TABLET | Freq: Three times a day (TID) | ORAL | Status: DC | PRN

## 2013-01-24 MED ORDER — OXYCODONE-ACETAMINOPHEN 5-325 MG PO TABS: 1.0000 | ORAL_TABLET | Freq: Four times a day (QID) | ORAL | Status: DC | PRN

## 2013-01-24 MED ORDER — OXYCODONE-ACETAMINOPHEN 5-325 MG PO TABS
1.0000 | ORAL_TABLET | Freq: Once | ORAL | Status: AC
  Administered 2013-01-24: 1 via ORAL
  Filled 2013-01-24: qty 1

## 2013-01-24 NOTE — ED Notes (Signed)
Placed in [place] collar for severe neck pain
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
## 2013-01-24 NOTE — ED Notes (Signed)
Dr. Rubin Payor in to see pt on arrival to room.  No distress noted.
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
## 2013-01-24 NOTE — ED Notes (Signed)
MD at bedside. 

## 2013-02-07 ENCOUNTER — Ambulatory Visit: Payer: Medicare Other | Admitting: Physical Medicine and Rehabilitation

## 2013-02-18 ENCOUNTER — Encounter (HOSPITAL_COMMUNITY): Payer: Self-pay | Admitting: Emergency Medicine

## 2013-02-18 ENCOUNTER — Emergency Department (HOSPITAL_COMMUNITY): Payer: Medicare Other

## 2013-02-18 ENCOUNTER — Emergency Department (HOSPITAL_COMMUNITY)
Admission: EM | Admit: 2013-02-18 | Discharge: 2013-02-18 | Disposition: A | Discharge status: Home / Self Care | Payer: Medicare Other | Attending: Emergency Medicine | Admitting: Emergency Medicine

## 2013-02-18 DIAGNOSIS — Z8659 Personal history of other mental and behavioral disorders: Secondary | ICD-10-CM | POA: Insufficient documentation

## 2013-02-18 DIAGNOSIS — M79641 Pain in right hand: Secondary | ICD-10-CM

## 2013-02-18 DIAGNOSIS — Z21 Asymptomatic human immunodeficiency virus [HIV] infection status: Secondary | ICD-10-CM | POA: Insufficient documentation

## 2013-02-18 DIAGNOSIS — Z87828 Personal history of other (healed) physical injury and trauma: Secondary | ICD-10-CM | POA: Insufficient documentation

## 2013-02-18 DIAGNOSIS — S0993XA Unspecified injury of face, initial encounter: Secondary | ICD-10-CM | POA: Insufficient documentation

## 2013-02-18 DIAGNOSIS — I10 Essential (primary) hypertension: Secondary | ICD-10-CM | POA: Insufficient documentation

## 2013-02-18 DIAGNOSIS — S199XXA Unspecified injury of neck, initial encounter: Secondary | ICD-10-CM | POA: Insufficient documentation

## 2013-02-18 DIAGNOSIS — Y9389 Activity, other specified: Secondary | ICD-10-CM | POA: Insufficient documentation

## 2013-02-18 DIAGNOSIS — Y9289 Other specified places as the place of occurrence of the external cause: Secondary | ICD-10-CM | POA: Insufficient documentation

## 2013-02-18 DIAGNOSIS — F17200 Nicotine dependence, unspecified, uncomplicated: Secondary | ICD-10-CM | POA: Insufficient documentation

## 2013-02-18 DIAGNOSIS — S6990XA Unspecified injury of unspecified wrist, hand and finger(s), initial encounter: Secondary | ICD-10-CM | POA: Insufficient documentation

## 2013-02-18 DIAGNOSIS — Z79899 Other long term (current) drug therapy: Secondary | ICD-10-CM | POA: Insufficient documentation

## 2013-02-18 MED ORDER — ACETAMINOPHEN 500 MG PO TABS
1000.0000 mg | ORAL_TABLET | Freq: Once | ORAL | Status: AC
  Administered 2013-02-18: 1000 mg via ORAL
  Filled 2013-02-18: qty 2
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
## 2013-02-20 NOTE — ED Provider Notes (Signed)
Medical screening examination/treatment/procedure(s) were performed by non-physician practitioner and as supervising physician I was immediately available for consultation/collaboration.  Martha K Linker, MD 02/20/13 0659 

## 2013-02-21 ENCOUNTER — Encounter
Payer: No Typology Code available for payment source | Attending: Physical Medicine and Rehabilitation | Admitting: Physical Medicine and Rehabilitation

## 2013-03-02 ENCOUNTER — Encounter (HOSPITAL_COMMUNITY): Payer: Self-pay | Admitting: *Deleted

## 2013-03-02 ENCOUNTER — Emergency Department (HOSPITAL_COMMUNITY): Payer: Medicare Other

## 2013-03-02 ENCOUNTER — Emergency Department (HOSPITAL_COMMUNITY)
Admission: EM | Admit: 2013-03-02 | Discharge: 2013-03-02 | Disposition: A | Discharge status: Home / Self Care | Payer: Medicare Other | Attending: Emergency Medicine | Admitting: Emergency Medicine

## 2013-03-02 DIAGNOSIS — Z8639 Personal history of other endocrine, nutritional and metabolic disease: Secondary | ICD-10-CM | POA: Insufficient documentation

## 2013-03-02 DIAGNOSIS — S199XXA Unspecified injury of neck, initial encounter: Secondary | ICD-10-CM | POA: Insufficient documentation

## 2013-03-02 DIAGNOSIS — M542 Cervicalgia: Secondary | ICD-10-CM

## 2013-03-02 DIAGNOSIS — Z8659 Personal history of other mental and behavioral disorders: Secondary | ICD-10-CM | POA: Insufficient documentation

## 2013-03-02 DIAGNOSIS — R51 Headache: Secondary | ICD-10-CM | POA: Insufficient documentation

## 2013-03-02 DIAGNOSIS — R55 Syncope and collapse: Secondary | ICD-10-CM | POA: Insufficient documentation

## 2013-03-02 DIAGNOSIS — T7492XA Unspecified child maltreatment, confirmed, initial encounter: Secondary | ICD-10-CM | POA: Insufficient documentation

## 2013-03-02 DIAGNOSIS — T7491XA Unspecified adult maltreatment, confirmed, initial encounter: Secondary | ICD-10-CM | POA: Insufficient documentation

## 2013-03-02 DIAGNOSIS — Z21 Asymptomatic human immunodeficiency virus [HIV] infection status: Secondary | ICD-10-CM | POA: Insufficient documentation

## 2013-03-02 DIAGNOSIS — I10 Essential (primary) hypertension: Secondary | ICD-10-CM | POA: Insufficient documentation

## 2013-03-02 DIAGNOSIS — Z79899 Other long term (current) drug therapy: Secondary | ICD-10-CM | POA: Insufficient documentation

## 2013-03-02 DIAGNOSIS — S0993XA Unspecified injury of face, initial encounter: Secondary | ICD-10-CM | POA: Insufficient documentation

## 2013-03-02 DIAGNOSIS — Z862 Personal history of diseases of the blood and blood-forming organs and certain disorders involving the immune mechanism: Secondary | ICD-10-CM | POA: Insufficient documentation

## 2013-03-02 DIAGNOSIS — F17200 Nicotine dependence, unspecified, uncomplicated: Secondary | ICD-10-CM | POA: Insufficient documentation

## 2013-03-02 DIAGNOSIS — IMO0002 Reserved for concepts with insufficient information to code with codable children: Secondary | ICD-10-CM | POA: Insufficient documentation

## 2013-03-02 MED ORDER — CYCLOBENZAPRINE HCL 10 MG PO TABS: 10.0000 mg | ORAL_TABLET | Freq: Two times a day (BID) | ORAL | Status: DC | PRN

## 2013-03-02 MED ORDER — OXYCODONE-ACETAMINOPHEN 5-325 MG PO TABS
2.0000 | ORAL_TABLET | Freq: Once | ORAL | Status: AC
  Administered 2013-03-02: 2 via ORAL
  Filled 2013-03-02: qty 2

## 2013-03-02 NOTE — ED Provider Notes (Signed)
Medical screening examination/treatment/procedure(s) were performed by non-physician practitioner and as supervising physician I was immediately available for consultation/collaboration.  Flint Melter, MD 03/02/13 2158
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
## 2013-03-02 NOTE — ED Notes (Signed)
PA sts hold discharging pt.
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
## 2013-04-05 ENCOUNTER — Telehealth: Payer: Self-pay | Admitting: *Deleted

## 2013-04-05 NOTE — Telephone Encounter (Signed)
Pharmacy called and advised the patient was there asking for refills on his 042 medication. He advised they have not filled it since early 2013. Advised them we have not seen him since 2012 and he will need to call the office and make an appt asap. He advised will inform the patient.

## 2013-04-09 ENCOUNTER — Encounter: Payer: Self-pay | Admitting: Licensed Clinical Social Worker

## 2013-04-10 ENCOUNTER — Other Ambulatory Visit: Payer: Self-pay

## 2013-04-18 ENCOUNTER — Other Ambulatory Visit: Payer: Self-pay | Admitting: Internal Medicine

## 2013-04-18 ENCOUNTER — Other Ambulatory Visit (INDEPENDENT_AMBULATORY_CARE_PROVIDER_SITE_OTHER): Payer: Medicare Other

## 2013-04-18 DIAGNOSIS — I10 Essential (primary) hypertension: Secondary | ICD-10-CM

## 2013-04-18 DIAGNOSIS — B20 Human immunodeficiency virus [HIV] disease: Secondary | ICD-10-CM

## 2013-04-18 DIAGNOSIS — Z113 Encounter for screening for infections with a predominantly sexual mode of transmission: Secondary | ICD-10-CM

## 2013-04-18 LAB — CBC WITH DIFFERENTIAL/PLATELET
Basophils Absolute: 0 10*3/uL (ref 0.0–0.1)
Eosinophils Relative: 2 % (ref 0–5)
Lymphocytes Relative: 35 % (ref 12–46)
MCV: 70.2 fL — ABNORMAL LOW (ref 78.0–100.0)
Platelets: 223 10*3/uL (ref 150–400)
RDW: 16.4 % — ABNORMAL HIGH (ref 11.5–15.5)
WBC: 6.1 10*3/uL (ref 4.0–10.5)

## 2013-04-18 LAB — COMPREHENSIVE METABOLIC PANEL
ALT: 16 U/L (ref 0–53)
CO2: 24 mEq/L (ref 19–32)
Calcium: 9.5 mg/dL (ref 8.4–10.5)
Chloride: 103 mEq/L (ref 96–112)
Potassium: 4.7 mEq/L (ref 3.5–5.3)
Sodium: 136 mEq/L (ref 135–145)
Total Bilirubin: 0.6 mg/dL (ref 0.3–1.2)
Total Protein: 8.6 g/dL — ABNORMAL HIGH (ref 6.0–8.3)

## 2013-04-19 LAB — T-HELPER CELL (CD4) - (RCID CLINIC ONLY): CD4 % Helper T Cell: 24 % — ABNORMAL LOW (ref 33–55)

## 2013-04-19 LAB — HIV-1 RNA QUANT-NO REFLEX-BLD: HIV-1 RNA Quant, Log: 4.81 {Log} — ABNORMAL HIGH (ref <1.30)

## 2013-05-02 ENCOUNTER — Ambulatory Visit: Payer: Medicare Other | Admitting: Infectious Disease

## 2013-05-16 ENCOUNTER — Telehealth: Payer: Self-pay | Admitting: *Deleted

## 2013-05-16 ENCOUNTER — Ambulatory Visit: Payer: Medicare Other | Admitting: Infectious Disease

## 2013-05-16 NOTE — Telephone Encounter (Signed)
Pt no-showed today's appt.  Pt has not been seen since 2012, last refilled 042 meds early 2013.  Pt had labs drawn 4/30. Attempted to call pt, no working numbers.  Will re-refer back to [facility].Marland Kitchen Andree Coss, RN

## 2013-06-04 ENCOUNTER — Ambulatory Visit (INDEPENDENT_AMBULATORY_CARE_PROVIDER_SITE_OTHER): Payer: Medicare Other | Admitting: Internal Medicine

## 2013-06-04 ENCOUNTER — Encounter: Payer: Self-pay | Admitting: Internal Medicine

## 2013-06-04 VITALS — BP 133/86 | HR 62 | Temp 98.6°F | Ht 68.0 in | Wt 176.0 lb

## 2013-06-04 DIAGNOSIS — B20 Human immunodeficiency virus [HIV] disease: Secondary | ICD-10-CM

## 2013-06-04 MED ORDER — RITONAVIR 100 MG PO TABS: 100.0000 mg | ORAL_TABLET | Freq: Every day | ORAL | Status: DC

## 2013-06-04 MED ORDER — ATAZANAVIR SULFATE 300 MG PO CAPS: 300.0000 mg | ORAL_CAPSULE | Freq: Every day | ORAL | Status: DC

## 2013-06-04 MED ORDER — EMTRICITABINE-TENOFOVIR DF 200-300 MG PO TABS: 1.0000 | ORAL_TABLET | Freq: Every day | ORAL | Status: DC

## 2013-06-04 NOTE — Assessment & Plan Note (Signed)
She is interested in restarting treatment and I will start him on the same. I will check his labs in about 3 weeks and also check a genotype to assure no developing resistance I did discuss resistance and need to take it every day. I will avoid a one pill a day regimen since he does endorse missing medications.

## 2013-06-04 NOTE — Progress Notes (Signed)
  Subjective:    Patient ID: Gary [name], male    DOB: 09-27-1971, 42 y.o.   MRN: [id number]  HPI He comes in for followup after a long absence. He ran out of his medicines in about October and has not had had refills 22 not coming back to clinic. He was on Reyataz, Norvir and Truvada. He feels well and no new issues. He he is eager to restart medications. No weight loss. No problems with his medications previously. He does endorse occasional missed doses.  He is considering moving though is explore his options at this time. He also is having left jaw pain and has noted that his jaw is off-line. This has been ongoing for years. No diarrhea, no joint aches.   Review of Systems  Constitutional: Negative for fatigue.  HENT: Negative for sore throat and trouble swallowing.   Respiratory: Negative for cough and shortness of breath.   Cardiovascular: Negative for chest pain.  Gastrointestinal: Negative for nausea and diarrhea.  Musculoskeletal: Negative for myalgias, joint swelling and arthralgias.  Skin: Negative for rash.  Neurological: Negative for dizziness and headaches.  Hematological: Negative for adenopathy.  Psychiatric/Behavioral: The patient is not nervous/anxious.        Objective:   Physical Exam  Constitutional: He appears well-developed and well-nourished. No distress.  HENT:  Mouth/Throat: Oropharynx is clear and moist. No oropharyngeal exudate.  Cardiovascular: Normal rate, regular rhythm and normal heart sounds.   No murmur heard. Pulmonary/Chest: Effort normal and breath sounds normal. No respiratory distress. He has no wheezes.  Abdominal: Soft. Bowel sounds are normal. He exhibits no distension. There is no tenderness.  Lymphadenopathy:    He has no cervical adenopathy.  Skin: Skin is warm and dry. No rash noted.  Psychiatric: He has a normal mood and affect. His behavior is normal.          Assessment & Plan:

## 2013-06-25 ENCOUNTER — Ambulatory Visit: Payer: Medicare Other

## 2013-07-03 ENCOUNTER — Encounter: Payer: Self-pay | Admitting: Internal Medicine

## 2013-07-03 ENCOUNTER — Ambulatory Visit (INDEPENDENT_AMBULATORY_CARE_PROVIDER_SITE_OTHER): Payer: Medicare Other | Admitting: Internal Medicine

## 2013-07-03 VITALS — BP 128/85 | HR 58 | Temp 98.3°F | Ht 68.0 in | Wt 176.0 lb

## 2013-07-03 DIAGNOSIS — R197 Diarrhea, unspecified: Secondary | ICD-10-CM

## 2013-07-03 DIAGNOSIS — B20 Human immunodeficiency virus [HIV] disease: Secondary | ICD-10-CM

## 2013-07-03 LAB — COMPLETE METABOLIC PANEL WITH GFR
Albumin: 4.1 g/dL (ref 3.5–5.2)
BUN: 11 mg/dL (ref 6–23)
CO2: 24 mEq/L (ref 19–32)
Calcium: 9 mg/dL (ref 8.4–10.5)
Chloride: 109 mEq/L (ref 96–112)
GFR, Est African American: >89 mL/min
GFR, Est Non African American: 79 mL/min
Glucose, Bld: 92 mg/dL (ref 70–99)
Potassium: 4.5 mEq/L (ref 3.5–5.3)

## 2013-07-03 LAB — CBC WITH DIFFERENTIAL/PLATELET
Basophils Absolute: 0 10*3/uL (ref 0.0–0.1)
Basophils Relative: 1 % (ref 0–1)
Lymphocytes Relative: 50 % — ABNORMAL HIGH (ref 12–46)
Neutro Abs: 1.5 10*3/uL — ABNORMAL LOW (ref 1.7–7.7)
Platelets: 199 10*3/uL (ref 150–400)
RDW: 15.7 % — ABNORMAL HIGH (ref 11.5–15.5)
WBC: 3.8 10*3/uL — ABNORMAL LOW (ref 4.0–10.5)

## 2013-07-03 NOTE — Progress Notes (Signed)
  Subjective:    Patient ID: Gary [name], male    DOB: [birth year], 42 y.o.   MRN: [id number]  HPI He comes in for followup after restarting his regimen. He restarted Reyataz, Norvir and Truvada. He is been on this for one month and denies any missed doses. He is motivated to continue taking his medications and is pleased with how he feels. He is exercising and has had a 4 pound purposeful weight loss. No issues with medications. He has some mild diarrhea since restarting. He did have a rash on his abdomen that has resolved. No other rash.   Review of Systems  Constitutional: Negative for activity change, appetite change, fatigue and unexpected weight change.  HENT: Negative for sore throat and trouble swallowing.   Respiratory: Negative for cough and shortness of breath.   Cardiovascular: Negative for leg swelling.  Gastrointestinal: Positive for diarrhea. Negative for nausea and abdominal pain.  Musculoskeletal: Negative for myalgias and arthralgias.  Skin: Negative for rash.  Neurological: Negative for dizziness, light-headedness and headaches.  Hematological: Negative for adenopathy.  Psychiatric/Behavioral: Negative for dysphoric mood.       Objective:   Physical Exam  Constitutional: He appears well-developed and well-nourished. No distress.  HENT:  Mouth/Throat: Oropharynx is clear and moist. No oropharyngeal exudate.  Eyes: Right eye exhibits no discharge. Left eye exhibits no discharge. No scleral icterus.  Cardiovascular: Normal rate, regular rhythm and normal heart sounds.   No murmur heard. Pulmonary/Chest: Effort normal and breath sounds normal. No respiratory distress. He has no wheezes. He has no rales.  Lymphadenopathy:    He has no cervical adenopathy.  Skin: Skin is warm and dry. No rash noted.  Psychiatric: He has a normal mood and affect. His behavior is normal.          Assessment & Plan:

## 2013-07-03 NOTE — Assessment & Plan Note (Signed)
He is doing well on the regimen and will continue. I will check his labs today including genotype to assure no resistance. He otherwise will return in 2 months. He will be called though sooner if there any issues with his labs today. He'll check his labs with the visit next time.

## 2013-07-03 NOTE — Assessment & Plan Note (Signed)
I will watch his diarrhea but with no significant unexpected weight loss no indication for any treatment at this time.

## 2013-07-04 LAB — T-HELPER CELL (CD4) - (RCID CLINIC ONLY): CD4 % Helper T Cell: 31 % — ABNORMAL LOW (ref 33–55)

## 2013-08-14 ENCOUNTER — Encounter (HOSPITAL_COMMUNITY): Payer: Self-pay | Admitting: Emergency Medicine

## 2013-08-14 ENCOUNTER — Emergency Department (HOSPITAL_COMMUNITY)
Admission: EM | Admit: 2013-08-14 | Discharge: 2013-08-14 | Disposition: A | Discharge status: Home / Self Care | Payer: Medicare Other | Attending: Emergency Medicine | Admitting: Emergency Medicine

## 2013-08-14 DIAGNOSIS — D849 Immunodeficiency, unspecified: Secondary | ICD-10-CM | POA: Insufficient documentation

## 2013-08-14 DIAGNOSIS — I10 Essential (primary) hypertension: Secondary | ICD-10-CM | POA: Insufficient documentation

## 2013-08-14 DIAGNOSIS — Z8659 Personal history of other mental and behavioral disorders: Secondary | ICD-10-CM | POA: Insufficient documentation

## 2013-08-14 DIAGNOSIS — K0889 Other specified disorders of teeth and supporting structures: Secondary | ICD-10-CM

## 2013-08-14 DIAGNOSIS — Z21 Asymptomatic human immunodeficiency virus [HIV] infection status: Secondary | ICD-10-CM | POA: Insufficient documentation

## 2013-08-14 DIAGNOSIS — Z79899 Other long term (current) drug therapy: Secondary | ICD-10-CM | POA: Insufficient documentation

## 2013-08-14 DIAGNOSIS — K08109 Complete loss of teeth, unspecified cause, unspecified class: Secondary | ICD-10-CM | POA: Insufficient documentation

## 2013-08-14 DIAGNOSIS — K089 Disorder of teeth and supporting structures, unspecified: Secondary | ICD-10-CM | POA: Insufficient documentation

## 2013-08-14 DIAGNOSIS — F17200 Nicotine dependence, unspecified, uncomplicated: Secondary | ICD-10-CM | POA: Insufficient documentation

## 2013-08-14 MED ORDER — HYDROCODONE-ACETAMINOPHEN 5-325 MG PO TABS
2.0000 | ORAL_TABLET | Freq: Once | ORAL | Status: AC
  Administered 2013-08-14: 2 via ORAL
  Filled 2013-08-14: qty 2

## 2013-08-14 MED ORDER — HYDROCODONE-ACETAMINOPHEN 5-325 MG PO TABS: 1.0000 | ORAL_TABLET | Freq: Four times a day (QID) | ORAL | Status: DC | PRN

## 2013-08-14 NOTE — ED Notes (Signed)
Patient states had a tooth L upper side removed this morning.   Patient states that the dentist would not give him any pain medication.   Patient states he can't take NSAIDS.  Patient states that he was wanting pain medication.  Patient states tylenol will not work.  Patient did not take any before he came to ED.   Patient said he normally takes percocets.

## 2013-08-14 NOTE — ED Provider Notes (Signed)
CSN: [id number]     Arrival date & time 08/14/13  1028 History   First MD Initiated Contact with Patient 08/14/13 1035     Chief Complaint  Patient presents with  . Dental Pain   (Consider location/radiation/quality/duration/timing/severity/associated sxs/prior Treatment) HPI Comments: Patient presents with dental pain that has been present since this morning.  He reports that he was seen by his Dentist this morning and had a left lower molar tooth extracted.  He states that the dentist did not discharge him home with any pain medication.  He denies fever or chills.  He reports a small amount of bleeding from the area, but no other discharge.  Pain worse with palpation.  The history is provided by the patient.    Past Medical History  Diagnosis Date  . Immune deficiency disorder   . Bipolar disorder   . Hypertension   . HIV (human immunodeficiency virus infection)   . HIV (human immunodeficiency virus infection)    Past Surgical History  Procedure Laterality Date  . Cervical fusion    . Amputation  11/04/2011    Procedure: AMPUTATION DIGIT;  Surgeon: Cammy Copa;  Location: [facility] OR;  Service: Orthopedics;  Laterality: Right;  Right Second Toe Distal Phalanx Amputation  . Bunionectomy     No family history on file. History  Substance Use Topics  . Smoking status: Current Every Day Smoker -- 0.25 packs/day for 15 years  . Smokeless tobacco: Never Used  . Alcohol Use: No     Comment: 2-3 times a month    Review of Systems  Constitutional: Negative for fever and chills.  HENT: Positive for dental problem. Negative for neck pain and neck stiffness.   All other systems reviewed and are negative.    Allergies  Nsaids and Ultram  Home Medications   Current Outpatient Rx  Name  Route  Sig  Dispense  Refill  . atazanavir (REYATAZ) 300 MG capsule   Oral   Take 1 capsule (300 mg total) by mouth daily.   30 capsule   5   . emtricitabine-tenofovir (TRUVADA) 200-300 MG  per tablet   Oral   Take 1 tablet by mouth daily.   30 tablet   5   . ritonavir (NORVIR) 100 MG TABS   Oral   Take 1 tablet (100 mg total) by mouth daily.   30 tablet   5   . acetaminophen (TYLENOL) 500 MG tablet   Oral   Take 1,000 mg by mouth 3 (three) times daily as needed for pain. For pain          BP 152/131  Pulse 98  Temp(Src) 99 F (37.2 C) (Oral)  Ht 5\' 8"  (1.727 m)  Wt 174 lb (78.926 kg)  BMI 26.46 kg/m2  SpO2 99% Physical Exam  Nursing note and vitals reviewed. Constitutional: He appears well-developed and well-nourished.  HENT:  Head: Normocephalic and atraumatic.  Mouth/Throat: Uvula is midline and oropharynx is clear and moist. No trismus in the jaw.  Left lower molar tooth extracted, gingiva actively bleeding small amount of blood No trismus   Neck: Normal range of motion. Neck supple.  Cardiovascular: Normal rate, regular rhythm and normal heart sounds.   Pulmonary/Chest: Effort normal and breath sounds normal.  Neurological: He is alert.  Skin: Skin is warm and dry.  Psychiatric: He has a normal mood and affect.    ED Course  Procedures (including critical care time) Labs Review Labs Reviewed - No data to  display Imaging Review No results found.  Looked up patient in the Narcotic Database.  No recent narcotic prescriptions.  Pharmacy Tech called patient's pharmacy.  No recent narcotic prescriptions.  MDM  No diagnosis found. Patient presents with dental pain after having his tooth extracted earlier today.  No signs of infection at this time.  Patient stable for discharge.  Patient requesting referral to another dentist.    Pascal Lux [place], PA-C 08/14/13 1530

## 2013-08-15 NOTE — ED Provider Notes (Signed)
Medical screening examination/treatment/procedure(s) were performed by non-physician practitioner and as supervising physician I was immediately available for consultation/collaboration.   Laray Anger, DO 08/15/13 2058

## 2013-09-04 ENCOUNTER — Telehealth: Payer: Self-pay | Admitting: *Deleted

## 2013-09-04 ENCOUNTER — Ambulatory Visit: Payer: Medicare Other | Admitting: Internal Medicine

## 2013-09-04 NOTE — Telephone Encounter (Signed)
Vikki Ports: This patient should be rescheduled per DR. Comer even though he has had multiple no shows. Thanks Asher Muir

## 2013-09-04 NOTE — Telephone Encounter (Signed)
Attempted to call patient regarding his no show appt today and got his mother in law. Did not leave a message. Per Dr. Luciana Axe this is his 3rd no show, however he would like for him to be rescheduled when he calls as he has just started meds. Gary [name]

## 2013-09-05 ENCOUNTER — Encounter (HOSPITAL_COMMUNITY): Payer: Self-pay | Admitting: *Deleted

## 2013-09-05 ENCOUNTER — Emergency Department (HOSPITAL_COMMUNITY)
Admission: EM | Admit: 2013-09-05 | Discharge: 2013-09-05 | Disposition: A | Discharge status: Home / Self Care | Payer: Medicare Other | Attending: Emergency Medicine | Admitting: Emergency Medicine

## 2013-09-05 DIAGNOSIS — M25512 Pain in left shoulder: Secondary | ICD-10-CM

## 2013-09-05 DIAGNOSIS — Z21 Asymptomatic human immunodeficiency virus [HIV] infection status: Secondary | ICD-10-CM | POA: Insufficient documentation

## 2013-09-05 DIAGNOSIS — F17200 Nicotine dependence, unspecified, uncomplicated: Secondary | ICD-10-CM | POA: Insufficient documentation

## 2013-09-05 DIAGNOSIS — Y9289 Other specified places as the place of occurrence of the external cause: Secondary | ICD-10-CM | POA: Insufficient documentation

## 2013-09-05 DIAGNOSIS — Y9389 Activity, other specified: Secondary | ICD-10-CM | POA: Insufficient documentation

## 2013-09-05 DIAGNOSIS — Z8659 Personal history of other mental and behavioral disorders: Secondary | ICD-10-CM | POA: Insufficient documentation

## 2013-09-05 DIAGNOSIS — S46909A Unspecified injury of unspecified muscle, fascia and tendon at shoulder and upper arm level, unspecified arm, initial encounter: Secondary | ICD-10-CM | POA: Insufficient documentation

## 2013-09-05 DIAGNOSIS — S4980XA Other specified injuries of shoulder and upper arm, unspecified arm, initial encounter: Secondary | ICD-10-CM | POA: Insufficient documentation

## 2013-09-05 DIAGNOSIS — X500XXA Overexertion from strenuous movement or load, initial encounter: Secondary | ICD-10-CM | POA: Insufficient documentation

## 2013-09-05 DIAGNOSIS — Z79899 Other long term (current) drug therapy: Secondary | ICD-10-CM | POA: Insufficient documentation

## 2013-09-05 DIAGNOSIS — I10 Essential (primary) hypertension: Secondary | ICD-10-CM | POA: Insufficient documentation

## 2013-09-05 DIAGNOSIS — R209 Unspecified disturbances of skin sensation: Secondary | ICD-10-CM | POA: Insufficient documentation

## 2013-09-05 MED ORDER — HYDROCODONE-ACETAMINOPHEN 5-325 MG PO TABS
2.0000 | ORAL_TABLET | Freq: Once | ORAL | Status: AC
  Administered 2013-09-05: 2 via ORAL
  Filled 2013-09-05: qty 2

## 2013-09-05 MED ORDER — HYDROCODONE-ACETAMINOPHEN 5-325 MG PO TABS: 2.0000 | ORAL_TABLET | Freq: Four times a day (QID) | ORAL | Status: DC | PRN

## 2013-09-05 MED ORDER — PROMETHAZINE HCL 25 MG PO TABS: 25.0000 mg | ORAL_TABLET | Freq: Four times a day (QID) | ORAL | Status: DC | PRN

## 2013-09-05 NOTE — ED Notes (Signed)
Pt's wife called out to say "he's crying in pain". PA informed.
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
## 2013-09-05 NOTE — ED Notes (Signed)
Pt called out again for pain medicine. PA informed. Order received.
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
## 2013-09-05 NOTE — ED Notes (Signed)
Pt c/o left shoulder pain x 1 week. States was seen at [facility] and has seen orthopedist here in [place]. States the ortho MD said "they couldn't do anything for him until he had an MRI".

## 2013-09-07 NOTE — ED Provider Notes (Signed)
Medical screening examination/treatment/procedure(s) were performed by non-physician practitioner and as supervising physician I was immediately available for consultation/collaboration.   Shelda Jakes, MD 09/07/13 [phone]

## 2013-09-20 DIAGNOSIS — M542 Cervicalgia: Secondary | ICD-10-CM | POA: Insufficient documentation

## 2013-09-20 DIAGNOSIS — G894 Chronic pain syndrome: Secondary | ICD-10-CM | POA: Insufficient documentation

## 2013-09-20 DIAGNOSIS — M5412 Radiculopathy, cervical region: Secondary | ICD-10-CM | POA: Insufficient documentation

## 2013-10-23 ENCOUNTER — Telehealth: Payer: Self-pay | Admitting: *Deleted

## 2013-10-23 ENCOUNTER — Ambulatory Visit: Payer: Medicare Other | Admitting: Internal Medicine

## 2013-10-23 NOTE — Telephone Encounter (Signed)
Called patient and left message that he no showed yesterday's appt, he will need to come to the walk in clinic. Gary [name]

## 2013-10-25 ENCOUNTER — Other Ambulatory Visit: Payer: Self-pay

## 2013-10-30 ENCOUNTER — Encounter: Payer: Self-pay | Admitting: Internal Medicine

## 2013-10-30 ENCOUNTER — Ambulatory Visit (INDEPENDENT_AMBULATORY_CARE_PROVIDER_SITE_OTHER): Payer: Medicare Other | Admitting: Internal Medicine

## 2013-10-30 VITALS — BP 116/79 | HR 75 | Temp 98.4°F | Ht 68.0 in | Wt 169.0 lb

## 2013-10-30 DIAGNOSIS — Z23 Encounter for immunization: Secondary | ICD-10-CM

## 2013-10-30 DIAGNOSIS — Z79899 Other long term (current) drug therapy: Secondary | ICD-10-CM

## 2013-10-30 DIAGNOSIS — B20 Human immunodeficiency virus [HIV] disease: Secondary | ICD-10-CM

## 2013-10-30 DIAGNOSIS — F17200 Nicotine dependence, unspecified, uncomplicated: Secondary | ICD-10-CM

## 2013-10-30 DIAGNOSIS — Z113 Encounter for screening for infections with a predominantly sexual mode of transmission: Secondary | ICD-10-CM

## 2013-10-30 NOTE — Assessment & Plan Note (Signed)
He will get labs today. He does report good compliance and I did discuss options of one pill a day if he continues to do well on this regimen. She will return in 3 months. Since he has sporadic followup he prefers to call back for an appointment and he understands in January. If there are any concerns on his labs he will be called for a sooner appointment.

## 2013-10-30 NOTE — Assessment & Plan Note (Signed)
Not presently interested in quitting

## 2013-10-30 NOTE — Progress Notes (Signed)
  Subjective:    Patient ID: Gary [name], male    DOB: 10/22/1971, 42 y.o.   MRN: [id number]  HPI  He comes in for followup after restarting his regimen in May. He restarted Reyataz, Norvir and Truvada. He is been on this for now 6 months and endorses only 1 missed dose. He is motivated to continue taking his medications and is pleased with how he feels. He is exercising. No issues with medications. Wife is sick and He has some mild diarrhea since restarting. He did have a rash on his abdomen that has resolved. No other rash.   Review of Systems  Constitutional: Negative for activity change, appetite change, fatigue and unexpected weight change.  HENT: Negative for sore throat and trouble swallowing.   Eyes: Negative for visual disturbance.  Respiratory: Negative for cough and shortness of breath.   Cardiovascular: Negative for leg swelling.  Gastrointestinal: Negative for nausea, abdominal pain and diarrhea.  Musculoskeletal: Negative for arthralgias and myalgias.  Skin: Negative for rash.  Neurological: Negative for dizziness, light-headedness and headaches.  Hematological: Negative for adenopathy.  Psychiatric/Behavioral: Negative for dysphoric mood.       Objective:   Physical Exam  Constitutional: He appears well-developed and well-nourished. No distress.  HENT:  Mouth/Throat: Oropharynx is clear and moist. No oropharyngeal exudate.  Eyes: Right eye exhibits no discharge. Left eye exhibits no discharge. No scleral icterus.  Cardiovascular: Normal rate, regular rhythm and normal heart sounds.   No murmur heard. Pulmonary/Chest: Effort normal and breath sounds normal. No respiratory distress. He has no wheezes. He has no rales.  Lymphadenopathy:    He has no cervical adenopathy.  Skin: Skin is warm and dry. No rash noted.  Psychiatric: He has a normal mood and affect. His behavior is normal.          Assessment & Plan:

## 2013-10-31 LAB — CBC WITH DIFFERENTIAL/PLATELET
Basophils Relative: 1 % (ref 0–1)
Eosinophils Relative: 4 % (ref 0–5)
HCT: 41.4 % (ref 39.0–52.0)
Hemoglobin: 13.3 g/dL (ref 13.0–17.0)
MCHC: 32.1 g/dL (ref 30.0–36.0)
MCV: 72.1 fL — ABNORMAL LOW (ref 78.0–100.0)
Monocytes Absolute: 0.3 10*3/uL (ref 0.1–1.0)
Monocytes Relative: 7 % (ref 3–12)
Neutro Abs: 1.8 10*3/uL (ref 1.7–7.7)

## 2013-10-31 LAB — LIPID PANEL
HDL: 43 mg/dL (ref >39)
LDL Cholesterol: 97 mg/dL (ref 0–99)
Triglycerides: 103 mg/dL (ref <150)
VLDL: 21 mg/dL (ref 0–40)

## 2013-10-31 LAB — COMPLETE METABOLIC PANEL WITH GFR
ALT: 18 U/L (ref 0–53)
AST: 18 U/L (ref 0–37)
Albumin: 4.4 g/dL (ref 3.5–5.2)
Alkaline Phosphatase: 90 U/L (ref 39–117)
GFR, Est Non African American: 81 mL/min
Glucose, Bld: 93 mg/dL (ref 70–99)
Potassium: 5.2 mEq/L (ref 3.5–5.3)
Sodium: 138 mEq/L (ref 135–145)
Total Bilirubin: 0.9 mg/dL (ref 0.3–1.2)
Total Protein: 8.1 g/dL (ref 6.0–8.3)

## 2013-10-31 LAB — RPR

## 2013-11-01 ENCOUNTER — Telehealth: Payer: Self-pay | Admitting: *Deleted

## 2013-11-01 ENCOUNTER — Other Ambulatory Visit: Payer: Self-pay | Admitting: Internal Medicine

## 2013-11-01 DIAGNOSIS — B20 Human immunodeficiency virus [HIV] disease: Secondary | ICD-10-CM

## 2013-11-01 LAB — HIV-1 RNA QUANT-NO REFLEX-BLD
HIV 1 RNA Quant: 668 copies/mL — ABNORMAL HIGH (ref <20)
HIV-1 RNA Quant, Log: 2.82 {Log} — ABNORMAL HIGH (ref <1.30)

## 2013-11-01 MED ORDER — DOLUTEGRAVIR SODIUM 50 MG PO TABS: 50.0000 mg | ORAL_TABLET | Freq: Every day | ORAL | Status: DC

## 2013-11-01 NOTE — Telephone Encounter (Signed)
Message copied by Andree Coss on Thu Nov 01, 2013  4:05 PM ------      Message from: Gardiner Barefoot      Created: Thu Nov 01, 2013  1:49 PM       His virus is not suppressed despite his report of perfect compliance.  I need to add Tivicay to his regimen to take with the Reyataz, norvir and truvada.  It has been prescribed and he should start and get repeat labs in 2-3 weeks.  Thanks ------

## 2013-11-01 NOTE — Telephone Encounter (Signed)
Notified patient, he will pick up Tivicay tomorrow.  Lab appointment scheduled for 12/5.  Pt verbalized agreement. Andree Coss, RN

## 2013-11-23 ENCOUNTER — Other Ambulatory Visit: Payer: Medicare Other

## 2013-11-23 ENCOUNTER — Other Ambulatory Visit: Payer: Self-pay | Admitting: Internal Medicine

## 2013-11-23 ENCOUNTER — Telehealth: Payer: Self-pay | Admitting: *Deleted

## 2013-11-23 DIAGNOSIS — B20 Human immunodeficiency virus [HIV] disease: Secondary | ICD-10-CM

## 2013-11-23 NOTE — Telephone Encounter (Signed)
Ok, lets repeat his viral load with a genotype next week to see if he still is detectable before the appt.  No CD4 needed.  thanks

## 2013-11-23 NOTE — Telephone Encounter (Signed)
Patient called to say that he has stopped the Tivicay due to an all over body rash. I asked when he started the med because it was noted for him to start 11.13.14. He said he started it a week ago. He made an appt for 12/03/13 to discuss. Wendall Mola

## 2013-11-23 NOTE — Telephone Encounter (Signed)
Patient said he will just have to come in when he can arrange transportation and be added to the schedule. Gary [name]

## 2013-12-03 ENCOUNTER — Ambulatory Visit: Payer: Medicare Other | Admitting: Internal Medicine

## 2014-01-04 ENCOUNTER — Telehealth: Payer: Self-pay | Admitting: *Deleted

## 2014-01-04 ENCOUNTER — Encounter: Payer: Self-pay | Admitting: *Deleted

## 2014-01-04 NOTE — Telephone Encounter (Signed)
Left message asking him to call for a lab appointment.  Landis Gandy, RN

## 2014-01-04 NOTE — Telephone Encounter (Signed)
Message copied by Landis Gandy on Fri Jan 04, 2014  9:15 AM ------      Message from: Thayer Headings      Created: Thu Jan 03, 2014  5:05 PM       He needs a repeat viral load with genotype. I would actually prefer the monogram sensitive genotype for viral loads under 1000 or undetectable.  Thanks ------

## 2014-01-04 NOTE — Telephone Encounter (Signed)
Rescheduled lab appt to 01/08/14 for either morning or afternoon.  Message left for the pt to come that day either morning or afternoon.  Needing viral load / genotype per Dr. Henreitta Leber request.

## 2014-01-08 ENCOUNTER — Other Ambulatory Visit: Payer: Medicare Other

## 2014-01-30 ENCOUNTER — Other Ambulatory Visit: Payer: Medicare Other

## 2014-02-21 ENCOUNTER — Ambulatory Visit: Payer: Medicare Other | Admitting: Internal Medicine

## 2014-02-22 ENCOUNTER — Telehealth: Payer: Self-pay | Admitting: *Deleted

## 2014-02-22 NOTE — Telephone Encounter (Signed)
Patient has numerous no shows, needs labs and office visit.  RN left message with walk-in details, asked patient to give [place] a call at his earliest convenience for more information. Landis Gandy, RN

## 2014-02-28 ENCOUNTER — Other Ambulatory Visit: Payer: Medicare Other

## 2014-02-28 DIAGNOSIS — B20 Human immunodeficiency virus [HIV] disease: Secondary | ICD-10-CM

## 2014-02-28 LAB — CBC WITH DIFFERENTIAL/PLATELET
Basophils Absolute: 0 10*3/uL (ref 0.0–0.1)
Basophils Relative: 0 % (ref 0–1)
EOS ABS: 0 10*3/uL (ref 0.0–0.7)
Eosinophils Relative: 0 % (ref 0–5)
HCT: 43.7 % (ref 39.0–52.0)
HEMOGLOBIN: 14 g/dL (ref 13.0–17.0)
LYMPHS ABS: 2.5 10*3/uL (ref 0.7–4.0)
LYMPHS PCT: 29 % (ref 12–46)
MCH: 24.1 pg — AB (ref 26.0–34.0)
MCHC: 32 g/dL (ref 30.0–36.0)
MCV: 75.2 fL — ABNORMAL LOW (ref 78.0–100.0)
MONOS PCT: 6 % (ref 3–12)
Monocytes Absolute: 0.5 10*3/uL (ref 0.1–1.0)
NEUTROS ABS: 5.5 10*3/uL (ref 1.7–7.7)
NEUTROS PCT: 65 % (ref 43–77)
PLATELETS: 278 10*3/uL (ref 150–400)
RBC: 5.81 MIL/uL (ref 4.22–5.81)
RDW: 15.5 % (ref 11.5–15.5)
WBC: 8.5 10*3/uL (ref 4.0–10.5)

## 2014-02-28 LAB — COMPLETE METABOLIC PANEL WITH GFR
ALBUMIN: 4.4 g/dL (ref 3.5–5.2)
ALT: 40 U/L (ref 0–53)
AST: 27 U/L (ref 0–37)
Alkaline Phosphatase: 100 U/L (ref 39–117)
BUN: 10 mg/dL (ref 6–23)
CALCIUM: 9.3 mg/dL (ref 8.4–10.5)
CHLORIDE: 103 meq/L (ref 96–112)
CO2: 24 meq/L (ref 19–32)
Creat: 0.87 mg/dL (ref 0.50–1.35)
GLUCOSE: 93 mg/dL (ref 70–99)
POTASSIUM: 4.7 meq/L (ref 3.5–5.3)
SODIUM: 137 meq/L (ref 135–145)
TOTAL PROTEIN: 7.2 g/dL (ref 6.0–8.3)
Total Bilirubin: 1.4 mg/dL — ABNORMAL HIGH (ref 0.2–1.2)

## 2014-03-01 LAB — T-HELPER CELL (CD4) - (RCID CLINIC ONLY)
CD4 % Helper T Cell: 30 % — ABNORMAL LOW (ref 33–55)
CD4 T Cell Abs: 670 /uL (ref 400–2700)

## 2014-03-03 LAB — HIV-1 RNA QUANT-NO REFLEX-BLD
HIV 1 RNA QUANT: 31 {copies}/mL — AB (ref <20)
HIV-1 RNA Quant, Log: 1.49 {Log} — ABNORMAL HIGH (ref <1.30)

## 2014-06-13 ENCOUNTER — Other Ambulatory Visit: Payer: Self-pay | Admitting: Internal Medicine

## 2014-06-21 ENCOUNTER — Encounter (HOSPITAL_COMMUNITY): Payer: Self-pay | Admitting: Emergency Medicine

## 2014-06-21 ENCOUNTER — Emergency Department (HOSPITAL_COMMUNITY)
Admission: EM | Admit: 2014-06-21 | Discharge: 2014-06-21 | Disposition: A | Discharge status: Home / Self Care | Payer: Medicare Other | Attending: Emergency Medicine | Admitting: Emergency Medicine

## 2014-06-21 DIAGNOSIS — Z862 Personal history of diseases of the blood and blood-forming organs and certain disorders involving the immune mechanism: Secondary | ICD-10-CM | POA: Diagnosis not present

## 2014-06-21 DIAGNOSIS — F17200 Nicotine dependence, unspecified, uncomplicated: Secondary | ICD-10-CM | POA: Insufficient documentation

## 2014-06-21 DIAGNOSIS — I10 Essential (primary) hypertension: Secondary | ICD-10-CM | POA: Insufficient documentation

## 2014-06-21 DIAGNOSIS — Z8659 Personal history of other mental and behavioral disorders: Secondary | ICD-10-CM | POA: Diagnosis not present

## 2014-06-21 DIAGNOSIS — M5412 Radiculopathy, cervical region: Secondary | ICD-10-CM | POA: Diagnosis not present

## 2014-06-21 DIAGNOSIS — Z79899 Other long term (current) drug therapy: Secondary | ICD-10-CM | POA: Insufficient documentation

## 2014-06-21 DIAGNOSIS — Z21 Asymptomatic human immunodeficiency virus [HIV] infection status: Secondary | ICD-10-CM | POA: Insufficient documentation

## 2014-06-21 DIAGNOSIS — M542 Cervicalgia: Secondary | ICD-10-CM | POA: Diagnosis present

## 2014-06-21 DIAGNOSIS — Z8639 Personal history of other endocrine, nutritional and metabolic disease: Secondary | ICD-10-CM | POA: Insufficient documentation

## 2014-06-21 MED ORDER — OXYCODONE-ACETAMINOPHEN 5-325 MG PO TABS: 1.0000 | ORAL_TABLET | ORAL | Status: DC | PRN

## 2014-06-21 MED ORDER — OXYCODONE-ACETAMINOPHEN 5-325 MG PO TABS
2.0000 | ORAL_TABLET | Freq: Once | ORAL | Status: AC
  Administered 2014-06-21: 2 via ORAL
  Filled 2014-06-21: qty 2

## 2014-06-21 NOTE — ED Provider Notes (Signed)
CSN: [id number]     Arrival date & time 06/21/14  1259 History  This chart was scribed for non-physician practitioner, Clayton Bibles, PA-C,working with Virgel Manifold, MD, by Marlowe Kays, ED Scribe.  This patient was seen in room TR06C/TR06C and the patient's care was started at 1:32 PM.  Chief Complaint  Patient presents with  . Neck Pain   The history is provided by the patient. No language interpreter was used.   HPI Comments:  Gary [name] is a 43 y.o. HIV positive male who presents to the Emergency Department complaining of sudden onset burning pain, sharp shooting pain, and tingling that starts in his neck radiating down left arm and also into his back approximately one week ago. He reports his pain as "20"/10. He reports having these same symptoms before but this onset seems worse. He reports taking Zanaflex, Lyrica, and Tylenol for the pain.  He usually gets percocet 7.5-325 for his pain from the orthopedist but has not had transportation to [place] to follow up.  Pt states he was seeing an orthopedist for this pain and was referred to another specialist in [facility] but has not followed up. He denies fever, cough, numbness or weakness of lower extremities, SOB, urinary or bowel incontinence, abdominal pain, vomiting, or diarrhea. Pt states he is taking all his medications as prescribed. He denies any trauma, fall, or injury. Pt reports having C-Spine fusion surgery 22 years ago.  No hx IVDU or CA.   Past Medical History  Diagnosis Date  . Immune deficiency disorder   . Bipolar disorder   . Hypertension   . HIV (human immunodeficiency virus infection)   . HIV (human immunodeficiency virus infection)    Past Surgical History  Procedure Laterality Date  . Cervical fusion    . Amputation  11/04/2011    Procedure: AMPUTATION DIGIT;  Surgeon: Meredith Pel;  Location: [place];  Service: Orthopedics;  Laterality: Right;  Right Second Toe Distal Phalanx Amputation  . Bunionectomy      No family history on file. History  Substance Use Topics  . Smoking status: Current Every Day Smoker -- 0.40 packs/day for 15 years  . Smokeless tobacco: Never Used     Comment: cutting back, not ready to quit completely  . Alcohol Use: No     Comment: 2-3 times a month    Review of Systems A complete 10 system review of systems was obtained and all systems are negative except as noted in the HPI and PMH.   Allergies  Peach; Nsaids; and Ultram  Home Medications   Prior to Admission medications   Medication Sig Start Date End Date Taking? Authorizing Provider  acetaminophen (TYLENOL) 500 MG tablet Take 1,000 mg by mouth 3 (three) times daily as needed for pain. For pain    Historical Provider, MD  atazanavir (REYATAZ) 300 MG capsule Take 1 capsule (300 mg total) by mouth daily. 06/04/13   Thayer Headings, MD  dolutegravir (TIVICAY) 50 MG tablet Take 1 tablet (50 mg total) by mouth daily. 11/01/13   Thayer Headings, MD  emtricitabine-tenofovir (TRUVADA) 200-300 MG per tablet Take 1 tablet by mouth daily. 06/04/13   Thayer Headings, MD  oxyCODONE-acetaminophen (PERCOCET/ROXICET) 5-325 MG per tablet Take 1 tablet by mouth every 8 (eight) hours as needed for severe pain.    Historical Provider, MD  pregabalin (LYRICA) 25 MG capsule Take 25 mg by mouth 4 (four) times daily.    Historical Provider, MD  promethazine (  PHENERGAN) 25 MG tablet Take 1 tablet (25 mg total) by mouth every 6 (six) hours as needed for nausea. 09/05/13   Elwyn Lade, PA-C  ritonavir (NORVIR) 100 MG TABS Take 1 tablet (100 mg total) by mouth daily. 06/04/13   Thayer Headings, MD  tiZANidine (ZANAFLEX) 4 MG tablet Take 4 mg by mouth once as needed for muscle spasms.    Historical Provider, MD   Triage Vitals: BP 128/84  Pulse 75  Temp(Src) 98.2 F (36.8 C) (Oral)  SpO2 100% Physical Exam  Nursing note and vitals reviewed. Constitutional: He appears well-developed and well-nourished. No distress.  HENT:  Head:  Normocephalic and atraumatic.  Neck: Neck supple.  Cardiovascular:  Radial pulses intact.  Pulmonary/Chest: Effort normal.  Musculoskeletal:  Spine nontender, no crepitus, or stepoffs. Left paracervical tenderness through trapezius and left shoulder diffusely.  Neurological: He is alert.  Decreased intact sensations of LUE. Strength 5/5 BLE.  Skin: He is not diaphoretic.    ED Course  Procedures (including critical care time) DIAGNOSTIC STUDIES: Oxygen Saturation is 100% on RA, normal by my interpretation.   COORDINATION OF CARE: 1:39 PM- Will prescribe pain medication. Pt verbalizes understanding and agrees to plan.  Medications - No data to display  Labs Review Labs Reviewed - No data to display  Imaging Review No results found.   EKG Interpretation None      Last CD4 count 02/2014 was 670.   MDM   Final diagnoses:  Cervical radiculopathy    Pt with chronic neck pain and chronic left upper extremity radiculopathy presents with exacerbation and worsening of his chronic symptoms.  Pain has been ongoing for 7 days.  He has had no fevers, no weakness.  No other neurologic deficits.  NO new injury.  Has been taking zanaflex, lyrica, and tylenol.  Is out of percocet.  [place] checked, pt was getting regular refills of Percocet until 05/05/14 #120 7.5-325.  Apparently hasn't followed up because he is having a difficult time getting to [place].  Given no injury and no weakness and being out of medication, I suspect this is a pain control issue but with his worsening symptoms he does need to be seen by his orthopedist or a neurosurgeon.  I have given him local neurosurgery follow up and percocet for pain. Pt also given percocet in ED. Discussed return precautions.  Discussed result, findings, treatment, and follow up  with patient.  Pt given return precautions.  Pt verbalizes understanding and agrees with plan.      I personally performed the services described in this  documentation, which was scribed in my presence. The recorded information has been reviewed and is accurate.    Clayton Bibles, PA-C 06/21/14 1353
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
## 2014-06-21 NOTE — Discharge Instructions (Signed)
Read the information below.  Use the prescribed medication as directed.  Please discuss all new medications with your pharmacist.  Do not take additional tylenol while taking the prescribed pain medication to avoid overdose.  You may return to the Emergency Department at any time for worsening condition or any new symptoms that concern you.   If you develop fevers, loss of control of bowel or bladder, weakness in your arm, or are unable to walk, return to the ER for a recheck.     Cervical Radiculopathy Cervical radiculopathy happens when a nerve in the neck is pinched or bruised by a slipped (herniated) disk or by arthritic changes in the bones of the cervical spine. This can occur due to an injury or as part of the normal aging process. Pressure on the cervical nerves can cause pain or numbness that runs from your neck all the way down into your arm and fingers. CAUSES  There are many possible causes, including:  Injury.  Muscle tightness in the neck from overuse.  Swollen, painful joints (arthritis).  Breakdown or degeneration in the bones and joints of the spine (spondylosis) due to aging.  Bone spurs that may develop near the cervical nerves. SYMPTOMS  Symptoms include pain, weakness, or numbness in the affected arm and hand. Pain can be severe or irritating. Symptoms may be worse when extending or turning the neck. DIAGNOSIS  Your caregiver will ask about your symptoms and do a physical exam. He or she may test your strength and reflexes. X-rays, CT scans, and MRI scans may be needed in cases of injury or if the symptoms do not go away after a period of time. Electromyography (EMG) or nerve conduction testing may be done to study how your nerves and muscles are working. TREATMENT  Your caregiver may recommend certain exercises to help relieve your symptoms. Cervical radiculopathy can, and often does, get better with time and treatment. If your problems continue, treatment options may  include:  Wearing a soft collar for short periods of time.  Physical therapy to strengthen the neck muscles.  Medicines, such as nonsteroidal anti-inflammatory drugs (NSAIDs), oral corticosteroids, or spinal injections.  Surgery. Different types of surgery may be done depending on the cause of your problems. HOME CARE INSTRUCTIONS   Put ice on the affected area.  Put ice in a plastic bag.  Place a towel between your skin and the bag.  Leave the ice on for 15-20 minutes, 03-04 times a day or as directed by your caregiver.  If ice does not help, you can try using heat. Take a warm shower or bath, or use a hot water bottle as directed by your caregiver.  You may try a gentle neck and shoulder massage.  Use a flat pillow when you sleep.  Only take over-the-counter or prescription medicines for pain, discomfort, or fever as directed by your caregiver.  If physical therapy was prescribed, follow your caregiver's directions.  If a soft collar was prescribed, use it as directed. SEEK IMMEDIATE MEDICAL CARE IF:   Your pain gets much worse and cannot be controlled with medicines.  You have weakness or numbness in your hand, arm, face, or leg.  You have a high fever or a stiff, rigid neck.  You lose bowel or bladder control (incontinence).  You have trouble with walking, balance, or speaking. MAKE SURE YOU:   Understand these instructions.  Will watch your condition.  Will get help right away if you are not doing well  or get worse. Document Released: 08/31/2001 Document Revised: 02/28/2012 Document Reviewed: 07/20/2011 [facility] Patient Information 2015 [place], [place]. This information is not intended to replace advice given to you by your health care provider. Make sure you discuss any questions you have with your health care provider.

## 2014-06-23 NOTE — ED Provider Notes (Signed)
Medical screening examination/treatment/procedure(s) were performed by non-physician practitioner and as supervising physician I was immediately available for consultation/collaboration.   EKG Interpretation None       Virgel Manifold, MD 06/23/14 1102

## 2014-07-01 ENCOUNTER — Encounter: Payer: Self-pay | Admitting: Infectious Diseases

## 2014-07-01 ENCOUNTER — Ambulatory Visit (INDEPENDENT_AMBULATORY_CARE_PROVIDER_SITE_OTHER): Payer: Medicare Other | Admitting: Infectious Diseases

## 2014-07-01 VITALS — BP 143/97 | HR 56 | Temp 97.9°F | Ht 69.0 in | Wt 155.0 lb

## 2014-07-01 DIAGNOSIS — F17200 Nicotine dependence, unspecified, uncomplicated: Secondary | ICD-10-CM

## 2014-07-01 DIAGNOSIS — B20 Human immunodeficiency virus [HIV] disease: Secondary | ICD-10-CM

## 2014-07-01 MED ORDER — ATAZANAVIR-COBICISTAT 300-150 MG PO TABS: 1.0000 | ORAL_TABLET | Freq: Every day | ORAL | Status: DC

## 2014-07-01 NOTE — Assessment & Plan Note (Signed)
He is cutting back, encouraged pt.

## 2014-07-01 NOTE — Assessment & Plan Note (Signed)
He is doing well despite being off art. Will change him to evotaz and trv to see if we can help his adherence. He will f/u in 3 months. He has condoms.Marland Kitchen He is hep B immune.

## 2014-07-01 NOTE — Progress Notes (Signed)
   Subjective:    Patient ID: Gary [name], male    DOB: 1971-02-16, 43 y.o.   MRN: [id number]  HPI 43 yo M with hx of HIV+, previous difficulties med adherence. Prev on ATVr/TRV. Is moving to [place], getting separated from his wife. Has been off meds for 2 weeks. Did not have f/u so meds were not refilled.  Has been feeling well. Has been eating well, been running 3-5 miles qod. Has cut back on smoking (1/2 ppd to <1/4 ppd). Still smoking marijuana.  Has pain doctor who is helping him cut back his marijuana use.   HIV 1 RNA Quant (copies/mL)  Date Value  02/28/2014 31*  10/30/2013 668*  07/03/2013 394*     CD4 T Cell Abs (/uL)  Date Value  02/28/2014 670   07/03/2013 600   04/18/2013 510     Review of Systems  Constitutional: Negative for appetite change and unexpected weight change.  Respiratory: Negative for cough and shortness of breath.   Gastrointestinal: Negative for diarrhea and constipation.  Genitourinary: Negative for difficulty urinating.  Neurological: Negative for headaches.       Objective:   Physical Exam  Constitutional: He appears well-developed and well-nourished.  HENT:  Mouth/Throat: No oropharyngeal exudate.  Eyes: EOM are normal. Pupils are equal, round, and reactive to light.  Neck: Neck supple.  Cardiovascular: Normal rate, regular rhythm and normal heart sounds.   Pulmonary/Chest: Effort normal and breath sounds normal.  Abdominal: Soft. Bowel sounds are normal. There is no tenderness. There is no rebound.  Lymphadenopathy:    He has no cervical adenopathy.          Assessment & Plan:

## 2014-07-04 ENCOUNTER — Encounter (HOSPITAL_COMMUNITY): Payer: Self-pay | Admitting: Emergency Medicine

## 2014-07-04 ENCOUNTER — Emergency Department (HOSPITAL_COMMUNITY)
Admission: EM | Admit: 2014-07-04 | Discharge: 2014-07-04 | Disposition: A | Discharge status: Home / Self Care | Payer: Medicare Other | Attending: Emergency Medicine | Admitting: Emergency Medicine

## 2014-07-04 DIAGNOSIS — Z21 Asymptomatic human immunodeficiency virus [HIV] infection status: Secondary | ICD-10-CM | POA: Diagnosis not present

## 2014-07-04 DIAGNOSIS — M25569 Pain in unspecified knee: Secondary | ICD-10-CM | POA: Diagnosis present

## 2014-07-04 DIAGNOSIS — I10 Essential (primary) hypertension: Secondary | ICD-10-CM | POA: Insufficient documentation

## 2014-07-04 DIAGNOSIS — F17200 Nicotine dependence, unspecified, uncomplicated: Secondary | ICD-10-CM | POA: Diagnosis not present

## 2014-07-04 DIAGNOSIS — M7650 Patellar tendinitis, unspecified knee: Secondary | ICD-10-CM | POA: Diagnosis not present

## 2014-07-04 DIAGNOSIS — M542 Cervicalgia: Secondary | ICD-10-CM | POA: Diagnosis not present

## 2014-07-04 DIAGNOSIS — M549 Dorsalgia, unspecified: Secondary | ICD-10-CM | POA: Insufficient documentation

## 2014-07-04 DIAGNOSIS — M7651 Patellar tendinitis, right knee: Secondary | ICD-10-CM

## 2014-07-04 DIAGNOSIS — G8929 Other chronic pain: Secondary | ICD-10-CM | POA: Diagnosis not present

## 2014-07-04 DIAGNOSIS — Z8659 Personal history of other mental and behavioral disorders: Secondary | ICD-10-CM | POA: Insufficient documentation

## 2014-07-04 MED ORDER — PREDNISONE 50 MG PO TABS: 50.0000 mg | ORAL_TABLET | Freq: Every day | ORAL | Status: DC

## 2014-07-04 MED ORDER — KETOROLAC TROMETHAMINE 60 MG/2ML IM SOLN
60.0000 mg | Freq: Once | INTRAMUSCULAR | Status: AC
  Administered 2014-07-04: 60 mg via INTRAMUSCULAR
  Filled 2014-07-04: qty 2

## 2014-07-04 MED ORDER — CYCLOBENZAPRINE HCL 10 MG PO TABS: 10.0000 mg | ORAL_TABLET | Freq: Three times a day (TID) | ORAL | Status: DC | PRN

## 2014-07-04 MED ORDER — OXYCODONE-ACETAMINOPHEN 5-325 MG PO TABS
1.0000 | ORAL_TABLET | Freq: Once | ORAL | Status: AC
  Administered 2014-07-04: 1 via ORAL
  Filled 2014-07-04: qty 1

## 2014-07-04 MED ORDER — OXYCODONE-ACETAMINOPHEN 5-325 MG PO TABS: 1.0000 | ORAL_TABLET | Freq: Four times a day (QID) | ORAL | Status: DC | PRN

## 2014-07-04 NOTE — ED Notes (Signed)
Declined W/C at D/C and was escorted to lobby by RN. 

## 2014-07-04 NOTE — ED Notes (Signed)
Pt here c/o chronic left sided neck and upper back pain that is worse over last couple of days; pt c/o right knee pain from recently start to "jog"

## 2014-07-04 NOTE — ED Notes (Signed)
Pt states " I just want something to knock me out."

## 2014-07-04 NOTE — Discharge Instructions (Signed)
Return here as needed.  Followup with Dr. provided and your primary care doctor.  Use ice and heat on your neck and knee

## 2014-07-04 NOTE — ED Provider Notes (Signed)
CSN: [id number]     Arrival date & time 07/04/14  1500 History  This chart was scribed for Dalia Heading, PA-C working with  Merryl Hacker, MD by Randa Evens, ED Scribe. This patient was seen in room TR04C/TR04C and the patient's care was started at 5:08 PM.    Chief Complaint  Patient presents with  . Neck Pain  . Back Pain  . Knee Pain   HPI HPI Comments: Gary [name] is a 43 y.o. male who presents to the Emergency Department complaining of knee pain onset 2 months prior. Recently worsened over the past few days.He states that his knee pain might be due to the way he walks.   Chronic neck pain onset years prior. States that recently got worse over the past couple of days. He states that he was in the pain clinic but recently broke up with his wife and has no ride to make it there anymore. He denies any other related symptoms.  Patient denies chest pain, shortness of breath, nausea, vomiting, weakness, dizziness, headache, blurred vision, numbness, back pain, dysuria, incontinence, or syncope.  Past Medical History  Diagnosis Date  . Immune deficiency disorder   . Bipolar disorder   . Hypertension   . HIV (human immunodeficiency virus infection)   . HIV (human immunodeficiency virus infection)    Past Surgical History  Procedure Laterality Date  . Cervical fusion    . Amputation  11/04/2011    Procedure: AMPUTATION DIGIT;  Surgeon: Meredith Pel;  Location: [place];  Service: Orthopedics;  Laterality: Right;  Right Second Toe Distal Phalanx Amputation  . Bunionectomy     History reviewed. No pertinent family history. History  Substance Use Topics  . Smoking status: Current Every Day Smoker -- 0.40 packs/day for 15 years  . Smokeless tobacco: Never Used     Comment: cutting back, not ready to quit completely  . Alcohol Use: No     Comment: 2-3 times a month    Review of Systems  Musculoskeletal: Positive for arthralgias, back pain and neck pain.   A  complete 10 system review of systems was obtained and all systems are negative except as noted in the HPI and PMH.    Allergies  Tivicay; Peach; Nsaids; and Ultram  Home Medications   Prior to Admission medications   Medication Sig Start Date End Date Taking? Authorizing Provider  acetaminophen (TYLENOL) 500 MG tablet Take 1,000 mg by mouth 3 (three) times daily as needed for pain. For pain   Yes Historical Provider, MD  atazanavir-cobicistat (EVOTAZ) 300-150 MG per tablet Take 1 tablet by mouth daily. Swallow whole. Do NOT crush, cut or chew tablet. Take with food. 07/01/14  Yes Campbell Riches, MD  pregabalin (LYRICA) 25 MG capsule Take 25 mg by mouth 3 (three) times daily.    Yes Historical Provider, MD   Triage Vitals: BP 125/80  Pulse 80  Temp(Src) 98.1 F (36.7 C) (Oral)  Resp 18  SpO2 99%  Physical Exam  Nursing note and vitals reviewed. Constitutional: He is oriented to person, place, and time. He appears well-developed and well-nourished. No distress.  HENT:  Head: Normocephalic and atraumatic.  Neck: Neck supple. No tracheal deviation present.  Pulmonary/Chest: Effort normal. No respiratory distress.  Musculoskeletal: Normal range of motion.       Cervical back: He exhibits tenderness and pain. He exhibits normal range of motion, no bony tenderness, no swelling, no deformity, no laceration and no spasm.  Legs: Neurological: He is alert and oriented to person, place, and time. He has normal reflexes. He exhibits normal muscle tone. Coordination normal.  Skin: Skin is warm and dry.  Psychiatric: He has a normal mood and affect. His behavior is normal.    ED Course  Procedures (including critical care time) DIAGNOSTIC STUDIES: Oxygen Saturation is 99% on RA, normal by my interpretation.    COORDINATION OF CARE:  Patient will be referred back to his pain clinic.  Told to return here as needed.  Does not have any neurological deficits noted on exam.  Patient is  advised to use ice, knee of his neck.  The patient has tenderness over the patellar tendon of his knee and I feel like he has patellar tendinitis.   Brent General, PA-C 07/04/14 1720

## 2014-07-05 NOTE — ED Provider Notes (Signed)
Medical screening examination/treatment/procedure(s) were performed by non-physician practitioner and as supervising physician I was immediately available for consultation/collaboration.   EKG Interpretation None        Merryl Hacker, MD 07/05/14 [phone]

## 2014-07-11 ENCOUNTER — Telehealth: Payer: Self-pay | Admitting: *Deleted

## 2014-07-11 NOTE — Telephone Encounter (Signed)
Record Release sent from [facility]. Last office note, med list and lab results faxed to [phone]. Myrtis Hopping

## 2014-08-12 ENCOUNTER — Emergency Department (HOSPITAL_COMMUNITY): Payer: Medicare Other

## 2014-08-12 ENCOUNTER — Emergency Department (HOSPITAL_COMMUNITY)
Admission: EM | Admit: 2014-08-12 | Discharge: 2014-08-13 | Disposition: A | Discharge status: Home / Self Care | Payer: Medicare Other | Attending: Emergency Medicine | Admitting: Emergency Medicine

## 2014-08-12 ENCOUNTER — Encounter (HOSPITAL_COMMUNITY): Payer: Self-pay | Admitting: Emergency Medicine

## 2014-08-12 DIAGNOSIS — S0990XA Unspecified injury of head, initial encounter: Secondary | ICD-10-CM | POA: Diagnosis not present

## 2014-08-12 DIAGNOSIS — S199XXA Unspecified injury of neck, initial encounter: Secondary | ICD-10-CM | POA: Diagnosis not present

## 2014-08-12 DIAGNOSIS — R059 Cough, unspecified: Secondary | ICD-10-CM | POA: Diagnosis present

## 2014-08-12 DIAGNOSIS — R05 Cough: Secondary | ICD-10-CM | POA: Diagnosis present

## 2014-08-12 DIAGNOSIS — Z79899 Other long term (current) drug therapy: Secondary | ICD-10-CM | POA: Diagnosis not present

## 2014-08-12 DIAGNOSIS — F319 Bipolar disorder, unspecified: Secondary | ICD-10-CM | POA: Insufficient documentation

## 2014-08-12 DIAGNOSIS — IMO0002 Reserved for concepts with insufficient information to code with codable children: Secondary | ICD-10-CM | POA: Insufficient documentation

## 2014-08-12 DIAGNOSIS — F17200 Nicotine dependence, unspecified, uncomplicated: Secondary | ICD-10-CM | POA: Diagnosis not present

## 2014-08-12 DIAGNOSIS — Z981 Arthrodesis status: Secondary | ICD-10-CM | POA: Insufficient documentation

## 2014-08-12 DIAGNOSIS — Z21 Asymptomatic human immunodeficiency virus [HIV] infection status: Secondary | ICD-10-CM | POA: Diagnosis not present

## 2014-08-12 DIAGNOSIS — I10 Essential (primary) hypertension: Secondary | ICD-10-CM | POA: Insufficient documentation

## 2014-08-12 DIAGNOSIS — J209 Acute bronchitis, unspecified: Secondary | ICD-10-CM | POA: Diagnosis not present

## 2014-08-12 DIAGNOSIS — J40 Bronchitis, not specified as acute or chronic: Secondary | ICD-10-CM

## 2014-08-12 DIAGNOSIS — S0993XA Unspecified injury of face, initial encounter: Secondary | ICD-10-CM | POA: Insufficient documentation

## 2014-08-12 LAB — CBC WITH DIFFERENTIAL/PLATELET
BASOS PCT: 1 % (ref 0–1)
Basophils Absolute: 0.1 10*3/uL (ref 0.0–0.1)
Eosinophils Absolute: 0.1 10*3/uL (ref 0.0–0.7)
Eosinophils Relative: 1 % (ref 0–5)
HEMATOCRIT: 40.7 % (ref 39.0–52.0)
Hemoglobin: 13.1 g/dL (ref 13.0–17.0)
LYMPHS PCT: 46 % (ref 12–46)
Lymphs Abs: 2.8 10*3/uL (ref 0.7–4.0)
MCH: 23 pg — ABNORMAL LOW (ref 26.0–34.0)
MCHC: 32.2 g/dL (ref 30.0–36.0)
MCV: 71.5 fL — ABNORMAL LOW (ref 78.0–100.0)
MONO ABS: 0.4 10*3/uL (ref 0.1–1.0)
Monocytes Relative: 7 % (ref 3–12)
NEUTROS PCT: 45 % (ref 43–77)
Neutro Abs: 2.7 10*3/uL (ref 1.7–7.7)
Platelets: 259 10*3/uL (ref 150–400)
RBC: 5.69 MIL/uL (ref 4.22–5.81)
RDW: 13.4 % (ref 11.5–15.5)
WBC: 6 10*3/uL (ref 4.0–10.5)

## 2014-08-12 LAB — BASIC METABOLIC PANEL
Anion gap: 15 (ref 5–15)
BUN: 9 mg/dL (ref 6–23)
CO2: 23 mEq/L (ref 19–32)
CREATININE: 0.97 mg/dL (ref 0.50–1.35)
Calcium: 9.5 mg/dL (ref 8.4–10.5)
Chloride: 103 mEq/L (ref 96–112)
GFR calc non Af Amer: >90 mL/min (ref >90)
Glucose, Bld: 102 mg/dL — ABNORMAL HIGH (ref 70–99)
Potassium: 4.3 mEq/L (ref 3.7–5.3)
SODIUM: 141 meq/L (ref 137–147)

## 2014-08-12 MED ORDER — OXYCODONE-ACETAMINOPHEN 5-325 MG PO TABS
1.0000 | ORAL_TABLET | Freq: Once | ORAL | Status: AC
  Administered 2014-08-12: 1 via ORAL
  Filled 2014-08-12: qty 1

## 2014-08-12 MED ORDER — OXYCODONE-ACETAMINOPHEN 5-325 MG PO TABS: 2.0000 | ORAL_TABLET | ORAL | Status: DC | PRN

## 2014-08-12 MED ORDER — OXYCODONE-ACETAMINOPHEN 5-325 MG PO TABS
2.0000 | ORAL_TABLET | Freq: Once | ORAL | Status: AC
  Administered 2014-08-12: 2 via ORAL
  Filled 2014-08-12: qty 2

## 2014-08-12 MED ORDER — DOXYCYCLINE HYCLATE 100 MG PO CAPS: 100.0000 mg | ORAL_CAPSULE | Freq: Two times a day (BID) | ORAL | Status: DC

## 2014-08-12 NOTE — ED Notes (Signed)
Apologized to pt for wait time. Pt reports continued 9/10 pain. Pt in NAD. Pain protocol initiated.

## 2014-08-12 NOTE — Discharge Instructions (Signed)
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
## 2014-09-18 ENCOUNTER — Other Ambulatory Visit: Payer: Medicare Other

## 2014-10-02 ENCOUNTER — Ambulatory Visit: Payer: Medicare Other | Admitting: Infectious Diseases

## 2014-10-16 ENCOUNTER — Other Ambulatory Visit: Payer: Medicare Other

## 2014-11-07 ENCOUNTER — Telehealth: Payer: Self-pay | Admitting: *Deleted

## 2014-11-07 NOTE — Telephone Encounter (Signed)
Patient called c/o numbness in both arms and legs x 4 months. He wanted to be given a work in appointment for tomorrow. Advised patient to be seen by PCP, Dr. Benna Dunks. He said he uses Urgent Care for his primary care and he will go there tomorrow. He has had 3 no show in the past year and he was given a lab appointment for 11/13/14. If he shows for that the front desk will schedule a follow up appointment.

## 2014-11-12 ENCOUNTER — Other Ambulatory Visit: Payer: Medicare Other

## 2014-11-12 DIAGNOSIS — B20 Human immunodeficiency virus [HIV] disease: Secondary | ICD-10-CM

## 2014-11-12 LAB — CBC
HEMATOCRIT: 44.3 % (ref 39.0–52.0)
HEMOGLOBIN: 14.1 g/dL (ref 13.0–17.0)
MCH: 23.2 pg — AB (ref 26.0–34.0)
MCHC: 31.8 g/dL (ref 30.0–36.0)
MCV: 72.9 fL — ABNORMAL LOW (ref 78.0–100.0)
MPV: 10.8 fL (ref 9.4–12.4)
Platelets: 259 10*3/uL (ref 150–400)
RBC: 6.08 MIL/uL — ABNORMAL HIGH (ref 4.22–5.81)
RDW: 15.3 % (ref 11.5–15.5)
WBC: 4.8 10*3/uL (ref 4.0–10.5)

## 2014-11-12 LAB — COMPLETE METABOLIC PANEL WITH GFR
ALK PHOS: 94 U/L (ref 39–117)
ALT: 25 U/L (ref 0–53)
AST: 21 U/L (ref 0–37)
Albumin: 4.3 g/dL (ref 3.5–5.2)
BILIRUBIN TOTAL: 0.9 mg/dL (ref 0.2–1.2)
BUN: 15 mg/dL (ref 6–23)
CO2: 28 meq/L (ref 19–32)
Calcium: 9.3 mg/dL (ref 8.4–10.5)
Chloride: 102 mEq/L (ref 96–112)
Creat: 1.22 mg/dL (ref 0.50–1.35)
GFR, EST AFRICAN AMERICAN: 85 mL/min
GFR, EST NON AFRICAN AMERICAN: 73 mL/min
GLUCOSE: 83 mg/dL (ref 70–99)
Potassium: 4.5 mEq/L (ref 3.5–5.3)
Sodium: 137 mEq/L (ref 135–145)
Total Protein: 6.8 g/dL (ref 6.0–8.3)

## 2014-11-13 ENCOUNTER — Other Ambulatory Visit: Payer: Medicare Other

## 2014-11-13 LAB — T-HELPER CELL (CD4) - (RCID CLINIC ONLY)
CD4 % Helper T Cell: 29 % — ABNORMAL LOW (ref 33–55)
CD4 T CELL ABS: 680 /uL (ref 400–2700)

## 2014-11-15 LAB — HIV-1 RNA ULTRAQUANT REFLEX TO GENTYP+
HIV 1 RNA Quant: <20 copies/mL (ref <20)
HIV-1 RNA Quant, Log: <1.3 {Log} (ref <1.30)

## 2014-12-10 ENCOUNTER — Ambulatory Visit (INDEPENDENT_AMBULATORY_CARE_PROVIDER_SITE_OTHER): Payer: Medicare Other | Admitting: *Deleted

## 2014-12-10 ENCOUNTER — Encounter: Payer: Self-pay | Admitting: Infectious Diseases

## 2014-12-10 ENCOUNTER — Ambulatory Visit (INDEPENDENT_AMBULATORY_CARE_PROVIDER_SITE_OTHER): Payer: Medicare Other | Admitting: Infectious Diseases

## 2014-12-10 VITALS — BP 134/82 | HR 71 | Temp 98.5°F | Wt 161.0 lb

## 2014-12-10 DIAGNOSIS — B20 Human immunodeficiency virus [HIV] disease: Secondary | ICD-10-CM

## 2014-12-10 DIAGNOSIS — Z23 Encounter for immunization: Secondary | ICD-10-CM

## 2014-12-10 DIAGNOSIS — Z113 Encounter for screening for infections with a predominantly sexual mode of transmission: Secondary | ICD-10-CM

## 2014-12-10 DIAGNOSIS — Z79899 Other long term (current) drug therapy: Secondary | ICD-10-CM

## 2014-12-10 DIAGNOSIS — I10 Essential (primary) hypertension: Secondary | ICD-10-CM

## 2014-12-10 DIAGNOSIS — M5030 Other cervical disc degeneration, unspecified cervical region: Secondary | ICD-10-CM

## 2014-12-10 MED ORDER — EMTRICITABINE-TENOFOVIR DF 200-300 MG PO TABS: 1.0000 | ORAL_TABLET | Freq: Every day | ORAL | Status: DC

## 2014-12-10 NOTE — Assessment & Plan Note (Signed)
Was in hospital 2.5 months ago with PNA of ? Type. Improved.  He has been on evotaz alone, will restart his trv.  His wife is +, he is not offered condoms.  He is Hep B immune Will see him back in 6 months.

## 2014-12-10 NOTE — Assessment & Plan Note (Signed)
Well conrtolled today.

## 2014-12-10 NOTE — Progress Notes (Signed)
   Subjective:    Patient ID: Gary [name], male    DOB: 10-06-1971, 43 y.o.   MRN: [id number]  HPI 43 yo M with hx of HIV+, tobacco use. Has been in pain due to pinched nerve in his neck, believes he may have neuropathy. Has appt with his PCP 2 weeks ago and he was reffered to spine/scoliosis specialist in [place]. Burning, hyperesthesia on L side of body and both LE and lower part of both UE.   Has been taking his ART since last visit. Has missed 3/4 doses. Due to missed refills, running out.   HIV 1 RNA QUANT (copies/mL)  Date Value  11/12/2014 <20  02/28/2014 31*  10/30/2013 668*   CD4 T CELL ABS  Date Value  11/12/2014 680 /uL  02/28/2014 670 /uL  07/03/2013 600 cmm   Review of Systems  Constitutional: Negative for appetite change and unexpected weight change.  Gastrointestinal: Negative for diarrhea and constipation.  Genitourinary: Negative for difficulty urinating.  Musculoskeletal: Positive for back pain and neck pain.      Objective:   Physical Exam  Constitutional: He appears well-developed and well-nourished.  HENT:  Mouth/Throat: No oropharyngeal exudate.  Eyes: EOM are normal. Pupils are equal, round, and reactive to light.  Neck: Neck supple.  Cardiovascular: Normal rate, regular rhythm and normal heart sounds.   Pulmonary/Chest: Effort normal and breath sounds normal.  Abdominal: Soft. Bowel sounds are normal. There is no tenderness.  Lymphadenopathy:    He has no cervical adenopathy.          Assessment & Plan:

## 2014-12-10 NOTE — Assessment & Plan Note (Signed)
He will f/u with spine specialist, if no help on to neuro.

## 2015-02-05 ENCOUNTER — Ambulatory Visit: Payer: Medicare Other | Admitting: Neurology

## 2015-02-05 ENCOUNTER — Telehealth: Payer: Self-pay | Admitting: Neurology

## 2015-02-05 NOTE — Telephone Encounter (Signed)
This patient did not show for a new patient appointment today. 

## 2015-02-17 ENCOUNTER — Ambulatory Visit: Payer: Medicare Other | Admitting: Neurology

## 2015-02-17 ENCOUNTER — Telehealth: Payer: Self-pay | Admitting: Neurology

## 2015-02-17 NOTE — Telephone Encounter (Signed)
The patient has no showed on 2 occasions for new patient visit. According to protocol, the patient will be discharged from our practice.

## 2015-02-17 NOTE — Telephone Encounter (Signed)
This was second no show for new patient appointment.  Patient relayed he did not have gas money.  Angie in billing wanted to know if doctor still wanted to dismiss.

## 2015-02-17 NOTE — Telephone Encounter (Signed)
This patient does not show for a new patient appointment today.

## 2015-02-19 ENCOUNTER — Encounter: Payer: Self-pay | Admitting: Neurology

## 2015-03-06 ENCOUNTER — Other Ambulatory Visit: Payer: Self-pay | Admitting: *Deleted

## 2015-03-06 DIAGNOSIS — B20 Human immunodeficiency virus [HIV] disease: Secondary | ICD-10-CM

## 2015-03-06 MED ORDER — ATAZANAVIR-COBICISTAT 300-150 MG PO TABS
1.0000 | ORAL_TABLET | Freq: Every day | ORAL | Status: DC
Start: 2015-03-06 — End: 2016-03-03

## 2015-05-28 ENCOUNTER — Other Ambulatory Visit: Payer: Medicare Other

## 2015-06-11 ENCOUNTER — Ambulatory Visit: Payer: Medicare Other | Admitting: Infectious Diseases

## 2015-11-03 ENCOUNTER — Encounter (HOSPITAL_BASED_OUTPATIENT_CLINIC_OR_DEPARTMENT_OTHER): Payer: Self-pay

## 2015-11-03 ENCOUNTER — Emergency Department (HOSPITAL_BASED_OUTPATIENT_CLINIC_OR_DEPARTMENT_OTHER)
Admission: EM | Admit: 2015-11-03 | Discharge: 2015-11-03 | Discharge status: Against Medical Advice | Payer: Medicare Other | Attending: Dermatology | Admitting: Dermatology

## 2015-11-03 DIAGNOSIS — I10 Essential (primary) hypertension: Secondary | ICD-10-CM | POA: Insufficient documentation

## 2015-11-03 DIAGNOSIS — F17210 Nicotine dependence, cigarettes, uncomplicated: Secondary | ICD-10-CM | POA: Diagnosis not present

## 2015-11-03 DIAGNOSIS — Z21 Asymptomatic human immunodeficiency virus [HIV] infection status: Secondary | ICD-10-CM | POA: Diagnosis not present

## 2015-11-03 DIAGNOSIS — M545 Low back pain: Secondary | ICD-10-CM | POA: Insufficient documentation

## 2015-11-03 NOTE — ED Notes (Signed)
Pt verbally upset because [facility] unable to provide patient with lyrica and Zanaflex. Informed patient that we have no control over what [facility] allows and doesn't allow. Pt verbally upset at that time and stated that being here was a waste of his time if they were not going to be able to provide his medications. Again I informed him that we cannot control [facility] policy and procedures as far as medication guidelines are concerned. Pt stated he was not there for pills, he was there for marijuana and alcohol. Pt decided he was going to leave [facility] and find another ride home. Micheal with [facility] notified of patients behavior and request to leave facility
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
## 2015-11-03 NOTE — ED Notes (Signed)
Pt sitting out front at registration desk. Approached pt with security, at first patient stated during our last conversation he was under the impression that I was calling [facility] to take him back to facility. At that point, I asked why he told registration that he was no longer with [facility] and requested to use the phone. At that time, pt stated that he called his wife. I let patient know that I had called [facility] and informed them that patient left [facility] and was requesting not to come back to their facility, since he had already told other staff members that he was no longer a resident of the facility. Pt stated that his wife was coming to pick him up at this time.
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
## 2016-01-02 ENCOUNTER — Other Ambulatory Visit: Payer: Self-pay | Admitting: Infectious Diseases

## 2016-01-20 ENCOUNTER — Other Ambulatory Visit: Payer: Medicare Other

## 2016-01-20 DIAGNOSIS — B20 Human immunodeficiency virus [HIV] disease: Secondary | ICD-10-CM

## 2016-01-20 DIAGNOSIS — Z79899 Other long term (current) drug therapy: Secondary | ICD-10-CM

## 2016-01-20 DIAGNOSIS — Z113 Encounter for screening for infections with a predominantly sexual mode of transmission: Secondary | ICD-10-CM

## 2016-01-20 LAB — CBC WITH DIFFERENTIAL/PLATELET
Basophils Absolute: 0 10*3/uL (ref 0.0–0.1)
Basophils Relative: 0 % (ref 0–1)
EOS PCT: 4 % (ref 0–5)
Eosinophils Absolute: 0.2 10*3/uL (ref 0.0–0.7)
HCT: 42.3 % (ref 39.0–52.0)
Hemoglobin: 13.2 g/dL (ref 13.0–17.0)
LYMPHS ABS: 2.1 10*3/uL (ref 0.7–4.0)
Lymphocytes Relative: 41 % (ref 12–46)
MCH: 23.7 pg — ABNORMAL LOW (ref 26.0–34.0)
MCHC: 31.2 g/dL (ref 30.0–36.0)
MCV: 75.9 fL — AB (ref 78.0–100.0)
MPV: 10.7 fL (ref 8.6–12.4)
Monocytes Absolute: 0.4 10*3/uL (ref 0.1–1.0)
Monocytes Relative: 8 % (ref 3–12)
Neutro Abs: 2.4 10*3/uL (ref 1.7–7.7)
Neutrophils Relative %: 47 % (ref 43–77)
Platelets: 243 10*3/uL (ref 150–400)
RBC: 5.57 MIL/uL (ref 4.22–5.81)
RDW: 16 % — ABNORMAL HIGH (ref 11.5–15.5)
WBC: 5 10*3/uL (ref 4.0–10.5)

## 2016-01-20 LAB — COMPLETE METABOLIC PANEL WITH GFR
ALT: 62 U/L — ABNORMAL HIGH (ref 9–46)
AST: 34 U/L (ref 10–40)
Albumin: 4 g/dL (ref 3.6–5.1)
Alkaline Phosphatase: 100 U/L (ref 40–115)
BUN: 12 mg/dL (ref 7–25)
CHLORIDE: 104 mmol/L (ref 98–110)
CO2: 28 mmol/L (ref 20–31)
Calcium: 9.7 mg/dL (ref 8.6–10.3)
Creat: 1.07 mg/dL (ref 0.60–1.35)
GFR, Est Non African American: 85 mL/min (ref >=60)
Glucose, Bld: 92 mg/dL (ref 65–99)
POTASSIUM: 4.8 mmol/L (ref 3.5–5.3)
Sodium: 139 mmol/L (ref 135–146)
Total Bilirubin: 1.8 mg/dL — ABNORMAL HIGH (ref 0.2–1.2)
Total Protein: 7.4 g/dL (ref 6.1–8.1)

## 2016-01-20 LAB — LIPID PANEL
Cholesterol: 138 mg/dL (ref 125–200)
HDL: 44 mg/dL (ref >=40)
LDL CALC: 80 mg/dL (ref <130)
Total CHOL/HDL Ratio: 3.1 Ratio (ref <=5.0)
Triglycerides: 71 mg/dL (ref <150)
VLDL: 14 mg/dL (ref <30)

## 2016-01-21 LAB — RPR

## 2016-01-21 LAB — HIV-1 RNA ULTRAQUANT REFLEX TO GENTYP+
HIV 1 RNA Quant: <20 copies/mL (ref <20)
HIV-1 RNA Quant, Log: <1.3 Log copies/mL (ref <1.30)

## 2016-01-22 LAB — T-HELPER CELL (CD4) - (RCID CLINIC ONLY)
CD4 % Helper T Cell: 37 % (ref 33–55)
CD4 T Cell Abs: 760 /uL (ref 400–2700)

## 2016-02-03 ENCOUNTER — Ambulatory Visit: Payer: Medicare Other | Admitting: Infectious Diseases

## 2016-03-03 ENCOUNTER — Ambulatory Visit (INDEPENDENT_AMBULATORY_CARE_PROVIDER_SITE_OTHER): Payer: Medicare Other | Admitting: Infectious Diseases

## 2016-03-03 ENCOUNTER — Telehealth: Payer: Self-pay

## 2016-03-03 ENCOUNTER — Encounter: Payer: Self-pay | Admitting: Infectious Diseases

## 2016-03-03 VITALS — BP 141/88 | HR 57 | Temp 97.8°F | Wt 148.0 lb

## 2016-03-03 DIAGNOSIS — Z720 Tobacco use: Secondary | ICD-10-CM

## 2016-03-03 DIAGNOSIS — F17200 Nicotine dependence, unspecified, uncomplicated: Secondary | ICD-10-CM

## 2016-03-03 DIAGNOSIS — C3412 Malignant neoplasm of upper lobe, left bronchus or lung: Secondary | ICD-10-CM | POA: Diagnosis not present

## 2016-03-03 DIAGNOSIS — R590 Localized enlarged lymph nodes: Secondary | ICD-10-CM

## 2016-03-03 DIAGNOSIS — Z23 Encounter for immunization: Secondary | ICD-10-CM | POA: Diagnosis not present

## 2016-03-03 DIAGNOSIS — R599 Enlarged lymph nodes, unspecified: Secondary | ICD-10-CM | POA: Diagnosis not present

## 2016-03-03 DIAGNOSIS — B20 Human immunodeficiency virus [HIV] disease: Secondary | ICD-10-CM

## 2016-03-03 MED ORDER — ATAZANAVIR-COBICISTAT 300-150 MG PO TABS: 1.0000 | ORAL_TABLET | Freq: Every day | ORAL | Status: DC

## 2016-03-03 MED ORDER — EMTRICITABINE-TENOFOVIR AF 200-25 MG PO TABS: 1.0000 | ORAL_TABLET | Freq: Every day | ORAL | Status: DC

## 2016-03-03 NOTE — Assessment & Plan Note (Signed)
He is doing well.  Will change him to descovy. Continue evotaz.  Given condoms Given flu shot Wife has f/u at [facility].  Will see him back in 6 months.

## 2016-03-03 NOTE — Assessment & Plan Note (Signed)
Not clear if this is a calcified cyst or calfied, fixed LN (which could be assoc with lung cancer). He has a hx of smoking.  Will check CT chest.

## 2016-03-03 NOTE — Telephone Encounter (Signed)
Called pt to notify him of CT appointment at [facility] on March 22nd at 0900. Instructed pt that he could not have solid foods 4 hours prior to procedure and that liquids and medication was fine. Instucted him to show up by 0840 to register. Also, instructed him that once he picked up his Descovy medication that he was to stop with the Truvada. Pt repeated back and understood all instructions given.

## 2016-03-03 NOTE — Progress Notes (Signed)
   Subjective:    Patient ID: Gary [name], male    DOB: [birth year], 45 y.o.   MRN: [id number]  HPI 45 yo M with hx of HIV+, tobacco use. Has been in pain due to pinched nerve in his neck, believes he may have neuropathy. Has appt with his PCP 2 weeks ago and he was reffered to spine/scoliosis specialist in [place]. Burning,  Has missed some of his TRV.  Has been feeling well. occas aches.  He has not had further f/u with spine specialist. Does not want surgery.   HIV 1 RNA QUANT (copies/mL)  Date Value  01/20/2016 <20  11/12/2014 <20  02/28/2014 31*   CD4 T CELL ABS (/uL)  Date Value  01/20/2016 760  11/12/2014 680  02/28/2014 670   Review of Systems  Constitutional: Negative for appetite change and unexpected weight change.  Gastrointestinal: Negative for diarrhea and constipation.  Genitourinary: Negative for difficulty urinating.  continued pain in his lower back, bilateral. Has numbness, tingling in legs and feet. lyrica helps little, needs reflex for xanaflex.  wife goes to [place] [facility]     Objective:   Physical Exam  Constitutional: He appears well-developed and well-nourished.  HENT:  Mouth/Throat: No oropharyngeal exudate.  Eyes: EOM are normal. Pupils are equal, round, and reactive to light.  Neck: Neck supple.  Cardiovascular: Normal rate, regular rhythm and normal heart sounds.   Pulmonary/Chest: Effort normal and breath sounds normal.  Abdominal: Soft. Bowel sounds are normal. There is no tenderness. There is no rebound.  Lymphadenopathy:    He has no cervical adenopathy.       Left: Supraclavicular adenopathy present.       Assessment & Plan:

## 2016-03-10 ENCOUNTER — Inpatient Hospital Stay: Admission: RE | Admit: 2016-03-10 | Payer: Medicare Other | Source: Ambulatory Visit

## 2016-03-12 ENCOUNTER — Telehealth: Payer: Self-pay | Admitting: *Deleted

## 2016-03-12 NOTE — Telephone Encounter (Signed)
He can have zofran '4mg'$  1 po bid prn, #6 if he is not allergic.  i see no record of tiznidaine

## 2016-03-12 NOTE — Telephone Encounter (Signed)
Patient sick (nausea, vomiting, diarrhea, fever for 2 days).  He feels he has the flu.  He is asking for a prescription for nausea.   He would also like to know if the tizanidine that was discussed at his last visit can be called in. Please advise. Landis Gandy, RN

## 2016-03-15 ENCOUNTER — Inpatient Hospital Stay: Admission: RE | Admit: 2016-03-15 | Payer: Medicare Other | Source: Ambulatory Visit

## 2016-03-15 NOTE — Telephone Encounter (Signed)
Left message to see how patient was feeling today.

## 2016-03-23 ENCOUNTER — Inpatient Hospital Stay: Admission: RE | Admit: 2016-03-23 | Payer: Medicare Other | Source: Ambulatory Visit

## 2016-04-02 ENCOUNTER — Other Ambulatory Visit: Payer: Medicare Other

## 2016-04-05 ENCOUNTER — Ambulatory Visit
Admission: RE | Admit: 2016-04-05 | Discharge: 2016-04-05 | Disposition: A | Discharge status: Home / Self Care | Payer: Medicare Other | Source: Ambulatory Visit | Attending: Infectious Diseases | Admitting: Infectious Diseases

## 2016-04-05 DIAGNOSIS — B20 Human immunodeficiency virus [HIV] disease: Secondary | ICD-10-CM

## 2016-04-05 DIAGNOSIS — C3412 Malignant neoplasm of upper lobe, left bronchus or lung: Secondary | ICD-10-CM

## 2016-04-05 DIAGNOSIS — R590 Localized enlarged lymph nodes: Secondary | ICD-10-CM

## 2016-04-05 DIAGNOSIS — F17200 Nicotine dependence, unspecified, uncomplicated: Secondary | ICD-10-CM

## 2016-04-05 MED ORDER — IOPAMIDOL (ISOVUE-300) INJECTION 61%
75.0000 mL | Freq: Once | INTRAVENOUS | Status: AC | PRN
  Administered 2016-04-05: 75 mL via INTRAVENOUS

## 2016-05-12 ENCOUNTER — Telehealth: Payer: Self-pay | Admitting: *Deleted

## 2016-05-12 NOTE — Telephone Encounter (Signed)
Dr Johnnye Sima requesting patient to make appointment, follow-up on radiology results ASAP.  Message left.

## 2016-05-12 NOTE — Telephone Encounter (Signed)
-----   Message from Campbell Riches, MD sent at 05/11/2016  4:53 PM EDT ----- Pt needs f/u appt  ----- Message -----    From: Rad Results In Interface    Sent: 04/05/2016   3:43 PM      To: Campbell Riches, MD

## 2016-05-29 DIAGNOSIS — F1210 Cannabis abuse, uncomplicated: Secondary | ICD-10-CM | POA: Insufficient documentation

## 2016-05-29 DIAGNOSIS — F1420 Cocaine dependence, uncomplicated: Secondary | ICD-10-CM | POA: Insufficient documentation

## 2016-05-29 DIAGNOSIS — F1090 Alcohol use, unspecified, uncomplicated: Secondary | ICD-10-CM | POA: Insufficient documentation

## 2016-06-07 ENCOUNTER — Encounter: Payer: Self-pay | Admitting: Infectious Diseases

## 2016-06-07 ENCOUNTER — Ambulatory Visit (INDEPENDENT_AMBULATORY_CARE_PROVIDER_SITE_OTHER): Payer: Medicare Other | Admitting: Infectious Diseases

## 2016-06-07 VITALS — BP 116/81 | HR 62 | Temp 97.9°F | Wt 151.0 lb

## 2016-06-07 DIAGNOSIS — R599 Enlarged lymph nodes, unspecified: Secondary | ICD-10-CM

## 2016-06-07 DIAGNOSIS — F17200 Nicotine dependence, unspecified, uncomplicated: Secondary | ICD-10-CM

## 2016-06-07 DIAGNOSIS — M5030 Other cervical disc degeneration, unspecified cervical region: Secondary | ICD-10-CM | POA: Diagnosis not present

## 2016-06-07 DIAGNOSIS — Z720 Tobacco use: Secondary | ICD-10-CM | POA: Diagnosis not present

## 2016-06-07 DIAGNOSIS — L237 Allergic contact dermatitis due to plants, except food: Secondary | ICD-10-CM | POA: Diagnosis not present

## 2016-06-07 DIAGNOSIS — B20 Human immunodeficiency virus [HIV] disease: Secondary | ICD-10-CM | POA: Diagnosis present

## 2016-06-07 DIAGNOSIS — R590 Localized enlarged lymph nodes: Secondary | ICD-10-CM

## 2016-06-07 MED ORDER — PREDNISONE 20 MG PO TABS: 20.0000 mg | ORAL_TABLET | Freq: Every day | ORAL | Status: DC

## 2016-06-07 MED ORDER — TADALAFIL 5 MG PO TABS: 5.0000 mg | ORAL_TABLET | Freq: Every day | ORAL | Status: DC | PRN

## 2016-06-07 NOTE — Assessment & Plan Note (Signed)
Will send him to surgery

## 2016-06-07 NOTE — Assessment & Plan Note (Addendum)
Short course of steroids as he is failing topical

## 2016-06-07 NOTE — Assessment & Plan Note (Signed)
Unchanged.  CT was not concerning.

## 2016-06-07 NOTE — Assessment & Plan Note (Addendum)
He is doing well Has condoms. Will get him in with dental.  Has psych Low dose of cialis Will see him back in 6 months.  rtc 6 months

## 2016-06-07 NOTE — Assessment & Plan Note (Signed)
Encouraged to quit. 

## 2016-06-07 NOTE — Progress Notes (Signed)
   Subjective:    Patient ID: Gary [name], male    DOB: 1971/10/08, 45 y.o.   MRN: [id number]  HPI 45 yo M with hx of HIV+, tobacco use. Has been in pain due to pinched nerve in his neck, believes he may have neuropathy. HIV+ wife.  He has been on Evotaz/Descovy. He had an enlarged L [address] LN at his prev visit. He had aCT scan showing:  Upper normal to borderline enlarged lymph nodes are seen in the left thoracic inlet and both axillary regions. Imaging features are nonspecific. Has rash on B UE (believes it is poison ivy) after working in yard. Has been using HCT cream. Not helping so far.   HIV 1 RNA QUANT (copies/mL)  Date Value  01/20/2016 <20  11/12/2014 <20  02/28/2014 31*   CD4 T CELL ABS (/uL)  Date Value  01/20/2016 760  11/12/2014 680  02/28/2014 670   No problem with ART. Had 1 week where he quit taking- real depressed and frustrated- every day life situations. He has a Social worker at mental health. Has just returned to them.   Would like to see spine surgeon.  Would like dental.   Review of Systems  Constitutional: Negative for appetite change and unexpected weight change.  Gastrointestinal: Negative for diarrhea and constipation.  Genitourinary: Negative for difficulty urinating and genital sores.  Skin: Positive for rash.       Objective:   Physical Exam  Constitutional: He appears well-developed and well-nourished.  HENT:  Mouth/Throat: No oropharyngeal exudate.  Eyes: EOM are normal. Pupils are equal, round, and reactive to light.  Neck: Neck supple.  Cardiovascular: Normal rate, regular rhythm and normal heart sounds.   Pulmonary/Chest: Effort normal and breath sounds normal.  Abdominal: Soft. Bowel sounds are normal. There is no rebound.  Lymphadenopathy:    He has no cervical adenopathy.  Skin: Rash noted.  BUE papular rash. Non-infected.        Assessment & Plan:

## 2016-06-08 ENCOUNTER — Telehealth: Payer: Self-pay

## 2016-06-08 NOTE — Telephone Encounter (Signed)
Neurosurgery referral ordered by Dr. Johnnye Sima. Referral faxed to [facility]. Fax OK status returned. Candace Murray,LPN

## 2016-08-12 ENCOUNTER — Telehealth: Payer: Self-pay

## 2016-08-12 NOTE — Telephone Encounter (Signed)
Called patient to inquiry if he no longer needed the neurosurgery referral. [facility] to follow up on referral by Dr. Johnnye Sima and was told that appointment was cancelled on July 11th. Left message for patient to call back to office. Rodman Key, LPN

## 2016-08-13 ENCOUNTER — Emergency Department (HOSPITAL_BASED_OUTPATIENT_CLINIC_OR_DEPARTMENT_OTHER): Payer: Medicare Other

## 2016-08-13 ENCOUNTER — Encounter (HOSPITAL_BASED_OUTPATIENT_CLINIC_OR_DEPARTMENT_OTHER): Payer: Self-pay | Admitting: *Deleted

## 2016-08-13 ENCOUNTER — Emergency Department (HOSPITAL_BASED_OUTPATIENT_CLINIC_OR_DEPARTMENT_OTHER)
Admission: EM | Admit: 2016-08-13 | Discharge: 2016-08-13 | Disposition: A | Discharge status: Home / Self Care | Payer: Medicare Other | Attending: Emergency Medicine | Admitting: Emergency Medicine

## 2016-08-13 DIAGNOSIS — M545 Low back pain, unspecified: Secondary | ICD-10-CM

## 2016-08-13 DIAGNOSIS — F17210 Nicotine dependence, cigarettes, uncomplicated: Secondary | ICD-10-CM | POA: Insufficient documentation

## 2016-08-13 DIAGNOSIS — Z79899 Other long term (current) drug therapy: Secondary | ICD-10-CM | POA: Insufficient documentation

## 2016-08-13 DIAGNOSIS — Z21 Asymptomatic human immunodeficiency virus [HIV] infection status: Secondary | ICD-10-CM | POA: Insufficient documentation

## 2016-08-13 DIAGNOSIS — I10 Essential (primary) hypertension: Secondary | ICD-10-CM | POA: Insufficient documentation

## 2016-08-13 DIAGNOSIS — K0889 Other specified disorders of teeth and supporting structures: Secondary | ICD-10-CM | POA: Insufficient documentation

## 2016-08-13 MED ORDER — ENOXAPARIN SODIUM 80 MG/0.8ML ~~LOC~~ SOLN: 1.0000 mg/kg | Freq: Once | SUBCUTANEOUS | Status: DC

## 2016-08-13 MED ORDER — CLINDAMYCIN HCL 300 MG PO CAPS: 300.0000 mg | ORAL_CAPSULE | Freq: Four times a day (QID) | ORAL | 0 refills | Status: DC

## 2016-08-13 MED ORDER — IBUPROFEN 800 MG PO TABS
800.0000 mg | ORAL_TABLET | Freq: Once | ORAL | Status: AC
  Administered 2016-08-13: 800 mg via ORAL
  Filled 2016-08-13: qty 1

## 2016-08-13 MED ORDER — CLINDAMYCIN HCL 150 MG PO CAPS
450.0000 mg | ORAL_CAPSULE | Freq: Once | ORAL | Status: AC
  Administered 2016-08-13: 450 mg via ORAL
  Filled 2016-08-13: qty 3

## 2016-08-13 MED ORDER — IBUPROFEN 600 MG PO TABS: 600.0000 mg | ORAL_TABLET | Freq: Three times a day (TID) | ORAL | 0 refills | Status: DC | PRN

## 2016-08-13 NOTE — ED Provider Notes (Signed)
[name] Provider Note   CSN: [id number] Arrival date & time: 08/13/16  1841  By signing my name below, I, Emmanuella Mensah, attest that this documentation has been prepared under the direction and in the presence of [facility], PA-C. Electronically Signed: Judithann Sauger, ED Scribe. 08/13/16. 7:14 PM.    History   Chief Complaint Chief Complaint  Patient presents with  . Dental Pain    HPI Comments: Gary [name] is a 45 y.o. male with a hx of HIV (CD4 count > 700 per patient) and HTN who presents to the Emergency Department complaining of ongoing moderate right lower dental pain onset 3 days ago. He reports associated low grade fever (highest 99.5 PTA) and night sweats for several weeks. No alleviating factors noted. He has not tried any medications for these symptoms. He denies any trouble swallowing, SOB, sore throat, or any n/v.   He also is complaining of ongoing constant moderate bilateral lower back pain, worse on his left onset 3 years ago. He reports associated numbness to bilateral LE's which is unchanged over the last 3 years. Per chart review, he has been referred to neurosurgery for this by infectious disease physician, however has not kept appointments. He explains that he was receiving pain medication from his PCP but stopped taking the medication and started drinking ETOH for pain management. He realized this was a problem and checked himself into [facility]. Does not want any controlled substances for pain. No fevers, saddle anesthesia, weakness, urinary complaints including retention/incontinence. No history of cancer, IVDU, or recent spinal procedures.  The history is provided by the patient. No language interpreter was used.    Past Medical History:  Diagnosis Date  . Bipolar disorder ([place])   . HIV (human immunodeficiency virus infection) ([place])   . HIV (human immunodeficiency virus infection) ([place])   . Hypertension   . Immune deficiency disorder  [facility])     Patient Active Problem List   Diagnosis Date Noted  . Poison ivy dermatitis 06/07/2016  . Lymphadenopathy, supraclavicular 03/03/2016  . Smoking 10/06/2011  . Encounter for long-term (current) use of other medications 04/19/2011  . Human immunodeficiency virus (HIV) disease ([place]) 11/26/2009  . HERPES ZOSTER, HX OF 11/26/2009  . SICKLE-CELL TRAIT 09/25/2009  . BIPOLAR DISORDER UNSPECIFIED 09/25/2009  . ESSENTIAL HYPERTENSION, BENIGN 09/25/2009  . [place] DISEASE, CERVICAL 09/25/2009    Past Surgical History:  Procedure Laterality Date  . AMPUTATION  11/04/2011   Procedure: AMPUTATION DIGIT;  Surgeon: Meredith Pel;  Location: [place];  Service: Orthopedics;  Laterality: Right;  Right Second Toe Distal Phalanx Amputation  . BUNIONECTOMY    . CERVICAL FUSION         Home Medications    Prior to Admission medications   Medication Sig Start Date End Date Taking? Authorizing Provider  acetaminophen (TYLENOL) 500 MG tablet Take 1,000 mg by mouth 3 (three) times daily as needed for pain. For pain    Historical Provider, MD  atazanavir-cobicistat (EVOTAZ) 300-150 MG tablet Take 1 tablet by mouth daily. Swallow whole. Do NOT crush, cut or chew tablet. Take with food. 03/03/16   Campbell Riches, MD  clindamycin (CLEOCIN) 300 MG capsule Take 1 capsule (300 mg total) by mouth 4 (four) times daily. 08/13/16   Ozella Almond Sharise Lippy, PA-C  emtricitabine-tenofovir AF (DESCOVY) 200-25 MG tablet Take 1 tablet by mouth daily. 03/03/16   Campbell Riches, MD  ibuprofen (ADVIL,MOTRIN) 600 MG tablet Take 1 tablet (600 mg total) by mouth  every 8 (eight) hours as needed. 08/13/16   Ozella Almond Sahasra Belue, PA-C  predniSONE (DELTASONE) 20 MG tablet Take 1 tablet (20 mg total) by mouth daily with breakfast. 2 tabs po on day 1,  1 and 1/2 tab on day 2 & 3,  1 tab on days 4 & 5 1/2 tab on days 5 & 6 06/07/16   Campbell Riches, MD  pregabalin (LYRICA) 100 MG capsule Take 100 mg by mouth 2 (two) times  daily. Reported on 03/03/2016    Historical Provider, MD  tadalafil (CIALIS) 5 MG tablet Take 1 tablet (5 mg total) by mouth daily as needed for erectile dysfunction. Once every 48 h at most. Call MD if erection lasting greater than 6 hours.  Use condoms. 06/07/16   Campbell Riches, MD  tiZANidine (ZANAFLEX) 4 MG tablet Take 4 mg by mouth every 6 (six) hours as needed for muscle spasms. Reported on 03/03/2016    Historical Provider, MD    Family History No family history on file.  Social History Social History  Substance Use Topics  . Smoking status: Current Every Day Smoker    Packs/day: 0.25    Years: 15.00    Types: Cigarettes  . Smokeless tobacco: Never Used     Comment: cutting back, not ready to quit completely  . Alcohol use No     Allergies   Tivicay [dolutegravir]; Nsaids; Peach [prunus persica]; and Ultram [tramadol hcl]   Review of Systems Review of Systems  Constitutional: Negative for fever.  HENT: Positive for dental problem. Negative for sore throat and trouble swallowing.   Gastrointestinal: Negative for nausea and vomiting.  Musculoskeletal: Positive for back pain.  Allergic/Immunologic: Positive for immunocompromised state.     Physical Exam Updated Vital Signs BP 141/94   Pulse 76   Temp 98.2 F (36.8 C) (Oral)   Resp 18   Ht '5\' 8"'$  (1.727 m)   Wt 70.3 kg   SpO2 99%   BMI 23.57 kg/m   Physical Exam  Constitutional: He is oriented to person, place, and time. He appears well-developed and well-nourished. No distress.  HENT:  Head: Normocephalic and atraumatic.  Mouth/Throat:    Dental cavities and poor oral dentition noted, pain along tooth as depicted in image, midline uvula, no trismus, oropharynx moist and clear, no abscess noted, no oropharyngeal erythema or edema, neck supple and no tenderness. No facial edema  Neck: Normal range of motion. Neck supple.  No meningeal signs.  Cardiovascular: Normal rate, regular rhythm and normal heart  sounds.   Pulmonary/Chest: Effort normal and breath sounds normal. No respiratory distress.  Musculoskeletal: Normal range of motion.       Arms: 5/5 muscle strength of bilateral LE's. Sensation intact. 2+ DP bilaterally.   Neurological: He is alert and oriented to person, place, and time. He has normal reflexes.  Skin: Skin is warm and dry.  Nursing note and vitals reviewed.    ED Treatments / Results  DIAGNOSTIC STUDIES: Oxygen Saturation is 99% on RA, normal by my interpretation.    COORDINATION OF CARE: 7:14 PM- Pt advised of plan for treatment and pt agrees. Pt will receive antibiotics for his dental pain.     Labs (all labs ordered are listed, but only abnormal results are displayed) Labs Reviewed - No data to display  EKG  EKG Interpretation None       Radiology Dg Lumbar Spine Complete  Result Date: 08/13/2016 CLINICAL DATA:  Chronic low back pain for  2 years. Recent acute low back pain horizontally between the iliac crests. Numbness and tingling of both legs. Decreased bladder control for 2 months. No injury. EXAM: LUMBAR SPINE - COMPLETE 4+ VIEW COMPARISON:  05/11/2011 FINDINGS: There is no evidence of lumbar spine fracture. Alignment is normal. Intervertebral disc spaces are maintained. IMPRESSION: Negative. Electronically Signed   By: Lucienne Capers M.D.   On: 08/13/2016 19:45    Procedures Procedures (including critical care time)  Medications Ordered in ED Medications  ibuprofen (ADVIL,MOTRIN) tablet 800 mg (800 mg Oral Given 08/13/16 2020)  clindamycin (CLEOCIN) capsule 450 mg (450 mg Oral Given 08/13/16 2020)     Initial Impression / Assessment and Plan / ED Course  Pearlie Oyster, PA-C has reviewed the triage vital signs and the nursing notes.  Pertinent labs & imaging results that were available during my care of the patient were reviewed by me and considered in my medical decision making (see chart for details).  Clinical Course   Patient with  dentalgia. No abscess requiring immediate incision and drainage. Patient is afebrile, non toxic appearing, and swallowing secretions well. Exam not concerning for Ludwig's angina or pharyngeal abscess. Will treat with Clinda. I stressed the importance of dental follow up for ultimate management of dental pain. Patient voices understanding and is agreeable to plan.  Patient also presenting with low back pain x 3 years. No acute change in symptoms. No red flag symptoms. Requesting imaging because it has been a long time since he had x-rays done. L spine imaging unremarkable. Ibuprofen as needed for pain. Per chart review, patient has been referred to neurosurgery. I included neurosurgery information once again and encouraged patient to schedule a follow-up appointment and attendant. Reasons to return to ED discussed and all questions answered.   Final Clinical Impressions(s) / ED Diagnoses   Final diagnoses:  Pain, dental  Bilateral low back pain without sciatica    New Prescriptions Discharge Medication List as of 08/13/2016  8:16 PM    START taking these medications   Details  clindamycin (CLEOCIN) 300 MG capsule Take 1 capsule (300 mg total) by mouth 4 (four) times daily., Starting Fri 08/13/2016, Print    ibuprofen (ADVIL,MOTRIN) 600 MG tablet Take 1 tablet (600 mg total) by mouth every 8 (eight) hours as needed., Starting Fri 08/13/2016, Print       I personally performed the services described in this documentation, which was scribed in my presence. The recorded information has been reviewed and is accurate.    [facility] Clinton Wahlberg, PA-C 08/13/16 2212    Charlesetta Shanks, MD 08/14/16 [phone]

## 2016-08-13 NOTE — ED Triage Notes (Signed)
Dental pain x 3 days. Back pain. He is a pt of [facility].

## 2016-08-13 NOTE — Discharge Instructions (Signed)
Please take all of your antibiotics until finished!  Ibuprofen as needed for pain.  It is very important to follow up with your dentist as soon as possible!  Follow up with the neurosurgery clinic listed above for your back pain. This is the same clinic that your infectious disease doctor had referred you to earlier this year.  Return to ER for new or worsening symptoms, any additional concerns.

## 2016-08-25 ENCOUNTER — Telehealth: Payer: Self-pay | Admitting: *Deleted

## 2016-08-25 NOTE — Telephone Encounter (Signed)
Patient currently in [employer] for 30-days.  Unable to take Lyrica or Zanaflex in treatment center for his neck pain.  Patient is requesting a 30-day prescription for "high dose" gabapentin to take while in the treatment center.  He anticipates being discharged within 30 days.  He stated that he would call for his 22-monthfollow-up MD appointment once he is discharged.  Dr. HJohnnye Simaplease advise:  provide drug dose, directions, quantity and refills [facility] the [facility]

## 2016-08-26 ENCOUNTER — Other Ambulatory Visit: Payer: Self-pay | Admitting: Infectious Diseases

## 2016-08-26 MED ORDER — GABAPENTIN 300 MG PO CAPS: 300.0000 mg | ORAL_CAPSULE | Freq: Three times a day (TID) | ORAL | 2 refills | Status: DC

## 2016-08-26 NOTE — Telephone Encounter (Signed)
neurontin '300mg'$  po tid, #90 thanks

## 2016-08-30 ENCOUNTER — Other Ambulatory Visit: Payer: Self-pay

## 2016-08-30 MED ORDER — GABAPENTIN 300 MG PO CAPS: 300.0000 mg | ORAL_CAPSULE | Freq: Three times a day (TID) | ORAL | 2 refills | Status: DC

## 2016-08-30 NOTE — Telephone Encounter (Signed)
Patient called stating his medication was not at the pharmacy. Previous prescription was printed instead of being e-prescribed.. Reordered and e-prescribed. Called [facility] and verified prescription was received. Rodman Key, LPN

## 2016-11-24 ENCOUNTER — Other Ambulatory Visit: Payer: Medicare Other

## 2016-12-08 ENCOUNTER — Ambulatory Visit: Payer: Medicare Other | Admitting: Infectious Diseases

## 2017-04-05 ENCOUNTER — Other Ambulatory Visit: Payer: Self-pay | Admitting: Infectious Diseases

## 2017-04-05 DIAGNOSIS — B20 Human immunodeficiency virus [HIV] disease: Secondary | ICD-10-CM

## 2017-05-09 ENCOUNTER — Other Ambulatory Visit: Payer: Medicare Other

## 2017-05-09 DIAGNOSIS — B20 Human immunodeficiency virus [HIV] disease: Secondary | ICD-10-CM

## 2017-05-10 LAB — T-HELPER CELL (CD4) - (RCID CLINIC ONLY)
CD4 % Helper T Cell: 32 % — ABNORMAL LOW (ref 33–55)
CD4 T Cell Abs: 580 /uL (ref 400–2700)

## 2017-05-11 LAB — HIV-1 RNA QUANT-NO REFLEX-BLD
HIV 1 RNA QUANT: DETECTED {copies}/mL — AB
HIV-1 RNA QUANT, LOG: DETECTED {Log_copies}/mL — AB

## 2017-05-23 ENCOUNTER — Ambulatory Visit: Payer: Medicare Other | Admitting: Infectious Diseases

## 2017-06-18 ENCOUNTER — Emergency Department (HOSPITAL_BASED_OUTPATIENT_CLINIC_OR_DEPARTMENT_OTHER): Payer: Medicare Other

## 2017-06-18 ENCOUNTER — Emergency Department (HOSPITAL_BASED_OUTPATIENT_CLINIC_OR_DEPARTMENT_OTHER)
Admission: EM | Admit: 2017-06-18 | Discharge: 2017-06-18 | Disposition: A | Discharge status: Home / Self Care | Payer: Medicare Other | Attending: Emergency Medicine | Admitting: Emergency Medicine

## 2017-06-18 ENCOUNTER — Encounter (HOSPITAL_BASED_OUTPATIENT_CLINIC_OR_DEPARTMENT_OTHER): Payer: Self-pay | Admitting: Emergency Medicine

## 2017-06-18 DIAGNOSIS — N410 Acute prostatitis: Secondary | ICD-10-CM | POA: Diagnosis not present

## 2017-06-18 DIAGNOSIS — Z79899 Other long term (current) drug therapy: Secondary | ICD-10-CM | POA: Diagnosis not present

## 2017-06-18 DIAGNOSIS — R1030 Lower abdominal pain, unspecified: Secondary | ICD-10-CM | POA: Insufficient documentation

## 2017-06-18 DIAGNOSIS — F17210 Nicotine dependence, cigarettes, uncomplicated: Secondary | ICD-10-CM | POA: Diagnosis not present

## 2017-06-18 DIAGNOSIS — I10 Essential (primary) hypertension: Secondary | ICD-10-CM | POA: Diagnosis not present

## 2017-06-18 DIAGNOSIS — R39198 Other difficulties with micturition: Secondary | ICD-10-CM | POA: Diagnosis present

## 2017-06-18 LAB — COMPREHENSIVE METABOLIC PANEL
ALK PHOS: 79 U/L (ref 38–126)
ALT: 32 U/L (ref 17–63)
ANION GAP: 5 (ref 5–15)
AST: 25 U/L (ref 15–41)
Albumin: 3.9 g/dL (ref 3.5–5.0)
BILIRUBIN TOTAL: 2 mg/dL — AB (ref 0.3–1.2)
BUN: 9 mg/dL (ref 6–20)
CO2: 29 mmol/L (ref 22–32)
CREATININE: 0.98 mg/dL (ref 0.61–1.24)
Calcium: 9.3 mg/dL (ref 8.9–10.3)
Chloride: 107 mmol/L (ref 101–111)
GFR calc non Af Amer: >60 mL/min (ref >60)
GLUCOSE: 95 mg/dL (ref 65–99)
Potassium: 3.9 mmol/L (ref 3.5–5.1)
Sodium: 141 mmol/L (ref 135–145)
TOTAL PROTEIN: 7.3 g/dL (ref 6.5–8.1)

## 2017-06-18 LAB — URINALYSIS, ROUTINE W REFLEX MICROSCOPIC
Glucose, UA: NEGATIVE mg/dL
HGB URINE DIPSTICK: NEGATIVE
Ketones, ur: 15 mg/dL — AB
Leukocytes, UA: NEGATIVE
Nitrite: NEGATIVE
Protein, ur: NEGATIVE mg/dL
SPECIFIC GRAVITY, URINE: 1.029 (ref 1.005–1.030)
pH: 5.5 (ref 5.0–8.0)

## 2017-06-18 LAB — CBC WITH DIFFERENTIAL/PLATELET
BASOS ABS: 0 10*3/uL (ref 0.0–0.1)
BASOS PCT: 0 %
EOS ABS: 0.2 10*3/uL (ref 0.0–0.7)
Eosinophils Relative: 4 %
HCT: 40.7 % (ref 39.0–52.0)
HEMOGLOBIN: 13.5 g/dL (ref 13.0–17.0)
LYMPHS PCT: 50 %
Lymphs Abs: 2.9 10*3/uL (ref 0.7–4.0)
MCH: 23.9 pg — AB (ref 26.0–34.0)
MCHC: 33.2 g/dL (ref 30.0–36.0)
MCV: 72.2 fL — ABNORMAL LOW (ref 78.0–100.0)
MONO ABS: 0.4 10*3/uL (ref 0.1–1.0)
Monocytes Relative: 8 %
NEUTROS ABS: 2.1 10*3/uL (ref 1.7–7.7)
Neutrophils Relative %: 38 %
PLATELETS: 223 10*3/uL (ref 150–400)
RBC: 5.64 MIL/uL (ref 4.22–5.81)
RDW: 14.6 % (ref 11.5–15.5)
WBC: 5.6 10*3/uL (ref 4.0–10.5)

## 2017-06-18 MED ORDER — IOPAMIDOL (ISOVUE-300) INJECTION 61%
100.0000 mL | Freq: Once | INTRAVENOUS | Status: AC | PRN
  Administered 2017-06-18: 100 mL via INTRAVENOUS

## 2017-06-18 MED ORDER — SODIUM CHLORIDE 0.9 % IV BOLUS (SEPSIS)
500.0000 mL | Freq: Once | INTRAVENOUS | Status: AC
  Administered 2017-06-18: 500 mL via INTRAVENOUS

## 2017-06-18 MED ORDER — CIPROFLOXACIN HCL 500 MG PO TABS: 500.0000 mg | ORAL_TABLET | Freq: Two times a day (BID) | ORAL | 0 refills | Status: DC

## 2017-06-18 NOTE — Discharge Instructions (Signed)
You had a CT scan of your abdomen today that showed a pulmonary nodule as well as some changes around her aorta. Please follow-up with your family doctor for recheck and further assessment.

## 2017-06-18 NOTE — ED Provider Notes (Signed)
[facility] Provider Note   CSN: [id number] Arrival date & time: 06/18/17  1350  By signing my name below, I, Mayer Masker, attest that this documentation has been prepared under the direction and in the presence of No att. providers found. Electronically Signed: Mayer Masker, Scribe. 06/18/17. 4:12 PM.  History   Chief Complaint Chief Complaint  Patient presents with  . Abdominal Pain  . Dysuria   The history is provided by the patient. No language interpreter was used.    HPI Comments: Gary [name] is a 46 y.o. male with PMHx of HIV who presents to the Emergency Department complaining of constant, gradually worsening difficulty while urinating for 1.5 months. He has associated lower abdominal pain, pains while having BMs, nausea, constipation, night sweats, and weight loss (5-10 lbs in 1 month). Pt has not seen his PCP for his symptoms. He denies rashes, cough, SOB, fever, vomiting, testicular swelling, discharge, dysuria, and diarrhea. Pt is sexually active with no new partners. Pt has HIV and is compliant with his medications and his last CD4 count was ~700.  Past Medical History:  Diagnosis Date  . Bipolar disorder ([place])   . HIV (human immunodeficiency virus infection) ([place])   . HIV (human immunodeficiency virus infection) ([place])   . Hypertension   . Immune deficiency disorder [facility])     Patient Active Problem List   Diagnosis Date Noted  . Poison ivy dermatitis 06/07/2016  . Lymphadenopathy, supraclavicular 03/03/2016  . Smoking 10/06/2011  . Encounter for long-term (current) use of other medications 04/19/2011  . Human immunodeficiency virus (HIV) disease ([place]) 11/26/2009  . HERPES ZOSTER, HX OF 11/26/2009  . SICKLE-CELL TRAIT 09/25/2009  . BIPOLAR DISORDER UNSPECIFIED 09/25/2009  . ESSENTIAL HYPERTENSION, BENIGN 09/25/2009  . [place] DISEASE, CERVICAL 09/25/2009    Past Surgical History:  Procedure Laterality Date  . AMPUTATION  11/04/2011   Procedure:  AMPUTATION DIGIT;  Surgeon: Meredith Pel;  Location: [facility];  Service: Orthopedics;  Laterality: Right;  Right Second Toe Distal Phalanx Amputation  . BUNIONECTOMY    . CERVICAL FUSION         Home Medications    Prior to Admission medications   Medication Sig Start Date End Date Taking? Authorizing Provider  DESCOVY 200-25 MG tablet TAKE 1 TABLET BY MOUTH DAILY 04/05/17  Yes Campbell Riches, MD  EVOTAZ 300-150 MG tablet TAKE 1 TABLET BY MOUTH DAILY. SWALLOW WHOLE. DO NOT CRUSH, CUT OR CHEW TABLET. TAKE WITH FOOD 04/05/17  Yes Campbell Riches, MD  tiZANidine (ZANAFLEX) 4 MG tablet Take 4 mg by mouth every 6 (six) hours as needed for muscle spasms. Reported on 03/03/2016   Yes [provider]  acetaminophen (TYLENOL) 500 MG tablet Take 1,000 mg by mouth 3 (three) times daily as needed for pain. For pain    [provider]  ciprofloxacin (CIPRO) 500 MG tablet Take 1 tablet (500 mg total) by mouth 2 (two) times daily. 06/18/17   Quintella Reichert, MD  clindamycin (CLEOCIN) 300 MG capsule Take 1 capsule (300 mg total) by mouth 4 (four) times daily. 08/13/16   Ward, Ozella Almond, PA-C  gabapentin (NEURONTIN) 300 MG capsule Take 1 capsule (300 mg total) by mouth 3 (three) times daily. 08/30/16 08/30/17  Campbell Riches, MD  ibuprofen (ADVIL,MOTRIN) 600 MG tablet Take 1 tablet (600 mg total) by mouth every 8 (eight) hours as needed. 08/13/16   Ward, Ozella Almond, PA-C  predniSONE (DELTASONE) 20 MG tablet Take 1 tablet (20  mg total) by mouth daily with breakfast. 2 tabs po on day 1,  1 and 1/2 tab on day 2 & 3,  1 tab on days 4 & 5 1/2 tab on days 5 & 6 06/07/16   Campbell Riches, MD  pregabalin (LYRICA) 100 MG capsule Take 100 mg by mouth 2 (two) times daily. Reported on 03/03/2016    [provider]  tadalafil (CIALIS) 5 MG tablet Take 1 tablet (5 mg total) by mouth daily as needed for erectile dysfunction. Once every 48 h at most. Call MD if erection lasting  greater than 6 hours.  Use condoms. 06/07/16   Campbell Riches, MD    Family History No family history on file.  Social History Social History  Substance Use Topics  . Smoking status: Current Every Day Smoker    Packs/day: 0.25    Years: 15.00    Types: Cigarettes  . Smokeless tobacco: Never Used     Comment: cutting back, not ready to quit completely  . Alcohol use No     Allergies   Tivicay [dolutegravir]; Nsaids; Peach [prunus persica]; and Ultram [tramadol hcl]   Review of Systems Review of Systems  Constitutional: Positive for diaphoresis and unexpected weight change (5-10 lbs in 1 month). Negative for fever.  Respiratory: Negative for cough and shortness of breath.   Gastrointestinal: Positive for abdominal pain, constipation and nausea. Negative for diarrhea and vomiting.  Genitourinary: Positive for difficulty urinating. Negative for discharge, dysuria and scrotal swelling.  Skin: Negative for rash.  All other systems reviewed and are negative.    Physical Exam Updated Vital Signs BP 113/88 (BP Location: Right Arm)   Pulse (!) 54   Temp 98.2 F (36.8 C) (Oral)   Resp 18   Ht 5\' 8"  (1.727 m)   Wt 70.3 kg (155 lb)   SpO2 100%   BMI 23.57 kg/m   Physical Exam  Constitutional: He is oriented to person, place, and time. He appears well-developed and well-nourished.  HENT:  Head: Normocephalic and atraumatic.  Cardiovascular: Normal rate and regular rhythm.   No murmur heard. Pulmonary/Chest: Effort normal and breath sounds normal. No respiratory distress.  Abdominal: Soft. There is no tenderness. There is no rebound and no guarding.  Genitourinary: Penis normal. No penile tenderness.  Genitourinary Comments: Minor inguinal lymphadenopathy No hernia No scrotal swelling or tenderness  Musculoskeletal: He exhibits no edema or tenderness.  Neurological: He is alert and oriented to person, place, and time.  Skin: Skin is warm and dry.  Psychiatric: He  has a normal mood and affect. His behavior is normal.  Nursing note and vitals reviewed.    ED Treatments / Results  DIAGNOSTIC STUDIES: Oxygen Saturation is 99% on RA, normal by my interpretation.    COORDINATION OF CARE: 3:55 PM Discussed treatment plan with pt at bedside and pt agreed to plan. @5 :35 PM pt has mild prostate tenderness  Labs (all labs ordered are listed, but only abnormal results are displayed) Labs Reviewed  URINALYSIS, ROUTINE W REFLEX MICROSCOPIC - Abnormal; Notable for the following:       Result Value   Color, Urine AMBER (*)    APPearance CLOUDY (*)    Bilirubin Urine SMALL (*)    Ketones, ur 15 (*)    All other components within normal limits  COMPREHENSIVE METABOLIC PANEL - Abnormal; Notable for the following:    Total Bilirubin 2.0 (*)    All other components within normal limits  CBC  WITH DIFFERENTIAL/PLATELET - Abnormal; Notable for the following:    MCV 72.2 (*)    MCH 23.9 (*)    All other components within normal limits    EKG  EKG Interpretation None       Radiology Ct Abdomen Pelvis W Contrast  Result Date: 06/18/2017 CLINICAL DATA:  Lower abdominal pain with nausea, vomiting, and diarrhea. EXAM: CT ABDOMEN AND PELVIS WITH CONTRAST TECHNIQUE: Multidetector CT imaging of the abdomen and pelvis was performed using the standard protocol following bolus administration of intravenous contrast. CONTRAST:  170mL ISOVUE-300 IOPAMIDOL (ISOVUE-300) INJECTION 61% COMPARISON:  None. FINDINGS: Lower chest: A 4 mm nodule is seen in the lingula on series 4, image 9. A few mildly prominent paraesophageal nodes are identified without overt adenopathy. Lung bases are otherwise normal. Hepatobiliary: No focal liver abnormality is seen. No gallstones, gallbladder wall thickening, or biliary dilatation. Pancreas: Unremarkable. No pancreatic ductal dilatation or surrounding inflammatory changes. Spleen: Normal in size without focal abnormality. Adrenals/Urinary  Tract: Adrenal glands are unremarkable. Kidneys are normal, without renal calculi, focal lesion, or hydronephrosis. Bladder is unremarkable. Stomach/Bowel: The stomach and small bowel are normal. The colon and appendix are normal. Vascular/Lymphatic: The aorta and its branching vessels are normal in appearance. The IVC appears prominent in size, larger than the aorta. The common iliac veins are also relatively prominent compared to the iliac artery's. However, delayed images demonstrate enhancement of the IVC with no obvious filling defect. The attenuation of the IVC on initial imaging was 59 Hounsfield units while it is 100 Hounsfield units on delayed imaging. There is mild fat stranding in the retroperitoneum adjacent to the aorta and IVC. There is no aortic wall thickening. Shotty nodes in the retroperitoneum are identified without gross adenopathy. Reproductive: Prostate is unremarkable. Other: No free air or free fluid. A fat containing ventral hernias noted. Musculoskeletal: No acute or significant osseous findings. IMPRESSION: 1. There is increased attenuation in the retroperitoneal fat adjacent the aorta and IVC. The IVC is mildly prominent in caliber as are the common iliac veins, but there appears to be enhancement of the IVC on delayed imaging with no filling defect to suggest thrombus. Aortitis could also cause these findings but there is no wall thickening to definitively suggest inflammation of the aorta on this study. The cause of this finding is unclear on this study. An MRI could further assess this region as clinically warranted. Mildly prominent retroperitoneal nodes are likely reactive. 2. A 4 mm nodule is seen in the lingula. No follow-up needed if patient is low-risk. Non-contrast chest CT can be considered in 12 months if patient is high-risk. This recommendation follows the consensus statement: Guidelines for Management of Incidental Pulmonary Nodules Detected on CT Images: From the  Fleischner Society 2017; Radiology 2017; 284:228-243. 3. No other acute abnormalities. Electronically Signed   By: Dorise Bullion III M.D   On: 06/18/2017 17:22    Procedures Procedures (including critical care time)  Medications Ordered in ED Medications  sodium chloride 0.9 % bolus 500 mL (0 mLs Intravenous Stopped 06/18/17 1736)  iopamidol (ISOVUE-300) 61 % injection 100 mL (100 mLs Intravenous Contrast Given 06/18/17 1642)     Initial Impression / Assessment and Plan / ED Course  I have reviewed the triage vital signs and the nursing notes.  Pertinent labs & imaging results that were available during my care of the patient were reviewed by me and considered in my medical decision making (see chart for details).     Patient  here for evaluation of progressive difficulty urination for the last 1-1/2 months with intermittent abdominal pain. He is nontoxic appearing on examination with no peritoneal findings. CT abdomen obtained given symptoms with no evidence of bowel obstruction, mass. Given prostatic tenderness, urinary symptoms will treat for prostatitis. Discussed with patient home care, close outpatient follow up and return precautions.  CT scan with incidental pulmonary nodule. Discussed outpatient follow-up for this. CT also with changes around the aorta, current clinical picture is not consistent with aortitis.  Final Clinical Impressions(s) / ED Diagnoses   Final diagnoses:  Acute prostatitis    New Prescriptions Discharge Medication List as of 06/18/2017  5:41 PM    START taking these medications   Details  ciprofloxacin (CIPRO) 500 MG tablet Take 1 tablet (500 mg total) by mouth 2 (two) times daily., Starting Sat 06/18/2017, Print      I personally performed the services described in this documentation, which was scribed in my presence. The recorded information has been reviewed and is accurate.    Quintella Reichert, MD 06/18/17 2325

## 2017-06-18 NOTE — ED Notes (Signed)
Pt discharged to home with family. NAD.  

## 2017-06-18 NOTE — ED Triage Notes (Signed)
Pt reports disruprted urine streams while peeing along with some difficulty with urinating in general, and RLQ pan for past month that comes and goes.

## 2017-08-15 ENCOUNTER — Other Ambulatory Visit: Payer: Self-pay | Admitting: Infectious Diseases

## 2017-08-15 DIAGNOSIS — B20 Human immunodeficiency virus [HIV] disease: Secondary | ICD-10-CM

## 2017-08-23 ENCOUNTER — Other Ambulatory Visit: Payer: Self-pay | Admitting: *Deleted

## 2017-08-23 ENCOUNTER — Telehealth: Payer: Self-pay | Admitting: *Deleted

## 2017-08-23 DIAGNOSIS — B20 Human immunodeficiency virus [HIV] disease: Secondary | ICD-10-CM

## 2017-08-23 MED ORDER — ATAZANAVIR-COBICISTAT 300-150 MG PO TABS: 1.0000 | ORAL_TABLET | Freq: Every day | ORAL | 1 refills | Status: DC

## 2017-08-23 MED ORDER — EMTRICITABINE-TENOFOVIR AF 200-25 MG PO TABS: 1.0000 | ORAL_TABLET | Freq: Every day | ORAL | 1 refills | Status: DC

## 2017-08-23 NOTE — Telephone Encounter (Signed)
Patient last seen 05/2016.  He states he is out of medication (4 days only), did schedule at next available (10/15) with Dr Johnnye Sima. RN refilled medication through next visit, advised that future refills would occur at the MD visit. Landis Gandy, RN

## 2017-08-31 ENCOUNTER — Ambulatory Visit: Payer: Medicare Other | Admitting: Infectious Diseases

## 2017-09-19 ENCOUNTER — Other Ambulatory Visit: Payer: Medicare Other

## 2017-10-03 ENCOUNTER — Ambulatory Visit: Payer: Medicare Other | Admitting: Infectious Diseases

## 2017-10-24 ENCOUNTER — Other Ambulatory Visit: Payer: Medicare Other

## 2017-11-07 ENCOUNTER — Ambulatory Visit: Payer: Medicare Other | Admitting: Infectious Diseases

## 2017-11-15 ENCOUNTER — Ambulatory Visit (INDEPENDENT_AMBULATORY_CARE_PROVIDER_SITE_OTHER): Payer: Medicare Other | Admitting: Infectious Diseases

## 2017-11-15 ENCOUNTER — Encounter: Payer: Self-pay | Admitting: Infectious Diseases

## 2017-11-15 VITALS — BP 111/77 | HR 87 | Temp 99.0°F | Wt 151.0 lb

## 2017-11-15 DIAGNOSIS — Z23 Encounter for immunization: Secondary | ICD-10-CM | POA: Diagnosis not present

## 2017-11-15 DIAGNOSIS — Z789 Other specified health status: Secondary | ICD-10-CM | POA: Diagnosis not present

## 2017-11-15 DIAGNOSIS — M5030 Other cervical disc degeneration, unspecified cervical region: Secondary | ICD-10-CM

## 2017-11-15 DIAGNOSIS — R590 Localized enlarged lymph nodes: Secondary | ICD-10-CM

## 2017-11-15 DIAGNOSIS — Z113 Encounter for screening for infections with a predominantly sexual mode of transmission: Secondary | ICD-10-CM | POA: Diagnosis not present

## 2017-11-15 DIAGNOSIS — Z79899 Other long term (current) drug therapy: Secondary | ICD-10-CM

## 2017-11-15 DIAGNOSIS — B20 Human immunodeficiency virus [HIV] disease: Secondary | ICD-10-CM | POA: Diagnosis present

## 2017-11-15 MED ORDER — TIZANIDINE HCL 4 MG PO TABS: 4.0000 mg | ORAL_TABLET | Freq: Four times a day (QID) | ORAL | 0 refills | Status: DC | PRN

## 2017-11-15 MED ORDER — DARUN-COBIC-EMTRICIT-TENOFAF 800-150-200-10 MG PO TABS: 1.0000 | ORAL_TABLET | Freq: Every day | ORAL | 3 refills | Status: DC

## 2017-11-15 MED FILL — SYMTUZA 800-150-200-10 MG T: 800-150-200 | 30 days supply | Qty: 30 | Fill #0

## 2017-11-15 MED FILL — tiZANidine HCL 4 MG TABS: 4 | 8 days supply | Qty: 30 | Fill #0

## 2017-11-15 NOTE — Progress Notes (Signed)
HPI: Gary [name] is a 46 y.o. male who presents to the [facility] clinic today to follow-up with Dr. Johnnye Sima for his HIV infection.   Allergies: Allergies  Allergen Reactions  . Tivicay [Dolutegravir] Rash  . Nsaids Itching and Rash  . Peach [Prunus Persica] Hives  . Ultram [Tramadol Hcl] Rash    Past Medical History: Past Medical History:  Diagnosis Date  . Bipolar disorder ([place])   . HIV (human immunodeficiency virus infection) ([place])   . HIV (human immunodeficiency virus infection) ([place])   . Hypertension   . Immune deficiency disorder [facility])     Social History: Social History   Socioeconomic History  . Marital status: Married    Spouse name: None  . Number of children: None  . Years of education: None  . Highest education level: None  Social Needs  . Financial resource strain: None  . Food insecurity - worry: None  . Food insecurity - inability: None  . Transportation needs - medical: None  . Transportation needs - non-medical: None  Occupational History  . Occupation: disability   Tobacco Use  . Smoking status: Current Every Day Smoker    Packs/day: 0.25    Years: 15.00    Pack years: 3.75    Types: Cigarettes, Cigars  . Smokeless tobacco: Never Used  . Tobacco comment: cutting back, not ready to quit completely  Substance and Sexual Activity  . Alcohol use: No    Alcohol/week: 0.0 oz  . Drug use: Yes    Frequency: 3.0 times per week    Types: Marijuana    Comment: every day  . Sexual activity: Yes    Partners: Female    Comment: pt declined condoms  Other Topics Concern  . None  Social History Narrative  . None    Current Regimen: Evotaz + Descovy  Labs: HIV 1 RNA Quant (copies/mL)  Date Value  05/09/2017 <20 DETECTED (A)  01/20/2016 <20  11/12/2014 <20   CD4 T Cell Abs (/uL)  Date Value  05/09/2017 580  01/20/2016 760  11/12/2014 680   Hep B S Ab (no units)  Date Value  09/22/2011 POS (A)   Hepatitis B Surface Ag (no units)  Date  Value  09/22/2011 NEGATIVE   HCV Ab (no units)  Date Value  12/10/2009 NEG    CrCl: CrCl cannot be calculated (Patient's most recent lab result is older than the maximum 21 days allowed.).  Lipids:    Component Value Date/Time   CHOL 138 01/20/2016 1511   TRIG 71 01/20/2016 1511   HDL 44 01/20/2016 1511   CHOLHDL 3.1 01/20/2016 1511   VLDL 14 01/20/2016 1511   LDLCALC 80 01/20/2016 1511    Assessment: Gary [name] is here today to follow-up with Dr. Johnnye Sima for his HIV infection.  He has been out of care recently and was last seen in June of 2017.  He states he has been taking his Evotaz + Descovy every day until the last 5-6 days when he ran out. Dr. Johnnye Sima would like to switch him to a one pill regimen, so we decided on Symtuza.  I spent time going over [place] and any side effects associated with the medication.  I told him to take it with a meal every day.  He states he is pretty good about taking medications. He was undetectable the last 3 times we have checked. He will get it filed at [facility], and he will have it mailed to his mother in laws house.  He will come back and see pharmacy in ~1 month.  Plans: - Stop Evotaz and Descovy - Start Symtuza PO once daily with food - Mail from [facility] - F/u with me 12/27 at [place]. Lynnita Somma, PharmD, [place], [place] for Infectious Disease 11/15/2017, 3:12 PM

## 2017-11-15 NOTE — Assessment & Plan Note (Addendum)
He is doing well except for being off rx Will change him to symtuza. Discussed with pharm He will get at [place] with wife, HIV+ Has gotten flu shot Will give him mening rtc 3-4. With labs done then.

## 2017-11-15 NOTE — Progress Notes (Signed)
   Subjective:    Patient ID: Gary [name], male    DOB: July 09, 1971, 46 y.o.   MRN: [id number]  HPI 46 yo M with hx of HIV+, tobacco use. Has been in pain due to pinched nerve in his neck, believes he may have neuropathy. He underwent surgery for cervical fusion for this on 09-13-17.  He previously had noted supraclavicular LN- Ct showing multiple borderline LN. Non-specific.  HIV+ wife.  He has been on Evotaz/Descovy, however has been off more recently.  He has not been seen in ID since 05-2016.  He was seen in ED June 2018 for N/V and had CT showing: 1. There is increased attenuation in the retroperitoneal fat adjacent the aorta and IVC. The IVC is mildly prominent in caliber as are the common iliac veins, but there appears to be enhancement of the IVC on delayed imaging with no filling defect to suggest thrombus. Aortitis could also cause these findings but there is no wall thickening to definitively suggest inflammation of the aorta on this study. The cause of this finding is unclear on this study. An MRI could further assess this region as clinically warranted. Mildly prominent retroperitoneal nodes are likely reactive. 2. A 4 mm nodule is seen in the lingula.  HIV 1 RNA Quant (copies/mL)  Date Value  05/09/2017 <20 DETECTED (A)  01/20/2016 <20  11/12/2014 <20   CD4 T Cell Abs (/uL)  Date Value  05/09/2017 580  01/20/2016 760  11/12/2014 680   Has been feeling well. Still has sx of neuropathy.   Review of Systems  Constitutional: Negative for appetite change, chills, fever and unexpected weight change.  Respiratory: Positive for cough. Negative for shortness of breath.   Gastrointestinal: Negative for constipation and diarrhea.  Genitourinary: Negative for difficulty urinating.  Musculoskeletal: Positive for neck pain.  Neurological: Positive for numbness.  numbness in feet L > R.  Please see HPI. All other systems reviewed and negative.     Objective:   Physical  Exam  Constitutional: He appears well-developed and well-nourished.  HENT:  Mouth/Throat: No oropharyngeal exudate.  Eyes: EOM are normal. Pupils are equal, round, and reactive to light.  Neck: Neck supple.  Cardiovascular: Normal rate, regular rhythm and normal heart sounds.  Pulmonary/Chest: Effort normal and breath sounds normal.  Abdominal: Soft. Bowel sounds are normal. There is no tenderness. There is no rebound.  Musculoskeletal: He exhibits no edema.  Lymphadenopathy:    He has no cervical adenopathy.  Psychiatric: He has a normal mood and affect.      Assessment & Plan:

## 2017-11-15 NOTE — Assessment & Plan Note (Signed)
Will f/u at his next visit.  Consider repeat CT

## 2017-11-15 NOTE — Assessment & Plan Note (Signed)
Will f/u with his surgeon, PCP

## 2017-12-09 MED FILL — SYMTUZA 800-150-200-10 MG T: 800-150-200 | 30 days supply | Qty: 30 | Fill #1

## 2017-12-15 ENCOUNTER — Ambulatory Visit: Payer: Medicare Other

## 2018-01-04 ENCOUNTER — Ambulatory Visit: Payer: Medicare Other

## 2018-01-09 ENCOUNTER — Ambulatory Visit: Payer: Medicare Other

## 2018-01-10 ENCOUNTER — Other Ambulatory Visit: Payer: Medicare Other

## 2018-01-10 DIAGNOSIS — B20 Human immunodeficiency virus [HIV] disease: Secondary | ICD-10-CM

## 2018-01-10 DIAGNOSIS — Z113 Encounter for screening for infections with a predominantly sexual mode of transmission: Secondary | ICD-10-CM

## 2018-01-10 DIAGNOSIS — Z79899 Other long term (current) drug therapy: Secondary | ICD-10-CM

## 2018-01-11 LAB — CBC
HCT: 42.7 % (ref 38.5–50.0)
HEMOGLOBIN: 13.3 g/dL (ref 13.2–17.1)
MCH: 23 pg — AB (ref 27.0–33.0)
MCHC: 31.1 g/dL — ABNORMAL LOW (ref 32.0–36.0)
MCV: 73.9 fL — ABNORMAL LOW (ref 80.0–100.0)
MPV: 11.5 fL (ref 7.5–12.5)
Platelets: 261 10*3/uL (ref 140–400)
RBC: 5.78 10*6/uL (ref 4.20–5.80)
RDW: 14.9 % (ref 11.0–15.0)
WBC: 6.4 10*3/uL (ref 3.8–10.8)

## 2018-01-11 LAB — COMPLETE METABOLIC PANEL WITH GFR
AG Ratio: 1.6 (calc) (ref 1.0–2.5)
ALT: 18 U/L (ref 9–46)
AST: 19 U/L (ref 10–40)
Albumin: 4.4 g/dL (ref 3.6–5.1)
Alkaline phosphatase (APISO): 76 U/L (ref 40–115)
BUN: 12 mg/dL (ref 7–25)
CALCIUM: 9.6 mg/dL (ref 8.6–10.3)
CO2: 28 mmol/L (ref 20–32)
CREATININE: 1 mg/dL (ref 0.60–1.35)
Chloride: 106 mmol/L (ref 98–110)
GFR, EST AFRICAN AMERICAN: 106 mL/min/{1.73_m2} (ref >=60)
GFR, Est Non African American: 91 mL/min/{1.73_m2} (ref >=60)
Globulin: 2.7 g/dL (calc) (ref 1.9–3.7)
Glucose, Bld: 87 mg/dL (ref 65–99)
Potassium: 4.6 mmol/L (ref 3.5–5.3)
Sodium: 139 mmol/L (ref 135–146)
TOTAL PROTEIN: 7.1 g/dL (ref 6.1–8.1)
Total Bilirubin: 0.5 mg/dL (ref 0.2–1.2)

## 2018-01-11 LAB — LIPID PANEL
Cholesterol: 192 mg/dL (ref <200)
HDL: 53 mg/dL (ref >40)
LDL Cholesterol (Calc): 122 mg/dL (calc) — ABNORMAL HIGH
NON-HDL CHOLESTEROL (CALC): 139 mg/dL — AB (ref <130)
Total CHOL/HDL Ratio: 3.6 (calc) (ref <5.0)
Triglycerides: 77 mg/dL (ref <150)

## 2018-01-11 LAB — FLUORESCENT TREPONEMAL AB(FTA)-IGG-BLD: Fluorescent Treponemal ABS: NONREACTIVE

## 2018-01-11 LAB — RPR: RPR Ser Ql: REACTIVE — AB

## 2018-01-11 LAB — RPR TITER: RPR Titer: 1:1 {titer} — ABNORMAL HIGH

## 2018-01-11 LAB — T-HELPER CELL (CD4) - (RCID CLINIC ONLY)
CD4 % Helper T Cell: 31 % — ABNORMAL LOW (ref 33–55)
CD4 T Cell Abs: 760 /uL (ref 400–2700)

## 2018-01-12 ENCOUNTER — Telehealth: Payer: Self-pay | Admitting: Behavioral Health

## 2018-01-12 ENCOUNTER — Telehealth: Payer: Self-pay | Admitting: Pharmacy Technician

## 2018-01-12 LAB — HIV-1 RNA QUANT-NO REFLEX-BLD
HIV 1 RNA QUANT: DETECTED {copies}/mL — AB
HIV-1 RNA QUANT, LOG: DETECTED {Log_copies}/mL — AB

## 2018-01-12 NOTE — Telephone Encounter (Signed)
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

## 2018-01-12 NOTE — Telephone Encounter (Signed)
He is transferring his RX to [facility].  It is his preference.

## 2018-01-12 NOTE — Telephone Encounter (Signed)
Spoke with Gary [name] and gave him the results scheduled the next three nurse visits.

## 2018-01-17 ENCOUNTER — Ambulatory Visit (INDEPENDENT_AMBULATORY_CARE_PROVIDER_SITE_OTHER): Payer: Medicare Other

## 2018-01-17 DIAGNOSIS — Z202 Contact with and (suspected) exposure to infections with a predominantly sexual mode of transmission: Secondary | ICD-10-CM

## 2018-01-17 DIAGNOSIS — Z7189 Other specified counseling: Secondary | ICD-10-CM

## 2018-01-17 DIAGNOSIS — B20 Human immunodeficiency virus [HIV] disease: Secondary | ICD-10-CM

## 2018-01-17 DIAGNOSIS — Z7185 Encounter for immunization safety counseling: Secondary | ICD-10-CM

## 2018-01-17 MED ORDER — PENICILLIN G BENZATHINE 1200000 UNIT/2ML IM SUSP
2.4000 10*6.[IU] | Freq: Once | INTRAMUSCULAR | Status: AC
  Administered 2018-01-17: 2.4 10*6.[IU] via INTRAMUSCULAR

## 2018-01-24 ENCOUNTER — Ambulatory Visit: Payer: Medicare Other

## 2018-01-24 DIAGNOSIS — A539 Syphilis, unspecified: Secondary | ICD-10-CM | POA: Diagnosis not present

## 2018-01-24 MED ORDER — PENICILLIN G BENZATHINE 1200000 UNIT/2ML IM SUSP
1.2000 10*6.[IU] | Freq: Once | INTRAMUSCULAR | Status: AC
  Administered 2018-01-24: 1.2 10*6.[IU] via INTRAMUSCULAR

## 2018-01-31 ENCOUNTER — Ambulatory Visit (INDEPENDENT_AMBULATORY_CARE_PROVIDER_SITE_OTHER): Payer: Medicare Other | Admitting: *Deleted

## 2018-01-31 DIAGNOSIS — Z202 Contact with and (suspected) exposure to infections with a predominantly sexual mode of transmission: Secondary | ICD-10-CM

## 2018-01-31 DIAGNOSIS — B20 Human immunodeficiency virus [HIV] disease: Secondary | ICD-10-CM | POA: Diagnosis not present

## 2018-01-31 DIAGNOSIS — A539 Syphilis, unspecified: Secondary | ICD-10-CM

## 2018-01-31 MED ORDER — PENICILLIN G BENZATHINE 1200000 UNIT/2ML IM SUSP
1.2000 10*6.[IU] | Freq: Once | INTRAMUSCULAR | Status: AC
Start: 2018-01-31 — End: 2018-01-31
  Administered 2018-01-31: 1.2 10*6.[IU] via INTRAMUSCULAR

## 2018-01-31 MED ORDER — PENICILLIN G BENZATHINE 1200000 UNIT/2ML IM SUSP
1.2000 10*6.[IU] | Freq: Once | INTRAMUSCULAR | Status: AC
  Administered 2018-01-31: 1.2 10*6.[IU] via INTRAMUSCULAR

## 2018-01-31 NOTE — Addendum Note (Signed)
Addended by: Landis Gandy on: 01/31/2018 09:41 AM   Modules accepted: Orders

## 2018-02-01 ENCOUNTER — Other Ambulatory Visit: Payer: Self-pay | Admitting: *Deleted

## 2018-02-01 DIAGNOSIS — B20 Human immunodeficiency virus [HIV] disease: Secondary | ICD-10-CM

## 2018-02-01 MED ORDER — DARUN-COBIC-EMTRICIT-TENOFAF 800-150-200-10 MG PO TABS: 1.0000 | ORAL_TABLET | Freq: Every day | ORAL | 5 refills | Status: DC

## 2018-02-15 ENCOUNTER — Ambulatory Visit: Payer: Medicare Other | Admitting: Infectious Diseases

## 2018-02-22 ENCOUNTER — Ambulatory Visit: Payer: Medicare Other | Admitting: Infectious Diseases

## 2018-06-26 ENCOUNTER — Ambulatory Visit: Payer: Medicare Other | Admitting: Infectious Diseases

## 2018-06-29 ENCOUNTER — Other Ambulatory Visit: Payer: Self-pay | Admitting: *Deleted

## 2018-06-29 ENCOUNTER — Other Ambulatory Visit: Payer: Medicare Other

## 2018-06-29 DIAGNOSIS — B20 Human immunodeficiency virus [HIV] disease: Secondary | ICD-10-CM

## 2018-07-19 ENCOUNTER — Encounter: Payer: Medicare Other | Admitting: Infectious Diseases

## 2018-08-04 ENCOUNTER — Other Ambulatory Visit: Payer: Medicare Other

## 2018-08-04 DIAGNOSIS — B20 Human immunodeficiency virus [HIV] disease: Secondary | ICD-10-CM

## 2018-08-04 LAB — T-HELPER CELL (CD4) - (RCID CLINIC ONLY)
CD4 % Helper T Cell: 36 % (ref 33–55)
CD4 T CELL ABS: 760 /uL (ref 400–2700)

## 2018-08-08 LAB — CBC WITH DIFFERENTIAL/PLATELET
BASOS ABS: 27 {cells}/uL (ref 0–200)
Basophils Relative: 0.4 %
EOS ABS: 150 {cells}/uL (ref 15–500)
Eosinophils Relative: 2.2 %
HCT: 42.6 % (ref 38.5–50.0)
Hemoglobin: 13.4 g/dL (ref 13.2–17.1)
Lymphs Abs: 2244 cells/uL (ref 850–3900)
MCH: 23.9 pg — AB (ref 27.0–33.0)
MCHC: 31.5 g/dL — AB (ref 32.0–36.0)
MCV: 76.1 fL — AB (ref 80.0–100.0)
MPV: 11.5 fL (ref 7.5–12.5)
Monocytes Relative: 7.6 %
Neutro Abs: 3862 cells/uL (ref 1500–7800)
Neutrophils Relative %: 56.8 %
PLATELETS: 283 10*3/uL (ref 140–400)
RBC: 5.6 10*6/uL (ref 4.20–5.80)
RDW: 14.8 % (ref 11.0–15.0)
TOTAL LYMPHOCYTE: 33 %
WBC: 6.8 10*3/uL (ref 3.8–10.8)
WBCMIX: 517 {cells}/uL (ref 200–950)

## 2018-08-08 LAB — COMPLETE METABOLIC PANEL WITH GFR
AG RATIO: 1.7 (calc) (ref 1.0–2.5)
ALBUMIN MSPROF: 4.4 g/dL (ref 3.6–5.1)
ALKALINE PHOSPHATASE (APISO): 73 U/L (ref 40–115)
ALT: 13 U/L (ref 9–46)
AST: 16 U/L (ref 10–40)
BILIRUBIN TOTAL: 0.4 mg/dL (ref 0.2–1.2)
BUN: 13 mg/dL (ref 7–25)
CO2: 28 mmol/L (ref 20–32)
Calcium: 9.5 mg/dL (ref 8.6–10.3)
Chloride: 104 mmol/L (ref 98–110)
Creat: 1.09 mg/dL (ref 0.60–1.35)
GFR, EST AFRICAN AMERICAN: 95 mL/min/{1.73_m2} (ref >=60)
GFR, Est Non African American: 82 mL/min/{1.73_m2} (ref >=60)
Globulin: 2.6 g/dL (calc) (ref 1.9–3.7)
Glucose, Bld: 102 mg/dL — ABNORMAL HIGH (ref 65–99)
POTASSIUM: 4.7 mmol/L (ref 3.5–5.3)
Sodium: 139 mmol/L (ref 135–146)
TOTAL PROTEIN: 7 g/dL (ref 6.1–8.1)

## 2018-08-08 LAB — HIV-1 RNA QUANT-NO REFLEX-BLD
HIV 1 RNA QUANT: NOT DETECTED {copies}/mL
HIV-1 RNA Quant, Log: <1.3 Log copies/mL

## 2018-08-24 ENCOUNTER — Ambulatory Visit: Payer: Medicare Other | Admitting: Infectious Diseases

## 2018-08-30 ENCOUNTER — Other Ambulatory Visit: Payer: Self-pay | Admitting: Infectious Diseases

## 2018-08-30 DIAGNOSIS — B20 Human immunodeficiency virus [HIV] disease: Secondary | ICD-10-CM

## 2018-09-01 ENCOUNTER — Ambulatory Visit: Payer: Medicare Other | Admitting: Infectious Diseases

## 2018-09-06 ENCOUNTER — Ambulatory Visit: Payer: Medicare Other

## 2018-09-29 ENCOUNTER — Other Ambulatory Visit: Payer: Self-pay | Admitting: Infectious Diseases

## 2018-09-29 DIAGNOSIS — B20 Human immunodeficiency virus [HIV] disease: Secondary | ICD-10-CM

## 2018-10-27 ENCOUNTER — Telehealth: Payer: Self-pay | Admitting: *Deleted

## 2018-10-27 MED ORDER — ACETAMINOPHEN 325 MG PO TABS
650.00 | ORAL_TABLET | ORAL | Status: DC
Start: ? — End: 2018-10-27

## 2018-10-27 MED ORDER — GUAIFENESIN 100 MG/5ML PO SYRP
200.00 | ORAL_SOLUTION | ORAL | Status: DC
Start: ? — End: 2018-10-27

## 2018-10-27 MED ORDER — FLUOXETINE HCL 10 MG PO CAPS
10.00 | ORAL_CAPSULE | ORAL | Status: DC
Start: 2018-10-28 — End: 2018-10-27

## 2018-10-27 MED ORDER — PNEUMOCOCCAL VAC POLYVALENT 25 MCG/0.5ML IJ INJ
0.50 | INJECTION | INTRAMUSCULAR | Status: DC
Start: ? — End: 2018-10-27

## 2018-10-27 MED ORDER — SALINE NASAL SPRAY 0.65 % NA SOLN
1.00 | NASAL | Status: DC
Start: ? — End: 2018-10-27

## 2018-10-27 MED ORDER — NICOTINE 14 MG/24HR TD PT24
1.00 | MEDICATED_PATCH | TRANSDERMAL | Status: DC
Start: 2018-10-28 — End: 2018-10-27

## 2018-10-27 MED ORDER — GENERIC EXTERNAL MEDICATION
32.40 | Status: DC
Start: ? — End: 2018-10-27

## 2018-10-27 MED ORDER — ZIPRASIDONE MESYLATE 20 MG IM SOLR
20.00 | INTRAMUSCULAR | Status: DC
Start: ? — End: 2018-10-27

## 2018-10-27 MED ORDER — AMANTADINE HCL 100 MG PO CAPS
100.00 | ORAL_CAPSULE | ORAL | Status: DC
Start: 2018-10-27 — End: 2018-10-27

## 2018-10-27 MED ORDER — BENZOCAINE-MENTHOL 6-10 MG MT LOZG
1.00 | LOZENGE | OROMUCOSAL | Status: DC
Start: ? — End: 2018-10-27

## 2018-10-27 MED ORDER — DIVALPROEX SODIUM ER 500 MG PO TB24
500.00 | ORAL_TABLET | ORAL | Status: DC
Start: 2018-10-28 — End: 2018-10-27

## 2018-10-27 MED ORDER — BISACODYL 5 MG PO TBEC
10.00 | DELAYED_RELEASE_TABLET | ORAL | Status: DC
Start: ? — End: 2018-10-27

## 2018-10-27 MED ORDER — QUETIAPINE FUMARATE 25 MG PO TABS
25.00 | ORAL_TABLET | ORAL | Status: DC
Start: 2018-10-27 — End: 2018-10-27

## 2018-10-27 MED ORDER — HYDROXYZINE HCL 25 MG PO TABS
50.00 | ORAL_TABLET | ORAL | Status: DC
Start: ? — End: 2018-10-27

## 2018-10-27 MED ORDER — ALUMINUM-MAGNESIUM-SIMETHICONE 200-200-20 MG/5ML PO SUSP
30.00 | ORAL | Status: DC
Start: ? — End: 2018-10-27

## 2018-10-27 MED ORDER — GABAPENTIN 300 MG PO CAPS
300.00 | ORAL_CAPSULE | ORAL | Status: DC
Start: 2018-10-27 — End: 2018-10-27

## 2018-10-27 MED ORDER — LORAZEPAM 2 MG/ML IJ SOLN
2.00 | INTRAMUSCULAR | Status: DC
Start: ? — End: 2018-10-27

## 2018-10-27 MED ORDER — CLONIDINE HCL 0.1 MG PO TABS
0.10 | ORAL_TABLET | ORAL | Status: DC
Start: ? — End: 2018-10-27

## 2018-10-27 MED ORDER — INFLUENZA VAC SPLIT QUAD 0.5 ML IM SUSY
0.50 | PREFILLED_SYRINGE | INTRAMUSCULAR | Status: DC
Start: ? — End: 2018-10-27

## 2018-10-27 MED ORDER — HALOPERIDOL 5 MG PO TABS
5.00 | ORAL_TABLET | ORAL | Status: DC
Start: ? — End: 2018-10-27

## 2018-10-27 MED ORDER — PROMETHAZINE HCL 25 MG PO TABS
25.00 | ORAL_TABLET | ORAL | Status: DC
Start: ? — End: 2018-10-27

## 2018-10-27 MED ORDER — TRAZODONE HCL 100 MG PO TABS
100.00 | ORAL_TABLET | ORAL | Status: DC
Start: ? — End: 2018-10-27

## 2018-10-27 MED ORDER — EMTRICITABINE-TENOFOVIR AF 200-25 MG PO TABS
1.00 | ORAL_TABLET | ORAL | Status: DC
Start: 2018-10-28 — End: 2018-10-27

## 2018-10-27 MED ORDER — METHOCARBAMOL 750 MG PO TABS
1500.00 | ORAL_TABLET | ORAL | Status: DC
Start: ? — End: 2018-10-27

## 2018-10-27 MED ORDER — GENERIC EXTERNAL MEDICATION
5.00 | Status: DC
Start: ? — End: 2018-10-27

## 2018-10-27 MED ORDER — DOCUSATE SODIUM 100 MG PO CAPS
100.00 | ORAL_CAPSULE | ORAL | Status: DC
Start: ? — End: 2018-10-27

## 2018-10-27 MED ORDER — ATAZANAVIR SULFATE 150 MG PO CAPS
300.00 | ORAL_CAPSULE | ORAL | Status: DC
Start: 2018-10-28 — End: 2018-10-27

## 2018-10-27 MED ORDER — THIAMINE HCL 100 MG PO TABS
100.00 | ORAL_TABLET | ORAL | Status: DC
Start: 2018-10-28 — End: 2018-10-27

## 2018-10-27 NOTE — Telephone Encounter (Signed)
Thanks

## 2018-10-27 NOTE — Telephone Encounter (Signed)
Received call from Clair Gulling at [facility] letting us know that Mako is currently hospitalized at [facility]. RN thanked him for the information, asked for updated contact information on patient. Patient hasn't been at [facility] in 1 year, may need bridge referral once he is discharged. Landis Gandy, RN

## 2018-11-28 ENCOUNTER — Other Ambulatory Visit: Payer: Self-pay | Admitting: Infectious Diseases

## 2018-11-28 DIAGNOSIS — B20 Human immunodeficiency virus [HIV] disease: Secondary | ICD-10-CM

## 2019-02-03 ENCOUNTER — Other Ambulatory Visit: Payer: Self-pay | Admitting: Infectious Diseases

## 2019-02-03 DIAGNOSIS — B20 Human immunodeficiency virus [HIV] disease: Secondary | ICD-10-CM

## 2019-02-26 ENCOUNTER — Ambulatory Visit: Payer: Medicare Other | Admitting: Pharmacist

## 2019-03-02 ENCOUNTER — Telehealth: Payer: Self-pay | Admitting: Pharmacy Technician

## 2019-03-02 ENCOUNTER — Ambulatory Visit (INDEPENDENT_AMBULATORY_CARE_PROVIDER_SITE_OTHER): Payer: Medicare Other | Admitting: Pharmacist

## 2019-03-02 ENCOUNTER — Other Ambulatory Visit: Payer: Self-pay

## 2019-03-02 DIAGNOSIS — Z79899 Other long term (current) drug therapy: Secondary | ICD-10-CM | POA: Diagnosis not present

## 2019-03-02 DIAGNOSIS — B20 Human immunodeficiency virus [HIV] disease: Secondary | ICD-10-CM | POA: Diagnosis present

## 2019-03-02 LAB — T-HELPER CELL (CD4) - (RCID CLINIC ONLY)
CD4 % Helper T Cell: 40 % (ref 33–55)
CD4 T Cell Abs: 960 /uL (ref 400–2700)

## 2019-03-02 MED ORDER — DARUN-COBIC-EMTRICIT-TENOFAF 800-150-200-10 MG PO TABS: 1.0000 | ORAL_TABLET | Freq: Every day | ORAL | 3 refills | Status: DC

## 2019-03-02 NOTE — Telephone Encounter (Signed)
[facility] Patient Advocate Encounter    Findings of the benefits investigation conducted this morning via test claims for the patient's upcoming appointment on 03/02/2019 are as follows:   Insurance: [id number]- Medicare active Test run with drug historically used, will run another test claim if new drug prescribed Estimated copay amount: $3.90 (historic drug) Prior Authorization: not required at this time, tbd for new medication  [facility] Patient Advocate will follow up once patient arrives for their appointment.  Bartholomew Crews, CPhT Specialty Pharmacy Patient [facility] for Infectious Disease Phone: [phone] Fax: [phone] 03/02/2019 10:26 AM

## 2019-03-02 NOTE — Progress Notes (Signed)
HPI: Gary [name] is a 48 y.o. male with a significant history of depression and mood disorders who presents to the [facility] clinic for HIV follow-up, and resumption of care. Last clinic visit was 10/2017, and patient has had multiple no shows over the last year. Patient presented to ED 02/13/2019 for viral respiratory illness, several times in past year for alcohol abuse and/or intoxication, incidences of self-harm, and trauma.  Patient Active Problem List   Diagnosis Date Noted  . Hepatitis B immune 11/15/2017  . Lymphadenopathy, supraclavicular 03/03/2016  . Smoking 10/06/2011  . Encounter for long-term (current) use of other medications 04/19/2011  . Human immunodeficiency virus (HIV) disease ([place]) 11/26/2009  . HERPES ZOSTER, HX OF 11/26/2009  . SICKLE-CELL TRAIT 09/25/2009  . BIPOLAR DISORDER UNSPECIFIED 09/25/2009  . ESSENTIAL HYPERTENSION, BENIGN 09/25/2009  . DISC DISEASE, CERVICAL 09/25/2009    Patient's Medications  New Prescriptions   No medications on file  Previous Medications   ACETAMINOPHEN (TYLENOL) 500 MG TABLET    Take 1,000 mg by mouth 3 (three) times daily as needed for pain. For pain   GABAPENTIN (NEURONTIN) 300 MG CAPSULE    Take 1 capsule (300 mg total) by mouth 3 (three) times daily.   TIZANIDINE (ZANAFLEX) 4 MG TABLET    Take 1 tablet (4 mg total) by mouth every 6 (six) hours as needed for muscle spasms. Reported on 03/03/2016  Modified Medications   Modified Medication Previous Medication   DARUNAVIR-COBICISCTAT-EMTRICITABINE-TENOFOVIR ALAFENAMIDE (SYMTUZA) 800-150-200-10 MG TABS SYMTUZA 800-150-200-10 MG TABS      Take 1 tablet by mouth daily with breakfast.    TAKE 1 TABLET BY MOUTH DAILY WITH BREAKFAST  Discontinued Medications   CIPROFLOXACIN (CIPRO) 500 MG TABLET    Take 1 tablet (500 mg total) by mouth 2 (two) times daily.   CLINDAMYCIN (CLEOCIN) 300 MG CAPSULE    Take 1 capsule (300 mg total) by mouth 4 (four) times daily.   IBUPROFEN  (ADVIL,MOTRIN) 600 MG TABLET    Take 1 tablet (600 mg total) by mouth every 8 (eight) hours as needed.   PREDNISONE (DELTASONE) 20 MG TABLET    Take 1 tablet (20 mg total) by mouth daily with breakfast. 2 tabs po on day 1,  1 and 1/2 tab on day 2 & 3,  1 tab on days 4 & 5 1/2 tab on days 5 & 6   PREGABALIN (LYRICA) 100 MG CAPSULE    Take 100 mg by mouth 2 (two) times daily. Reported on 03/03/2016   SYMTUZA 800-150-200-10 MG TABS    TAKE 1 TABLET BY MOUTH DAILY WITH BREAKFAST    Allergies: Allergies  Allergen Reactions  . Tivicay [Dolutegravir] Rash  . Nsaids Itching and Rash  . Peach [Prunus Persica] Hives  . Ultram [Tramadol Hcl] Rash    Past Medical History: Past Medical History:  Diagnosis Date  . Bipolar disorder ([place])   . HIV (human immunodeficiency virus infection) ([place])   . HIV (human immunodeficiency virus infection) ([place])   . Hypertension   . Immune deficiency disorder [facility])     Social History: Social History   Socioeconomic History  . Marital status: Married    Spouse name: Not on file  . Number of children: Not on file  . Years of education: Not on file  . Highest education level: Not on file  Occupational History  . Occupation: disability   Social Needs  . Financial resource strain: Not on file  . Food insecurity:  Worry: Not on file    Inability: Not on file  . Transportation needs:    Medical: Not on file    Non-medical: Not on file  Tobacco Use  . Smoking status: Current Every Day Smoker    Packs/day: 0.25    Years: 15.00    Pack years: 3.75    Types: Cigarettes, Cigars  . Smokeless tobacco: Never Used  . Tobacco comment: cutting back, not ready to quit completely  Substance and Sexual Activity  . Alcohol use: No    Alcohol/week: 0.0 standard drinks  . Drug use: Yes    Frequency: 3.0 times per week    Types: Marijuana    Comment: every day  . Sexual activity: Yes    Partners: Female    Comment: pt declined condoms  Lifestyle  .  Physical activity:    Days per week: Not on file    Minutes per session: Not on file  . Stress: Not on file  Relationships  . Social connections:    Talks on phone: Not on file    Gets together: Not on file    Attends religious service: Not on file    Active member of club or organization: Not on file    Attends meetings of clubs or organizations: Not on file    Relationship status: Not on file  Other Topics Concern  . Not on file  Social History Narrative  . Not on file    Labs: Lab Results  Component Value Date   HIV1RNAQUANT <20 NOT DETECTED 08/04/2018   HIV1RNAQUANT <20 DETECTED (A) 01/10/2018   HIV1RNAQUANT <20 DETECTED (A) 05/09/2017   CD4TABS 760 08/04/2018   CD4TABS 760 01/10/2018   CD4TABS 580 05/09/2017    RPR and STI Lab Results  Component Value Date   LABRPR REACTIVE (A) 01/10/2018   LABRPR NON REAC 01/20/2016   LABRPR NON REAC 10/30/2013   LABRPR NON REAC 04/18/2013   LABRPR NON REAC 07/13/2012   RPRTITER 1:1 (H) 01/10/2018    No flowsheet data found.  Hepatitis B Lab Results  Component Value Date   HEPBSAB POS (A) 09/22/2011   HEPBSAG NEGATIVE 09/22/2011   Hepatitis C No results found for: HEPCAB, HCVRNAPCRQN Hepatitis A Lab Results  Component Value Date   HAV NEG 09/22/2011   Lipids: Lab Results  Component Value Date   CHOL 192 01/10/2018   TRIG 77 01/10/2018   HDL 53 01/10/2018   CHOLHDL 3.6 01/10/2018   VLDL 14 01/20/2016   LDLCALC 122 (H) 01/10/2018    Current HIV Regimen: Symtuza  Assessment: Gary [name] is a 48 y.o. male with a significant history of depression and mood disorders, HIV, and alcohol abuse, resuming care at [facility] today. Patient's wife is also HIV positive and on ART. Prior two clinic visits were 10/2017, and 05/2016, with Dr. Johnnye Sima & Dr. Eber Hong. Patient has had multiple no shows over the last year. When discussing reasons for missing appointments, patient reported that his life was not stable, that he has  been moving around, and that he is currently at a shelter in [facility]. He said that transportation is not an issue, as his wife has a car, and drove him to appointment.  Patient endorsed feeling well today, that he is practicing good hand hygiene, and that he received his flu shot 2-3 months ago. He endorsed that he is still drinking "some alcohol". Last HIV RNA <20, and CD4 760 on 08/04/18.   Patient started current regimen, Symtuza,  on 05/2016. Patient reported that is is "still getting refills" of his Symtuza. He reports no issues in getting his medications or affording his medications. He reports missing 5-6 doses per month. He reports that he is tolerating the medication well, and reported that he does not take with food. He reports he is still drinking alcohol, and that his mood is "going", but he reports that he still takes his Symtuza even when drinking or when his mood is poor.  Patient reported that he is currently taking gabapentin, tizanidine, and percocet. He reported he isn't taking anything for his mood yet, but he has an appointment 03/05/2019 to discuss starting something. Pharmacist informed patient that Jules Husbands can have interactions with some mood medications, and asked that he call us Monday if he is starting a new medication. He declined talking to in-clinic counselor Rollene Fare today since he has an appointment for Monday for his mood.  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 He also reported that his wife tested negative for syphilis.   Pharmacist review of immunizations indicated that he did not complete his HBV or his Men vaccination series, and that he is also indicated for Prevnar13 (received Pneumovax 23 x 2 doses), MMR series, Tdap, and VZV.   Plan: - Labs: HIV RNA VL, HIV genotype, CD4 count, CMET, lipid panel, CBC, RPR, CH/GN (urine) - Continue Symtuza, prescription sent to pharmacy - F/U  appointment with Janene Madeira on 05/15/2019 - At F/U, ask patient about completing immunizations (HBV, Men), or getting Prevnar13, or Tdap.  Alden Benjamin, PharmD Candidate, [place] for Infectious Disease 03/02/2019, 12:09 PM

## 2019-03-08 LAB — CBC
HEMATOCRIT: 44.3 % (ref 38.5–50.0)
HEMOGLOBIN: 13.5 g/dL (ref 13.2–17.1)
MCH: 23.2 pg — AB (ref 27.0–33.0)
MCHC: 30.5 g/dL — ABNORMAL LOW (ref 32.0–36.0)
MCV: 76.2 fL — AB (ref 80.0–100.0)
MPV: 10.8 fL (ref 7.5–12.5)
PLATELETS: 321 10*3/uL (ref 140–400)
RBC: 5.81 10*6/uL — AB (ref 4.20–5.80)
RDW: 14.2 % (ref 11.0–15.0)
WBC: 5.2 10*3/uL (ref 3.8–10.8)

## 2019-03-08 LAB — COMPREHENSIVE METABOLIC PANEL
AG Ratio: 1.5 (calc) (ref 1.0–2.5)
ALBUMIN MSPROF: 4.5 g/dL (ref 3.6–5.1)
ALKALINE PHOSPHATASE (APISO): 77 U/L (ref 36–130)
ALT: 32 U/L (ref 9–46)
AST: 19 U/L (ref 10–40)
BUN: 14 mg/dL (ref 7–25)
CALCIUM: 9.5 mg/dL (ref 8.6–10.3)
CO2: 27 mmol/L (ref 20–32)
CREATININE: 1.14 mg/dL (ref 0.60–1.35)
Chloride: 105 mmol/L (ref 98–110)
Globulin: 3 g/dL (calc) (ref 1.9–3.7)
Glucose, Bld: 72 mg/dL (ref 65–99)
POTASSIUM: 4.6 mmol/L (ref 3.5–5.3)
Sodium: 140 mmol/L (ref 135–146)
TOTAL PROTEIN: 7.5 g/dL (ref 6.1–8.1)
Total Bilirubin: 0.5 mg/dL (ref 0.2–1.2)

## 2019-03-08 LAB — LIPID PANEL
Cholesterol: 185 mg/dL (ref <200)
HDL: 48 mg/dL (ref >=40)
LDL Cholesterol (Calc): 116 mg/dL (calc) — ABNORMAL HIGH
Non-HDL Cholesterol (Calc): 137 mg/dL (calc) — ABNORMAL HIGH (ref <130)
Total CHOL/HDL Ratio: 3.9 (calc) (ref <5.0)
Triglycerides: 106 mg/dL (ref <150)

## 2019-03-08 LAB — RPR: RPR Ser Ql: NONREACTIVE

## 2019-03-08 LAB — HIV RNA, RTPCR W/R GT (RTI, PI,INT)
HIV 1 RNA Quant: <20 copies/mL
HIV-1 RNA QUANT, LOG: NOT DETECTED {Log_copies}/mL

## 2019-05-10 ENCOUNTER — Telehealth: Payer: Self-pay | Admitting: Infectious Diseases

## 2019-05-10 NOTE — Telephone Encounter (Signed)
COVID-19 Pre-Screening Questions: ° °Do you currently have a fever (>100 °F), chills or unexplained body aches? No  ° °Are you currently experiencing new cough, shortness of breath, sore throat, runny nose? No  °•  °Have you recently travelled outside the state of [place] in the last 14 days?no  °•  °Have you been in contact with someone that is currently pending confirmation of Covid19 testing or has been confirmed to have the Covid19 virus?  No  °

## 2019-05-15 ENCOUNTER — Other Ambulatory Visit: Payer: Self-pay

## 2019-05-15 ENCOUNTER — Ambulatory Visit (INDEPENDENT_AMBULATORY_CARE_PROVIDER_SITE_OTHER): Payer: Medicare Other | Admitting: Infectious Diseases

## 2019-05-15 ENCOUNTER — Encounter: Payer: Self-pay | Admitting: Infectious Diseases

## 2019-05-15 VITALS — Temp 98.3°F | Wt 156.0 lb

## 2019-05-15 DIAGNOSIS — I10 Essential (primary) hypertension: Secondary | ICD-10-CM | POA: Diagnosis not present

## 2019-05-15 DIAGNOSIS — B20 Human immunodeficiency virus [HIV] disease: Secondary | ICD-10-CM | POA: Diagnosis present

## 2019-05-15 NOTE — Patient Instructions (Addendum)
It was a pleasure to meet you today!  Please continue your Symtuza once a day with food if you can. Your medication seems to be working very well for you based on the labs back in March.   Will have you back in 3 months to check in again.   Good luck with your back appointment later today - will be hopeful that you feel better soon.

## 2019-05-15 NOTE — Progress Notes (Signed)
Name: Gary [name]  DOB: 1971/02/27 MRN: [id number] PCP: Shanon Rosser, PA-C    Patient Active Problem List   Diagnosis Date Noted  . Hepatitis B immune 11/15/2017  . Lymphadenopathy, supraclavicular 03/03/2016  . Smoking 10/06/2011  . Encounter for long-term (current) use of other medications 04/19/2011  . Human immunodeficiency virus (HIV) disease ([place]) 11/26/2009  . HERPES ZOSTER, HX OF 11/26/2009  . SICKLE-CELL TRAIT 09/25/2009  . BIPOLAR DISORDER UNSPECIFIED 09/25/2009  . ESSENTIAL HYPERTENSION, BENIGN 09/25/2009  . [place] DISEASE, CERVICAL 09/25/2009     Brief Narrative:  Gary [name]  is a 48 y.o. male with HIV disease, Dx ~2000. CD4 nadir unknown - never had to take any antibiotics VL unknown HIV Risk: heterosexual. Wife HIV +  History of OIs: unknown Intake Labs 2012: Hep B sAg (-), sAb (+), cAb (not done); Hep A (-), Hep C (-) Quantiferon (-) HLA B*5701 (-) G6PD: ()   Previous Regimens: . Evotaz + Descovy  . Symtuza  Genotypes: . None on file.  No mentioning of mutations in previous notes.  Subjective:   Chief Complaint  Patient presents with  . Follow-up     HPI: Mr. [name] is back in care.  He last saw our pharmacy team 2 months ago and restarted on his [place].  He tells me he is been pretty bad about taking his medication reliably with between 5 and 10 missed doses a month.  He is also had periods of significant time frames of lapse altogether.  He last saw Dr. Johnnye Sima in 2018 and switched from [place] to [place] once daily.  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  He had a 4 mm nodule seen in the lingula with a recommended CT scan in 12 months the patient was high risk for lung cancer.  He does continue to smoke but is down to 2 to 5 cigarettes a day.  He is not interested in quitting as this is his only "coping mechanism left."   Currently married to a male partner who is also HIV positive on medication.  Awaiting on neurosurgery evaluation of new onset lumbar pain and weakness.  He has had neck surgery in the past and has noticed some severe 10 out of 10 back pain with radiation to the legs.  He has no weakness and no falls but worries he may need surgery.  He uses marijuana daily to deal with pain control which does help.   Review of Systems  Constitutional: Negative for diaphoresis, fever and malaise/fatigue.  Musculoskeletal: Positive for back pain. Negative for falls.  Skin: Negative for rash.  Neurological: Negative for headaches.  Psychiatric/Behavioral: Negative for depression and substance abuse.  All other systems reviewed and are negative.   Past Medical History:  Diagnosis Date  . Bipolar disorder ([place])   . HIV (human immunodeficiency virus infection) ([place])   . HIV (human immunodeficiency virus infection) ([place])   . Hypertension   . Immune deficiency disorder [facility])     Outpatient Medications Prior to Visit  Medication Sig Dispense Refill  . acetaminophen (TYLENOL) 500 MG tablet Take 1,000 mg by mouth 3 (three) times daily as needed for pain. For pain    . Darunavir-Cobicisctat-Emtricitabine-Tenofovir Alafenamide (SYMTUZA) 800-150-200-10 MG TABS Take 1 tablet by mouth daily with breakfast. 30 tablet 3  . tiZANidine (ZANAFLEX) 4 MG tablet Take 1 tablet (4  mg total) by mouth every 6 (six) hours as needed for muscle spasms. Reported on 03/03/2016 30 tablet 0  . gabapentin (NEURONTIN) 300 MG capsule Take 1 capsule (300 mg total) by mouth 3 (three) times daily. 90 capsule 2   No facility-administered medications prior to visit.      Allergies  Allergen Reactions  . Tivicay [Dolutegravir] Rash  . Nsaids Itching and Rash  . Peach [Prunus Persica] Hives  . Ultram [Tramadol Hcl] Rash    Social History   Tobacco Use  . Smoking status: Current Every Day Smoker    Packs/day: 0.25    Years: 15.00     Pack years: 3.75    Types: Cigarettes, Cigars  . Smokeless tobacco: Never Used  . Tobacco comment: cutting back, not ready to quit completely  Substance Use Topics  . Alcohol use: No    Alcohol/week: 0.0 standard drinks  . Drug use: Yes    Frequency: 3.0 times per week    Types: Marijuana    Comment: every day    No family history on file.  Social History   Substance and Sexual Activity  Sexual Activity Yes  . Partners: Female   Comment: pt declined condoms     Objective:   Vitals:   05/15/19 1141  Temp: 98.3 F (36.8 C)  TempSrc: Oral  Weight: 156 lb (70.8 kg)   Body mass index is 23.72 kg/m.  Physical Exam Constitutional:      Appearance: He is well-developed.     Comments: Seated comfortably in chair during visit.   HENT:     Mouth/Throat:     Dentition: Normal dentition. No dental abscesses.  Cardiovascular:     Rate and Rhythm: Normal rate and regular rhythm.     Heart sounds: Normal heart sounds.  Pulmonary:     Effort: Pulmonary effort is normal.     Breath sounds: Normal breath sounds.  Abdominal:     General: There is no distension.     Palpations: Abdomen is soft.     Tenderness: There is no abdominal tenderness.  Lymphadenopathy:     Cervical: No cervical adenopathy.  Skin:    General: Skin is warm and dry.     Findings: No rash.  Neurological:     Mental Status: He is alert and oriented to person, place, and time.  Psychiatric:        Judgment: Judgment normal.     Comments: In good spirits today and engaged in care discussion.      Lab Results Lab Results  Component Value Date   WBC 5.2 03/02/2019   HGB 13.5 03/02/2019   HCT 44.3 03/02/2019   MCV 76.2 (L) 03/02/2019   PLT 321 03/02/2019    Lab Results  Component Value Date   CREATININE 1.14 03/02/2019   BUN 14 03/02/2019   NA 140 03/02/2019   K 4.6 03/02/2019   CL 105 03/02/2019   CO2 27 03/02/2019    Lab Results  Component Value Date   ALT 32 03/02/2019   AST 19  03/02/2019   ALKPHOS 79 06/18/2017   BILITOT 0.5 03/02/2019    Lab Results  Component Value Date   CHOL 185 03/02/2019   HDL 48 03/02/2019   LDLCALC 116 (H) 03/02/2019   TRIG 106 03/02/2019   CHOLHDL 3.9 03/02/2019   HIV 1 RNA Quant (copies/mL)  Date Value  03/02/2019 <20 NOT DETECTED  08/04/2018 <20 NOT DETECTED  01/10/2018 <20 DETECTED (A)  CD4 T Cell Abs (/uL)  Date Value  03/02/2019 960  08/04/2018 760  01/10/2018 760     Assessment & Plan:   Problem List Items Addressed This Visit      Unprioritized   ESSENTIAL HYPERTENSION, BENIGN    Blood pressure 120s over 90s today.  We discussed lifestyle changes that may help lower this to a normal range including keeping healthy good weight, drinking plenty of water and reducing to limit smoking.  He is not interested in smoking cessation at this time.  His blood pressure does not warrant treatment today.  Would continue to monitor.      Human immunodeficiency virus (HIV) disease ([place]) - Primary    Back on his Symtuza once daily.  His lab work 2 months ago reveals that he was undetectable.  He was little shocked to see this and thought that he would have been "worse off." Encouraged that his medication is working for him very nicely and he is doing a good job taking it but needs to tighten up adherence, which it sounds like he has done a good job lately.  He would like to check lab work again today for his reassurance as he has been really trying to do good changes to keep himself healthy in the long run with regards to his adherence. STI screening declined today. Condoms declined today. We will reassess need for vaccinations at upcoming appointment. Return in about 3 months (around 08/15/2019).       Relevant Orders   HIV-1 RNA quant-no reflex-bld   T-helper cell (CD4)- ([facility] clinic only)      Janene Madeira, MSN, NP-C [facility] [place] Pager: [phone] Office:  [phone]  05/15/19  5:05 PM

## 2019-05-15 NOTE — Assessment & Plan Note (Signed)
Blood pressure 120s over 90s today.  We discussed lifestyle changes that may help lower this to a normal range including keeping healthy good weight, drinking plenty of water and reducing to limit smoking.  He is not interested in smoking cessation at this time.  His blood pressure does not warrant treatment today.  Would continue to monitor.

## 2019-05-15 NOTE — Assessment & Plan Note (Signed)
Back on his Symtuza once daily.  His lab work 2 months ago reveals that he was undetectable.  He was little shocked to see this and thought that he would have been "worse off." Encouraged that his medication is working for him very nicely and he is doing a good job taking it but needs to tighten up adherence, which it sounds like he has done a good job lately.  He would like to check lab work again today for his reassurance as he has been really trying to do good changes to keep himself healthy in the long run with regards to his adherence. STI screening declined today. Condoms declined today. We will reassess need for vaccinations at upcoming appointment. Return in about 3 months (around 08/15/2019).

## 2019-05-16 LAB — T-HELPER CELL (CD4) - (RCID CLINIC ONLY)
CD4 % Helper T Cell: 40 % (ref 33–65)
CD4 T Cell Abs: 918 /uL (ref 400–1790)

## 2019-05-20 LAB — HIV-1 RNA QUANT-NO REFLEX-BLD
HIV 1 RNA Quant: <20 copies/mL — AB
HIV-1 RNA Quant, Log: <1.3 Log copies/mL — AB

## 2019-07-27 ENCOUNTER — Other Ambulatory Visit: Payer: Self-pay

## 2019-07-27 DIAGNOSIS — B20 Human immunodeficiency virus [HIV] disease: Secondary | ICD-10-CM

## 2019-07-27 MED ORDER — SYMTUZA 800-150-200-10 MG PO TABS: 1.0000 | ORAL_TABLET | Freq: Every day | ORAL | 3 refills | Status: DC

## 2019-07-27 NOTE — Telephone Encounter (Signed)
Prescription refill request via fax. Symtuza approved.  Eugenia Mcalpine, LPN

## 2019-09-02 ENCOUNTER — Other Ambulatory Visit: Payer: Self-pay | Admitting: Infectious Diseases

## 2019-09-02 DIAGNOSIS — B20 Human immunodeficiency virus [HIV] disease: Secondary | ICD-10-CM

## 2019-09-03 DIAGNOSIS — M5116 Intervertebral disc disorders with radiculopathy, lumbar region: Secondary | ICD-10-CM | POA: Insufficient documentation

## 2019-10-12 ENCOUNTER — Ambulatory Visit: Payer: Medicare Other | Admitting: Infectious Diseases

## 2019-11-23 ENCOUNTER — Ambulatory Visit: Payer: Medicare Other | Admitting: Infectious Diseases

## 2019-12-28 ENCOUNTER — Ambulatory Visit: Payer: Medicare Other | Admitting: Infectious Diseases

## 2019-12-29 ENCOUNTER — Other Ambulatory Visit: Payer: Self-pay | Admitting: Infectious Diseases

## 2019-12-29 DIAGNOSIS — B20 Human immunodeficiency virus [HIV] disease: Secondary | ICD-10-CM

## 2020-06-04 ENCOUNTER — Telehealth: Payer: Self-pay

## 2020-06-04 NOTE — Telephone Encounter (Signed)
Attempted to call patient after receiving refill request via fax for [place]. Patient has not been in office for lab work or follow up in over a year. Left voicemail requesting he call office back for appointment. Called [facility], [place] regarding for refill history. Per pharmacy patient refilled; 02/06/20,03/14/20, 05/09/20.  Will need to ask patient if he has missed any doses of medication.  Refills pending on patient returning call for appointment. [place]

## 2020-07-08 ENCOUNTER — Ambulatory Visit: Payer: Medicare Other | Admitting: Pharmacist

## 2020-10-08 ENCOUNTER — Other Ambulatory Visit: Payer: Self-pay | Admitting: Infectious Diseases

## 2020-10-08 DIAGNOSIS — B20 Human immunodeficiency virus [HIV] disease: Secondary | ICD-10-CM

## 2020-10-13 ENCOUNTER — Ambulatory Visit: Payer: Medicare Other | Admitting: Pharmacist

## 2020-10-13 NOTE — Progress Notes (Unsigned)
HPI: Gary [name] is a 49 y.o. male who presents to the [place] clinic for HIV follow-up.  Patient Active Problem List   Diagnosis Date Noted  . Hepatitis B immune 11/15/2017  . Lymphadenopathy, supraclavicular 03/03/2016  . Smoking 10/06/2011  . Encounter for long-term (current) use of other medications 04/19/2011  . Human immunodeficiency virus (HIV) disease ([place]) 11/26/2009  . HERPES ZOSTER, HX OF 11/26/2009  . SICKLE-CELL TRAIT 09/25/2009  . BIPOLAR DISORDER UNSPECIFIED 09/25/2009  . ESSENTIAL HYPERTENSION, BENIGN 09/25/2009  . [place]C DISEASE, CERVICAL 09/25/2009    Patient's Medications  New Prescriptions   No medications on file  Previous Medications   ACETAMINOPHEN (TYLENOL) 500 MG TABLET    Take 1,000 mg by mouth 3 (three) times daily as needed for pain. For pain   GABAPENTIN (NEURONTIN) 300 MG CAPSULE    Take 1 capsule (300 mg total) by mouth 3 (three) times daily.   SYMTUZA 800-150-200-10 MG TABS    TAKE 1 TABLET BY MOUTH DAILY WITH BREAKFAST   TIZANIDINE (ZANAFLEX) 4 MG TABLET    Take 1 tablet (4 mg total) by mouth every 6 (six) hours as needed for muscle spasms. Reported on 03/03/2016  Modified Medications   No medications on file  Discontinued Medications   No medications on file    Allergies: Allergies  Allergen Reactions  . Tivicay [Dolutegravir] Rash  . Nsaids Itching and Rash  . Peach [Prunus Persica] Hives  . Ultram [Tramadol Hcl] Rash    Past Medical History: Past Medical History:  Diagnosis Date  . Bipolar disorder ([place])   . HIV (human immunodeficiency virus infection) ([place])   . HIV (human immunodeficiency virus infection) ([place])   . Hypertension   . Immune deficiency disorder [facility])     Social History: Social History   Socioeconomic History  . Marital status: Married    Spouse name: Not on file  . Number of children: Not on file  . Years of education: Not on file  . Highest education level: Not on file  Occupational History  .  Occupation: disability   Tobacco Use  . Smoking status: Current Every Day Smoker    Packs/day: 0.25    Years: 15.00    Pack years: 3.75    Types: Cigarettes, Cigars  . Smokeless tobacco: Never Used  . Tobacco comment: cutting back, not ready to quit completely  Substance and Sexual Activity  . Alcohol use: No    Alcohol/week: 0.0 standard drinks  . Drug use: Yes    Frequency: 3.0 times per week    Types: Marijuana    Comment: every day  . Sexual activity: Yes    Partners: Female    Comment: pt declined condoms  Other Topics Concern  . Not on file  Social History Narrative  . Not on file   Social Determinants of Health   Financial Resource Strain:   . Difficulty of Paying Living Expenses: Not on file  Food Insecurity:   . Worried About Charity fundraiser in the Last Year: Not on file  . Ran Out of Food in the Last Year: Not on file  Transportation Needs:   . Lack of Transportation (Medical): Not on file  . Lack of Transportation (Non-Medical): Not on file  Physical Activity:   . Days of Exercise per Week: Not on file  . Minutes of Exercise per Session: Not on file  Stress:   . Feeling of Stress : Not on file  Social Connections:   .  Frequency of Communication with Friends and Family: Not on file  . Frequency of Social Gatherings with Friends and Family: Not on file  . Attends Religious Services: Not on file  . Active Member of Clubs or Organizations: Not on file  . Attends Archivist Meetings: Not on file  . Marital Status: Not on file    Labs: Lab Results  Component Value Date   HIV1RNAQUANT <20 DETECTED (A) 05/15/2019   HIV1RNAQUANT <20 NOT DETECTED 03/02/2019   HIV1RNAQUANT <20 NOT DETECTED 08/04/2018   CD4TABS 918 05/15/2019   CD4TABS 960 03/02/2019   CD4TABS 760 08/04/2018    RPR and STI Lab Results  Component Value Date   LABRPR NON-REACTIVE 03/02/2019   LABRPR REACTIVE (A) 01/10/2018   LABRPR NON REAC 01/20/2016   LABRPR NON REAC  10/30/2013   LABRPR NON REAC 04/18/2013   RPRTITER 1:1 (H) 01/10/2018    No flowsheet data found.  Hepatitis B Lab Results  Component Value Date   HEPBSAB POS (A) 09/22/2011   HEPBSAG NEGATIVE 09/22/2011   Hepatitis C No results found for: HEPCAB, HCVRNAPCRQN Hepatitis A Lab Results  Component Value Date   HAV NEG 09/22/2011   Lipids: Lab Results  Component Value Date   CHOL 185 03/02/2019   TRIG 106 03/02/2019   HDL 48 03/02/2019   CHOLHDL 3.9 03/02/2019   VLDL 14 01/20/2016   LDLCALC 116 (H) 03/02/2019    Current HIV Regimen: Symtuza  Assessment: Gary [name] presents today in good spirits for his HIV follow-up visit. He has missed several appointments and last visit >1 year ago. Last seen on 05/15/2019 with Colletta [place] and reported medication non-adherence with Symtuza (missed 10 doses a month). CD4 918 and VL undetectable as of May 2020. Per [facility] pharmacy refill history: 02/06/20, 03/14/20, 05/09/20.   HIV Med?? - Date? 30 vs 90 day supply? Refills left? Adherence How the patient is taking the medication Side Effects Started any new meds recently Drug Interactions Medication Reconciliation - go through current meds to see if taking Vaccinations  Any follow-up needed   Labs today: HIV viral load, CD4, BMET  Plan: ***   Lorel Monaco, PharmD PGY2 Ambulatory Care Resident [place]

## 2020-10-15 ENCOUNTER — Ambulatory Visit: Payer: Medicare Other | Admitting: Pharmacist

## 2020-10-19 ENCOUNTER — Other Ambulatory Visit: Payer: Self-pay | Admitting: Infectious Diseases

## 2020-10-19 DIAGNOSIS — B20 Human immunodeficiency virus [HIV] disease: Secondary | ICD-10-CM

## 2021-06-17 ENCOUNTER — Other Ambulatory Visit: Payer: Self-pay | Admitting: Family

## 2021-06-17 DIAGNOSIS — B20 Human immunodeficiency virus [HIV] disease: Secondary | ICD-10-CM

## 2022-04-05 DIAGNOSIS — F1220 Cannabis dependence, uncomplicated: Secondary | ICD-10-CM | POA: Insufficient documentation

## 2022-04-06 DIAGNOSIS — F15959 Other stimulant use, unspecified with stimulant-induced psychotic disorder, unspecified: Secondary | ICD-10-CM | POA: Insufficient documentation

## 2022-04-06 DIAGNOSIS — F332 Major depressive disorder, recurrent severe without psychotic features: Secondary | ICD-10-CM | POA: Insufficient documentation

## 2022-05-13 ENCOUNTER — Encounter: Payer: Self-pay | Admitting: Infectious Diseases

## 2022-06-25 ENCOUNTER — Encounter: Payer: Self-pay | Admitting: *Deleted

## 2022-06-25 NOTE — Congregational Nurse Program (Signed)
  Dept: [phone]   Congregational Nurse Program Note  Date of Encounter: 06/25/2022  Past Medical History: Past Medical History:  Diagnosis Date   Bipolar disorder ([place])    HIV (human immunodeficiency virus infection) ([place])    HIV (human immunodeficiency virus infection) ([place])    Hypertension    Immune deficiency disorder ([place])     Encounter Details:  CNP Questionnaire - 06/25/22 1338       Questionnaire   Do you give verbal consent to treat you today? Yes    Location Patient Served  [facility]    Visit Setting Church or Organization;Phone/Text/Email    Patient Status Homeless    [employer];Medicare    Insurance Referral N/A    Medication Have Medication Insecurities;Provided Medication Assistance    Medical Provider No    Medical Referral Non-[facility]inic;Non-[facility]    Medical Appointment Made Non-[facility] PCP[facility]clinic;Non-[facility]    Food N/A    Transportation Need transportation assistance;Provided transportation assistance    Housing/Utilities No permanent housing    Interpersonal Safety N/A    Intervention [place];Support    ED Visit Averted N/A    Life-Saving Intervention Made N/A            Client came to nurse's office requesting help with changing PCP to [facility], behavioral health services including therapy and medication, and dental services. Made an appt with [facility] for July 17th at 10:15 with Dr Carney Bern. Made an appt with [facility] July 11th at 11:00 with Mr. [facility]. Gave bus passes. Contacted [facility] and they do not accept adult medicaid/medicare. [facility] and they do not accept client's medicaid. Will continue to research for client.  Wilkins Elpers W RN CN

## 2022-08-25 ENCOUNTER — Emergency Department (HOSPITAL_COMMUNITY)
Admission: EM | Admit: 2022-08-25 | Discharge: 2022-08-25 | Disposition: A | Discharge status: Home / Self Care | Payer: Medicare Other | Attending: Emergency Medicine | Admitting: Emergency Medicine

## 2022-08-25 ENCOUNTER — Emergency Department (EMERGENCY_DEPARTMENT_HOSPITAL): Admit: 2022-08-25 | Discharge: 2022-08-25 | Disposition: A | Discharge status: Home / Self Care | Payer: Medicare Other

## 2022-08-25 ENCOUNTER — Encounter (HOSPITAL_COMMUNITY): Payer: Self-pay | Admitting: Emergency Medicine

## 2022-08-25 ENCOUNTER — Other Ambulatory Visit: Payer: Self-pay

## 2022-08-25 DIAGNOSIS — Z5900 Homelessness unspecified: Secondary | ICD-10-CM

## 2022-08-25 DIAGNOSIS — M25561 Pain in right knee: Secondary | ICD-10-CM | POA: Diagnosis not present

## 2022-08-25 DIAGNOSIS — I10 Essential (primary) hypertension: Secondary | ICD-10-CM | POA: Insufficient documentation

## 2022-08-25 DIAGNOSIS — Z21 Asymptomatic human immunodeficiency virus [HIV] infection status: Secondary | ICD-10-CM | POA: Diagnosis not present

## 2022-08-25 DIAGNOSIS — K047 Periapical abscess without sinus: Secondary | ICD-10-CM

## 2022-08-25 MED ORDER — IBUPROFEN 800 MG PO TABS
800.0000 mg | ORAL_TABLET | Freq: Once | ORAL | Status: AC
Start: 2022-08-25 — End: 2022-08-25
  Administered 2022-08-25: 800 mg via ORAL
  Filled 2022-08-25: qty 1

## 2022-08-25 MED ORDER — AMOXICILLIN 500 MG PO CAPS: 500.0000 mg | ORAL_CAPSULE | Freq: Three times a day (TID) | ORAL | 0 refills | Status: DC

## 2022-08-25 MED ORDER — IBUPROFEN 600 MG PO TABS: 600.0000 mg | ORAL_TABLET | Freq: Four times a day (QID) | ORAL | 0 refills | Status: DC | PRN

## 2022-08-25 NOTE — ED Triage Notes (Signed)
Per [facility] pt coming from [facility]- states 2 days ago while walking up a hill started having pain to behind right of knee. Patient fully extending his leg and resting it on his walker in triage.

## 2022-08-25 NOTE — Discharge Instructions (Addendum)
You were seen in the emergency department for knee pain and dental pain.  Your ultrasound was negative for a clot. I think your pain is related to your muscles. I have written you prescriptions for ibuprofen and antibiotics for your dental infection. I've attached a guide with some dentists in the area.
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
## 2022-08-25 NOTE — Progress Notes (Signed)
Right lower extremity venous duplex has been completed. Preliminary results can be found in CV Proc through chart review.  Results were given to [facility] PA.  08/25/22 3:24 PM Carlos Levering RVT

## 2022-11-20 ENCOUNTER — Encounter (HOSPITAL_COMMUNITY): Payer: Self-pay

## 2022-11-20 ENCOUNTER — Emergency Department (HOSPITAL_COMMUNITY)
Admission: EM | Admit: 2022-11-20 | Discharge: 2022-11-20 | Discharge status: Against Medical Advice | Payer: Medicare Other | Attending: Emergency Medicine | Admitting: Emergency Medicine

## 2022-11-20 ENCOUNTER — Other Ambulatory Visit: Payer: Self-pay

## 2022-11-20 DIAGNOSIS — R109 Unspecified abdominal pain: Secondary | ICD-10-CM | POA: Diagnosis present

## 2022-11-20 DIAGNOSIS — Z5321 Procedure and treatment not carried out due to patient leaving prior to being seen by health care provider: Secondary | ICD-10-CM | POA: Diagnosis not present

## 2022-11-20 LAB — LIPASE, BLOOD: Lipase: 24 U/L (ref 11–51)

## 2022-11-20 LAB — CBC WITH DIFFERENTIAL/PLATELET
Abs Immature Granulocytes: 0.02 10*3/uL (ref 0.00–0.07)
Basophils Absolute: 0 10*3/uL (ref 0.0–0.1)
Basophils Relative: 0 %
Eosinophils Absolute: 0.1 10*3/uL (ref 0.0–0.5)
Eosinophils Relative: 2 %
HCT: 41.8 % (ref 39.0–52.0)
Hemoglobin: 12.6 g/dL — ABNORMAL LOW (ref 13.0–17.0)
Immature Granulocytes: 0 %
Lymphocytes Relative: 25 %
Lymphs Abs: 1.5 10*3/uL (ref 0.7–4.0)
MCH: 23.3 pg — ABNORMAL LOW (ref 26.0–34.0)
MCHC: 30.1 g/dL (ref 30.0–36.0)
MCV: 77.3 fL — ABNORMAL LOW (ref 80.0–100.0)
Monocytes Absolute: 0.4 10*3/uL (ref 0.1–1.0)
Monocytes Relative: 7 %
Neutro Abs: 3.9 10*3/uL (ref 1.7–7.7)
Neutrophils Relative %: 66 %
Platelets: 282 10*3/uL (ref 150–400)
RBC: 5.41 MIL/uL (ref 4.22–5.81)
RDW: 15.1 % (ref 11.5–15.5)
WBC: 6 10*3/uL (ref 4.0–10.5)
nRBC: 0 % (ref 0.0–0.2)

## 2022-11-20 LAB — COMPREHENSIVE METABOLIC PANEL
ALT: 36 U/L (ref 0–44)
AST: 29 U/L (ref 15–41)
Albumin: 4 g/dL (ref 3.5–5.0)
Alkaline Phosphatase: 67 U/L (ref 38–126)
Anion gap: 10 (ref 5–15)
BUN: 19 mg/dL (ref 6–20)
CO2: 24 mmol/L (ref 22–32)
Calcium: 9.2 mg/dL (ref 8.9–10.3)
Chloride: 102 mmol/L (ref 98–111)
Creatinine, Ser: 0.85 mg/dL (ref 0.61–1.24)
GFR, Estimated: >60 mL/min (ref >60)
Glucose, Bld: 91 mg/dL (ref 70–99)
Potassium: 3.9 mmol/L (ref 3.5–5.1)
Sodium: 136 mmol/L (ref 135–145)
Total Bilirubin: 1.4 mg/dL — ABNORMAL HIGH (ref 0.3–1.2)
Total Protein: 8.1 g/dL (ref 6.5–8.1)

## 2022-11-20 NOTE — ED Notes (Signed)
Patient trys to come through triage saying "I need a room , I need help. I have ants all over me. Dont you see the,, something is crawling inside of me"

## 2022-11-20 NOTE — ED Triage Notes (Signed)
C/o urinary hesitancy and dribbling x1 year. Also c/o constipation x4 days

## 2022-11-20 NOTE — ED Provider Triage Note (Signed)
Emergency Medicine Provider Triage Evaluation Note  Gary [name] , a 51 y.o. male  was evaluated in triage.  Pt complains of abd pain. Report recurrent diffused abd pain, penile pain and scrotal pain for almost a year.  Feels constipated.  No fever, n/v/d, dysuria or rash  Review of Systems  Positive: As above Negative: As above  Physical Exam  BP 111/82   Pulse 71   Temp 98.2 F (36.8 C) (Oral)   Resp 18   Ht 5\' 8"  (1.727 m)   Wt 70 kg   SpO2 100%   BMI 23.46 kg/m  Gen:   Awake, no distress   Resp:  Normal effort  MSK:   Moves extremities without difficulty  Other:    Medical Decision Making  Medically screening exam initiated at 2:27 PM.  Appropriate orders placed.  Gary [name] was informed that the remainder of the evaluation will be completed by another provider, this initial triage assessment does not replace that evaluation, and the importance of remaining in the ED until their evaluation is complete.     Domenic Moras, PA-C 11/20/22 1428

## 2023-01-07 ENCOUNTER — Encounter: Payer: Self-pay | Admitting: Physician Assistant

## 2023-01-07 ENCOUNTER — Ambulatory Visit (INDEPENDENT_AMBULATORY_CARE_PROVIDER_SITE_OTHER): Payer: 59 | Admitting: Physician Assistant

## 2023-01-07 ENCOUNTER — Other Ambulatory Visit: Payer: Self-pay

## 2023-01-07 VITALS — BP 115/76 | HR 63 | Temp 97.6°F | Wt 149.0 lb

## 2023-01-07 DIAGNOSIS — Z79899 Other long term (current) drug therapy: Secondary | ICD-10-CM

## 2023-01-07 DIAGNOSIS — B20 Human immunodeficiency virus [HIV] disease: Secondary | ICD-10-CM | POA: Diagnosis not present

## 2023-01-07 MED ORDER — SYMTUZA 800-150-200-10 MG PO TABS: 1.0000 | ORAL_TABLET | Freq: Every day | ORAL | 2 refills | Status: DC
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
## 2023-01-07 NOTE — Patient Instructions (Signed)
Great meeting you today Gary [name]!  Symtuza sent to pharmacy, take every day at the same time Labs today I will text you results Food provided today [facility] case worker Kara Mead Declined vaccinations Will consider statin therapy once cholesterol results return Continue staying active

## 2023-01-10 LAB — CBC WITH DIFFERENTIAL/PLATELET
Absolute Monocytes: 534 cells/uL (ref 200–950)
Basophils Absolute: 42 cells/uL (ref 0–200)
Basophils Relative: 0.7 %
Eosinophils Absolute: 282 cells/uL (ref 15–500)
Eosinophils Relative: 4.7 %
HCT: 42.4 % (ref 38.5–50.0)
Hemoglobin: 13 g/dL — ABNORMAL LOW (ref 13.2–17.1)
Lymphs Abs: 2370 cells/uL (ref 850–3900)
MCH: 23.5 pg — ABNORMAL LOW (ref 27.0–33.0)
MCHC: 30.7 g/dL — ABNORMAL LOW (ref 32.0–36.0)
MCV: 76.5 fL — ABNORMAL LOW (ref 80.0–100.0)
MPV: 10.7 fL (ref 7.5–12.5)
Monocytes Relative: 8.9 %
Neutro Abs: 2772 cells/uL (ref 1500–7800)
Neutrophils Relative %: 46.2 %
Platelets: 317 10*3/uL (ref 140–400)
RBC: 5.54 10*6/uL (ref 4.20–5.80)
RDW: 14.5 % (ref 11.0–15.0)
Total Lymphocyte: 39.5 %
WBC: 6 10*3/uL (ref 3.8–10.8)

## 2023-01-10 LAB — COMPLETE METABOLIC PANEL WITH GFR
AG Ratio: 1.2 (calc) (ref 1.0–2.5)
ALT: 120 U/L — ABNORMAL HIGH (ref 9–46)
AST: 54 U/L — ABNORMAL HIGH (ref 10–40)
Albumin: 4.1 g/dL (ref 3.6–5.1)
Alkaline phosphatase (APISO): 137 U/L — ABNORMAL HIGH (ref 36–130)
BUN: 22 mg/dL (ref 7–25)
CO2: 26 mmol/L (ref 20–32)
Calcium: 9.6 mg/dL (ref 8.6–10.3)
Chloride: 106 mmol/L (ref 98–110)
Creat: 0.92 mg/dL (ref 0.60–1.29)
Globulin: 3.5 g/dL (calc) (ref 1.9–3.7)
Glucose, Bld: 89 mg/dL (ref 65–99)
Potassium: 4.5 mmol/L (ref 3.5–5.3)
Sodium: 138 mmol/L (ref 135–146)
Total Bilirubin: 0.4 mg/dL (ref 0.2–1.2)
Total Protein: 7.6 g/dL (ref 6.1–8.1)
eGFR: 102 mL/min/{1.73_m2} (ref >=60)

## 2023-01-10 LAB — URINALYSIS W MICROSCOPIC + REFLEX CULTURE
Bacteria, UA: NONE SEEN /HPF
Bilirubin Urine: NEGATIVE
Glucose, UA: NEGATIVE
Hgb urine dipstick: NEGATIVE
Hyaline Cast: NONE SEEN /LPF
Ketones, ur: NEGATIVE
Leukocyte Esterase: NEGATIVE
Nitrites, Initial: NEGATIVE
Protein, ur: NEGATIVE
RBC / HPF: NONE SEEN /HPF (ref 0–2)
Specific Gravity, Urine: 1.021 (ref 1.001–1.035)
Squamous Epithelial / HPF: NONE SEEN /HPF (ref <=5)
WBC, UA: NONE SEEN /HPF (ref 0–5)
pH: <=5 (ref 5.0–8.0)

## 2023-01-10 LAB — HIV-1 RNA QUANT-NO REFLEX-BLD
HIV 1 RNA Quant: <20 Copies/mL — ABNORMAL HIGH
HIV-1 RNA Quant, Log: <1.3 Log cps/mL — ABNORMAL HIGH

## 2023-01-10 LAB — HEPATITIS B SURFACE ANTIGEN: Hepatitis B Surface Ag: NONREACTIVE

## 2023-01-10 LAB — LIPID PANEL
Cholesterol: 191 mg/dL (ref <200)
HDL: 50 mg/dL (ref >=40)
LDL Cholesterol (Calc): 114 mg/dL (calc) — ABNORMAL HIGH
Non-HDL Cholesterol (Calc): 141 mg/dL (calc) — ABNORMAL HIGH (ref <130)
Total CHOL/HDL Ratio: 3.8 (calc) (ref <5.0)
Triglycerides: 159 mg/dL — ABNORMAL HIGH (ref <150)

## 2023-01-10 LAB — HEPATITIS C ANTIBODY: Hepatitis C Ab: NONREACTIVE

## 2023-01-10 LAB — RPR: RPR Ser Ql: NONREACTIVE

## 2023-01-10 LAB — T-HELPER CELLS (CD4) COUNT (NOT AT ARMC)
Absolute CD4: 789 cells/uL (ref 490–1740)
CD4 T Helper %: 35 % (ref 30–61)
Total lymphocyte count: 2287 cells/uL (ref 850–3900)

## 2023-01-10 LAB — NO CULTURE INDICATED

## 2023-01-13 ENCOUNTER — Telehealth: Payer: Self-pay

## 2023-01-13 NOTE — Telephone Encounter (Signed)
Patient aware of results. Patient voiced his understanding.   [place], CMA

## 2023-01-13 NOTE — Telephone Encounter (Signed)
-----  Message from Robert Bellow, [place] sent at 01/13/2023  4:07 PM EST ----- Please notify Tan his liver enzymes are elevated and should be further evaluated by primary care provider. Avoid alcohol use and Tylenol as these can impact his liver. I can recheck at his next visit. Kidney function is within normal limits. HIV viral load is undetectable, Hepatitis C and B non reactive, RPR non reactive, Urine is normal, CD4 789-immune system is strong.

## 2023-01-19 ENCOUNTER — Telehealth: Payer: Self-pay

## 2023-01-19 NOTE — Telephone Encounter (Signed)
I attempted to contact the patient to relay lab results. Patient does not a have a secured VM setup. Will try to call patient again. Gary [name] T Brooks Sailors

## 2023-01-19 NOTE — Telephone Encounter (Signed)
-----  Message from Robert Bellow, [place] sent at 01/19/2023 10:23 AM EST ----- Please notify Adith his liver enzymes are elevated and should be further evaluated by primary care provider. Avoid alcohol use and Tylenol as these can impact his liver. I can recheck at his next visit. Kidney function is within normal limits. HIV viral load is undetectable, Hepatitis C and B non reactive, RPR non reactive, Urine is normal, CD4 789-immune system is strong.

## 2023-01-20 ENCOUNTER — Other Ambulatory Visit: Payer: Self-pay

## 2023-01-20 ENCOUNTER — Ambulatory Visit: Payer: 59

## 2023-01-20 DIAGNOSIS — Z7689 Persons encountering health services in other specified circumstances: Secondary | ICD-10-CM

## 2023-01-20 DIAGNOSIS — Z9189 Other specified personal risk factors, not elsewhere classified: Secondary | ICD-10-CM

## 2023-01-20 NOTE — Telephone Encounter (Signed)
I spoke to the patient advised him of lab results. Patient also requested a referral for a new pcp. This referral has been placed and a dental referral. Patient also requested to come in and get Flu and Covid vaccines. Patient has been scheduled and will fill out dental form when he comes in for vaccines.  Gary [name]

## 2023-01-21 ENCOUNTER — Other Ambulatory Visit: Payer: Self-pay | Admitting: Orthopaedic Surgery

## 2023-01-21 DIAGNOSIS — M4322 Fusion of spine, cervical region: Secondary | ICD-10-CM

## 2023-01-26 ENCOUNTER — Ambulatory Visit: Payer: 59

## 2023-02-02 ENCOUNTER — Ambulatory Visit: Payer: 59

## 2023-02-07 ENCOUNTER — Other Ambulatory Visit: Payer: 59

## 2023-02-20 ENCOUNTER — Other Ambulatory Visit: Payer: 59

## 2023-02-21 ENCOUNTER — Emergency Department (HOSPITAL_COMMUNITY)
Admission: EM | Admit: 2023-02-21 | Discharge: 2023-02-22 | Disposition: A | Discharge status: Home / Self Care | Payer: 59 | Attending: Emergency Medicine | Admitting: Emergency Medicine

## 2023-02-21 ENCOUNTER — Other Ambulatory Visit: Payer: Self-pay

## 2023-02-21 ENCOUNTER — Encounter (HOSPITAL_COMMUNITY): Payer: Self-pay

## 2023-02-21 DIAGNOSIS — F10920 Alcohol use, unspecified with intoxication, uncomplicated: Secondary | ICD-10-CM

## 2023-02-21 DIAGNOSIS — Z5900 Homelessness unspecified: Secondary | ICD-10-CM | POA: Insufficient documentation

## 2023-02-21 DIAGNOSIS — Y903 Blood alcohol level of 60-79 mg/100 ml: Secondary | ICD-10-CM | POA: Diagnosis not present

## 2023-02-21 DIAGNOSIS — Z21 Asymptomatic human immunodeficiency virus [HIV] infection status: Secondary | ICD-10-CM | POA: Diagnosis not present

## 2023-02-21 DIAGNOSIS — F10129 Alcohol abuse with intoxication, unspecified: Secondary | ICD-10-CM | POA: Insufficient documentation

## 2023-02-21 DIAGNOSIS — Z79899 Other long term (current) drug therapy: Secondary | ICD-10-CM | POA: Diagnosis not present

## 2023-02-21 DIAGNOSIS — I10 Essential (primary) hypertension: Secondary | ICD-10-CM | POA: Insufficient documentation

## 2023-02-21 LAB — CBC WITH DIFFERENTIAL/PLATELET
Abs Immature Granulocytes: 0.01 10*3/uL (ref 0.00–0.07)
Basophils Absolute: 0 10*3/uL (ref 0.0–0.1)
Basophils Relative: 1 %
Eosinophils Absolute: 0 10*3/uL (ref 0.0–0.5)
Eosinophils Relative: 1 %
HCT: 43 % (ref 39.0–52.0)
Hemoglobin: 12.9 g/dL — ABNORMAL LOW (ref 13.0–17.0)
Immature Granulocytes: 0 %
Lymphocytes Relative: 23 %
Lymphs Abs: 1.2 10*3/uL (ref 0.7–4.0)
MCH: 23.3 pg — ABNORMAL LOW (ref 26.0–34.0)
MCHC: 30 g/dL (ref 30.0–36.0)
MCV: 77.6 fL — ABNORMAL LOW (ref 80.0–100.0)
Monocytes Absolute: 0.4 10*3/uL (ref 0.1–1.0)
Monocytes Relative: 7 %
Neutro Abs: 3.6 10*3/uL (ref 1.7–7.7)
Neutrophils Relative %: 68 %
Platelets: 297 10*3/uL (ref 150–400)
RBC: 5.54 MIL/uL (ref 4.22–5.81)
RDW: 14.2 % (ref 11.5–15.5)
WBC: 5.3 10*3/uL (ref 4.0–10.5)
nRBC: 0 % (ref 0.0–0.2)

## 2023-02-21 LAB — ETHANOL: Alcohol, Ethyl (B): 79 mg/dL — ABNORMAL HIGH (ref <10)

## 2023-02-21 LAB — COMPREHENSIVE METABOLIC PANEL
ALT: 27 U/L (ref 0–44)
AST: 23 U/L (ref 15–41)
Albumin: 4.2 g/dL (ref 3.5–5.0)
Alkaline Phosphatase: 69 U/L (ref 38–126)
Anion gap: 8 (ref 5–15)
BUN: 8 mg/dL (ref 6–20)
CO2: 22 mmol/L (ref 22–32)
Calcium: 8.9 mg/dL (ref 8.9–10.3)
Chloride: 106 mmol/L (ref 98–111)
Creatinine, Ser: 0.88 mg/dL (ref 0.61–1.24)
GFR, Estimated: >60 mL/min (ref >60)
Glucose, Bld: 99 mg/dL (ref 70–99)
Potassium: 4 mmol/L (ref 3.5–5.1)
Sodium: 136 mmol/L (ref 135–145)
Total Bilirubin: 0.6 mg/dL (ref 0.3–1.2)
Total Protein: 8.5 g/dL — ABNORMAL HIGH (ref 6.5–8.1)

## 2023-02-21 NOTE — ED Triage Notes (Signed)
BIB [facility] for ETOH detox. Unsure of how much he has drank today. Last drink 1730 tonight.

## 2023-02-21 NOTE — ED Provider Triage Note (Signed)
Emergency Medicine Provider Triage Evaluation Note  ROLLIN MESIDOR , a 52 y.o. male  was evaluated in triage.  Pt complains of alcohol abuse.  Pt trying to get in detox.  Pt unsure of how much he drank today.  Pt complains of feeling sic.  Pt reports HIV and Liver disease  Review of Systems  Positive: weakness Negative: fever  Physical Exam  BP (!) 140/76 (BP Location: Left Arm)   Pulse 64   Temp 97.6 F (36.4 C) (Oral)   Resp 16   Ht '5\' 8"'$  (1.727 m)   Wt 67.6 kg   SpO2 98%   BMI 22.66 kg/m  Gen:   Awake, no distress   Resp:  Normal effort  MSK:   Moves extremities without difficulty  Other:    Medical Decision Making  Medically screening exam initiated at 8:17 PM.  Appropriate orders placed.  CLIFORD DABISH was informed that the remainder of the evaluation will be completed by another provider, this initial triage assessment does not replace that evaluation, and the importance of remaining in the ED until their evaluation is complete.     Fransico Meadow, [place] 02/21/23 2042

## 2023-02-21 NOTE — ED Notes (Signed)
Pt ambulatory to bathroom utilizing rails on the wall of hallway for balance. No LOB noted. Steady but slow gait

## 2023-02-22 DIAGNOSIS — F10129 Alcohol abuse with intoxication, unspecified: Secondary | ICD-10-CM | POA: Diagnosis not present

## 2023-02-22 LAB — URINALYSIS, ROUTINE W REFLEX MICROSCOPIC
Bilirubin Urine: NEGATIVE
Glucose, UA: NEGATIVE mg/dL
Hgb urine dipstick: NEGATIVE
Ketones, ur: NEGATIVE mg/dL
Leukocytes,Ua: NEGATIVE
Nitrite: NEGATIVE
Protein, ur: NEGATIVE mg/dL
Specific Gravity, Urine: 1.006 (ref 1.005–1.030)
pH: 6 (ref 5.0–8.0)

## 2023-02-22 NOTE — ED Notes (Signed)
Patient ambulated in the bathroom without assistance.

## 2023-02-22 NOTE — ED Provider Notes (Addendum)
[facility] Provider Note   CSN: [id number] Arrival date & time: 02/21/23  1939     History  Chief Complaint  Patient presents with   Alcohol Problem    Gary [name] is a 52 y.o. male.  HPI   Medical history including HIV, hypertension, bipolar, presents with request of alcohol detox.  Patient states that he drank a lot yesterday, he does not know how much he drank, he states that he typically drinks about a bottle of wine daily, he never had to be hospitalized for withdrawals, he is not endorsing any suicidal homicidal ideations, he is not endorsing really any other complaints.    Reviewed patient's chart recently seen by infectious disease, currently compliant with his HIV medications, HIV viral load is undetectable, CD4 is elevated indicating a strong immune system.  Home Medications Prior to Admission medications   Medication Sig Start Date End Date Taking? Authorizing Provider  acetaminophen (TYLENOL) 500 MG tablet Take 1,000 mg by mouth 3 (three) times daily as needed for pain. For pain Patient not taking: Reported on 01/07/2023    [provider]  Darunavir-Cobicistat-Emtricitabine-Tenofovir Alafenamide [facility]) 800-150-200-10 MG TABS Take 1 tablet by mouth daily with breakfast. 01/07/23   Robert Bellow, PA-C      Allergies    Prunus persica, Tivicay [dolutegravir], Gabapentin, Nsaids, and Tramadol hcl    Review of Systems   Review of Systems  Constitutional:  Negative for chills and fever.  Respiratory:  Negative for shortness of breath.   Cardiovascular:  Negative for chest pain.  Gastrointestinal:  Negative for abdominal pain.  Neurological:  Negative for headaches.    Physical Exam Updated Vital Signs BP 105/60   Pulse 68   Temp 98 F (36.7 C)   Resp 16   Ht '5\' 8"'$  (1.727 m)   Wt 67.6 kg   SpO2 100%   BMI 22.66 kg/m  Physical Exam Vitals and nursing note reviewed.  Constitutional:       General: He is not in acute distress.    Appearance: He is not ill-appearing.  HENT:     Head: Normocephalic and atraumatic.     Nose: No congestion.  Eyes:     Conjunctiva/sclera: Conjunctivae normal.  Cardiovascular:     Rate and Rhythm: Normal rate and regular rhythm.     Pulses: Normal pulses.     Heart sounds: No murmur heard.    No friction rub. No gallop.  Pulmonary:     Effort: No respiratory distress.     Breath sounds: No wheezing, rhonchi or rales.  Abdominal:     Palpations: Abdomen is soft.     Tenderness: There is no abdominal tenderness. There is no right CVA tenderness or left CVA tenderness.  Musculoskeletal:     Comments: Spine was palpated was nontender to palpation no step-off or deformities noted.  No pelvis instability, patient got 5 out of 5 strength in the lower extremities, he has 2+ reflexes patellar bilaterally, sensation tact light touch.  He is ambulated without difficulty.  Skin:    General: Skin is warm and dry.  Neurological:     Mental Status: He is alert.     Comments: Patient is nontremulous on my exam, does not appear to be responding to internal stimuli, not endorsing any suicidal homicidal ideations.  Psychiatric:        Mood and Affect: Mood normal.     ED Results / Procedures / Treatments  Labs (all labs ordered are listed, but only abnormal results are displayed) Labs Reviewed  CBC WITH DIFFERENTIAL/PLATELET - Abnormal; Notable for the following components:      Result Value   Hemoglobin 12.9 (*)    MCV 77.6 (*)    MCH 23.3 (*)    All other components within normal limits  COMPREHENSIVE METABOLIC PANEL - Abnormal; Notable for the following components:   Total Protein 8.5 (*)    All other components within normal limits  ETHANOL - Abnormal; Notable for the following components:   Alcohol, Ethyl (B) 79 (*)    All other components within normal limits  URINALYSIS, ROUTINE W REFLEX MICROSCOPIC - Abnormal; Notable for the following  components:   Color, Urine STRAW (*)    All other components within normal limits    EKG None  Radiology No results found.  Procedures Procedures    Medications Ordered in ED Medications - No data to display  ED Course/ Medical Decision Making/ A&P                             Medical Decision Making Amount and/or Complexity of Data Reviewed Labs: ordered.   This patient presents to the ED for concern of request for detox, this involves an extensive number of treatment options, and is a complaint that carries with it a high risk of complications and morbidity.  The differential diagnosis includes withdrawals, metabolic derangement, psychiatric emergency    Additional history obtained:  Additional history obtained from N/A External records from outside source obtained and reviewed including infectious disease   Co morbidities that complicate the patient evaluation  HIV, psychiatric disorder  Social Determinants of Health:  Homeless    Lab Tests:  I Ordered, and personally interpreted labs.  The pertinent results include: CBC is unremarkable, CMP unremarkable, ethanol 79, UA unremarkable   Imaging Studies ordered:  I ordered imaging studies including n/a I independently visualized and interpreted imaging which showed n/a I agree with the radiologist interpretation   Cardiac Monitoring:  The patient was maintained on a cardiac monitor.  I personally viewed and interpreted the cardiac monitored which showed an underlying rhythm of: n/a   Medicines ordered and prescription drug management:  I ordered medication including n/a I have reviewed the patients home medicines and have made adjustments as needed  Critical Interventions:  N/a   Reevaluation:  Presents with request of alcohol detox, triage obtain basic lab work which appears reviewed was unremarkable, on my exam he had a benign physical exam, he endorses some difficulty urination, he had  very minimal suprapubic pain, will further assess with a UA.  UA is unremarkable, he was found asleep having no complaints he is agreeable discharge at this time.  Consultations Obtained:  N/a    Test Considered:  N/a    Rule out Suspicion for UTI Pilo or kidney stone is low no evidence of infection or hematuria seen on UA complains no CVA tenderness.  I doubt spinal equina or spinal cord abnormality as he has no red flag symptoms present my exam.  Suspicion for alcohol withdrawals is low as he is nontraumatic my exam vital signs are reassuring.  I doubt psychiatric emergency he does not endorse any suicidal homicidal ideations, does not appear to respond to internal stimuli.    Dispostion and problem list  After consideration of the diagnostic results and the patients response to treatment, I feel that the patent  would benefit from discharge.  Intoxication-provide patient with outpatient resources for alcohol use. Homelessness-will provide patient with information in regards to shelters within the area. Difficulty with urination-unclear etiology, will have him follow-up with community health and wellness for further evaluation            Final Clinical Impression(s) / ED Diagnoses Final diagnoses:  Alcoholic intoxication without complication [facility])    Rx / DC Orders ED Discharge Orders     None         Marcello Fennel, PA-C 02/22/23 0520    Marcello Fennel, PA-C 02/22/23 [id number]    Godfrey Pick, MD 02/22/23 2251

## 2023-02-22 NOTE — Discharge Instructions (Addendum)
Alcohol use-if you need help with alcohol consumption, given you resources for outpatient help please review, may also follow-up with [facility] behavioral health urgent care for further evaluation Housing stability-provided you with information in regards to shelters within the area please review Difficulty with urination-please follow-up with community health and wellness for reevaluation.  Come back to the emergency department if you develop chest pain, shortness of breath, severe abdominal pain, uncontrolled nausea, vomiting, diarrhea.

## 2023-02-27 ENCOUNTER — Ambulatory Visit
Admission: RE | Admit: 2023-02-27 | Discharge: 2023-02-27 | Disposition: A | Discharge status: Home / Self Care | Payer: 59 | Source: Ambulatory Visit | Attending: Orthopaedic Surgery | Admitting: Orthopaedic Surgery

## 2023-02-27 DIAGNOSIS — M4322 Fusion of spine, cervical region: Secondary | ICD-10-CM

## 2023-04-07 ENCOUNTER — Ambulatory Visit: Payer: 59 | Admitting: Physician Assistant

## 2023-04-18 ENCOUNTER — Ambulatory Visit: Payer: 59 | Admitting: Physician Assistant

## 2023-04-27 ENCOUNTER — Encounter: Payer: Self-pay | Admitting: Physician Assistant

## 2023-04-27 ENCOUNTER — Ambulatory Visit (INDEPENDENT_AMBULATORY_CARE_PROVIDER_SITE_OTHER): Payer: 59 | Admitting: Physician Assistant

## 2023-04-27 ENCOUNTER — Other Ambulatory Visit: Payer: Self-pay

## 2023-04-27 VITALS — BP 112/80 | HR 62 | Temp 98.1°F | Resp 16 | Wt 136.4 lb

## 2023-04-27 DIAGNOSIS — B20 Human immunodeficiency virus [HIV] disease: Secondary | ICD-10-CM | POA: Diagnosis not present

## 2023-04-27 DIAGNOSIS — Z79899 Other long term (current) drug therapy: Secondary | ICD-10-CM

## 2023-04-27 MED ORDER — SYMTUZA 800-150-200-10 MG PO TABS: 1.0000 | ORAL_TABLET | Freq: Every day | ORAL | 5 refills | Status: DC

## 2023-04-27 NOTE — Progress Notes (Signed)
Subjective:    Patient ID: Gary [name], male    DOB: Sep 27, 1971, 52 y.o.   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  He is adherent to [facility], has missed 3 pills due to lack of refills.  He is tolerating medication well. He has been asymptomatic since last visit. He is sexually active rarely, with ex wife.  Declines STI testing.  They divorced 2 years ago.  He is not working, receive disability.  He is no longer living on the streets, [employer]  assisted him with finding an apartment of his own. His mood is stable and he is sleeping well every night.   No alcohol since 02/21/23 Illicit drugs THC daily Vape tobacco  Requests food, bus passes and is waiting on dental referral.     Allergies  Allergen Reactions   Prunus Persica Hives    Only fresh D/T THE FUZZ   Tivicay [Dolutegravir] Rash   Gabapentin    Nsaids Itching and Rash   Tramadol Hcl Itching and Rash    Can take Hydrocodone, Oxycodone      Outpatient Medications Prior to Visit  Medication Sig Dispense Refill   acetaminophen (TYLENOL) 500 MG tablet Take 1,000 mg by mouth 3 (three) times daily as needed for pain. For pain     Darunavir-Cobicistat-Emtricitabine-Tenofovir Alafenamide (SYMTUZA) 800-150-200-10 MG TABS Take 1 tablet by mouth daily with breakfast. 30 tablet 2   No facility-administered medications prior to visit.     Past Medical History:  Diagnosis Date   Bipolar disorder (HCC)    HIV (human immunodeficiency virus infection) (HCC)    HIV (human immunodeficiency virus infection) (HCC)    Hypertension    Immune deficiency disorder [facility])      Past Surgical History:  Procedure Laterality Date   AMPUTATION  11/04/2011   Procedure: AMPUTATION DIGIT;  Surgeon: Cammy Copa;  Location: [facility] OR;  Service: Orthopedics;  Laterality: Right;  Right Second Toe Distal Phalanx Amputation    BUNIONECTOMY     CERVICAL FUSION         Review of Systems  Constitutional:  Negative for appetite change, chills, fatigue and fever.  HENT: Negative.    Eyes:  Negative for visual disturbance.  Respiratory:  Negative for cough, shortness of breath and wheezing.   Cardiovascular:  Negative for chest pain, palpitations and leg swelling.  Gastrointestinal:  Negative for abdominal pain, diarrhea, rectal pain and vomiting.  Skin:  Negative for rash.  Neurological:  Negative for dizziness, weakness, light-headedness and headaches.  Hematological:  Negative for adenopathy.      Objective:    BP 112/80   Pulse 62   Temp 98.1 F (36.7 C) (Oral)   Resp 16   Wt 136 lb 6.4 oz (61.9 kg)   SpO2 98%   BMI 20.74 kg/m  Nursing note and vital signs reviewed.  Physical Exam Vitals reviewed.  Constitutional:      General: He is not in acute distress.    Appearance: He is normal weight. He is not ill-appearing or toxic-appearing.  HENT:     Head: Normocephalic and atraumatic.  Eyes:     Extraocular Movements: Extraocular movements intact.     Conjunctiva/sclera: Conjunctivae normal.     Pupils: Pupils are equal, round, and reactive to light.  Cardiovascular:     Rate and  Rhythm: Normal rate and regular rhythm.  Pulmonary:     Effort: Pulmonary effort is normal.     Breath sounds: Normal breath sounds.  Skin:    General: Skin is warm and dry.  Neurological:     General: No focal deficit present.     Mental Status: He is alert and oriented to person, place, and time. Mental status is at baseline.  Psychiatric:        Mood and Affect: Mood normal.        Behavior: Behavior normal.        Thought Content: Thought content normal.        Judgment: Judgment normal.         04/27/2023    9:58 AM 01/07/2023    9:27 AM 05/15/2019   11:44 AM 11/15/2017    2:20 PM 12/10/2014    3:53 PM  Depression screen PHQ 2/9  Decreased Interest 0 0 0 1 1  Down, Depressed, Hopeless 0 0 0 1 1   PHQ - 2 Score 0 0 0 2 2  Altered sleeping     0  Tired, decreased energy     1  Change in appetite     0  Feeling bad or failure about yourself      1  Trouble concentrating     0  Moving slowly or fidgety/restless     0  Suicidal thoughts     0  PHQ-9 Score     4       Assessment & Plan:  HIV-1: tolerating and adherent to Symtuza daily, 3 missed pills total. LAST OPV 01/07/23 HIV ND and CD4 789. Asymptomatic. Stable housing. Bus passes provided x 4 Food bags x 2 Dental referral advised to call margaret to set up Follow up in 4 months   Patient Active Problem List   Diagnosis Date Noted   Hepatitis B immune 11/15/2017   Lymphadenopathy, supraclavicular 03/03/2016   Smoking 10/06/2011   Encounter for long-term (current) use of other medications 04/19/2011   Human immunodeficiency virus (HIV) disease (HCC) 11/26/2009   HERPES ZOSTER, HX OF 11/26/2009   SICKLE-CELL TRAIT 09/25/2009   BIPOLAR DISORDER UNSPECIFIED 09/25/2009   ESSENTIAL HYPERTENSION, BENIGN 09/25/2009   DISC DISEASE, CERVICAL 09/25/2009     Problem List Items Addressed This Visit       Other   Human immunodeficiency virus (HIV) disease (HCC)   Relevant Medications   Darunavir-Cobicistat-Emtricitabine-Tenofovir Alafenamide (SYMTUZA) 800-150-200-10 MG TABS   Other Relevant Orders   COMPLETE METABOLIC PANEL WITH GFR   CBC with Differential/Platelet   HIV-1 RNA quant-no reflex-bld   T-helper cell (CD4)- ([facility] clinic only)   Other Visit Diagnoses     Pharmacologic therapy    -  Primary   Relevant Orders   Lipid panel        I am having Wynton L. Dicicco maintain his acetaminophen and Symtuza.   Meds ordered this encounter  Medications   Darunavir-Cobicistat-Emtricitabine-Tenofovir Alafenamide (SYMTUZA) 800-150-200-10 MG TABS    Sig: Take 1 tablet by mouth daily with breakfast.    Dispense:  30 tablet    Refill:  5    Please call the office to schedule [phone]    Order Specific Question:    Supervising Provider    Answer:   Zenaida Niece DAM, CORNELIUS N [[id number]]     Follow-up: Return in about 4 months (around 08/28/2023) for B20.

## 2023-04-27 NOTE — Patient Instructions (Addendum)
Dental referral-call margaret, number below Food bags provided today Bus passes x 4 Refilled symtuza x 6 months Follow up in 4 months Labs today    Call [place] with dental to schedule [phone]

## 2023-04-28 ENCOUNTER — Telehealth: Payer: Self-pay

## 2023-04-28 LAB — T-HELPER CELL (CD4) - (RCID CLINIC ONLY)
CD4 % Helper T Cell: 37 % (ref 33–65)
CD4 T Cell Abs: 677 /uL (ref 400–1790)

## 2023-04-28 NOTE — Telephone Encounter (Signed)
-----   Message from Horton Finer, [place] sent at 04/28/2023  8:31 AM EDT ----- Please notify Gary [name] he appears to be anemic for several months.  I would recommend a colonoscopy soon. Please inquire if he has had any bright red blood or dark colored stool. All remaining labs look excellent.

## 2023-04-28 NOTE — Telephone Encounter (Signed)
Called patient to discuss provider's message, no answer. Left HIPAA compliant voicemail requesting callback.   Keyonte Cookston D Gopal Malter, RN  

## 2023-04-29 LAB — CBC WITH DIFFERENTIAL/PLATELET
Absolute Monocytes: 402 cells/uL (ref 200–950)
Basophils Absolute: 29 cells/uL (ref 0–200)
Basophils Relative: 0.6 %
Eosinophils Absolute: 142 cells/uL (ref 15–500)
Eosinophils Relative: 2.9 %
HCT: 41 % (ref 38.5–50.0)
Hemoglobin: 12.4 g/dL — ABNORMAL LOW (ref 13.2–17.1)
Lymphs Abs: 2078 cells/uL (ref 850–3900)
MCH: 22.9 pg — ABNORMAL LOW (ref 27.0–33.0)
MCHC: 30.2 g/dL — ABNORMAL LOW (ref 32.0–36.0)
MCV: 75.6 fL — ABNORMAL LOW (ref 80.0–100.0)
MPV: 11.4 fL (ref 7.5–12.5)
Monocytes Relative: 8.2 %
Neutro Abs: 2249 cells/uL (ref 1500–7800)
Neutrophils Relative %: 45.9 %
Platelets: 338 10*3/uL (ref 140–400)
RBC: 5.42 10*6/uL (ref 4.20–5.80)
RDW: 14.4 % (ref 11.0–15.0)
Total Lymphocyte: 42.4 %
WBC: 4.9 10*3/uL (ref 3.8–10.8)

## 2023-04-29 LAB — COMPLETE METABOLIC PANEL WITH GFR
AG Ratio: 1.1 (calc) (ref 1.0–2.5)
ALT: 40 U/L (ref 9–46)
AST: 22 U/L (ref 10–40)
Albumin: 4 g/dL (ref 3.6–5.1)
Alkaline phosphatase (APISO): 80 U/L (ref 36–130)
BUN: 19 mg/dL (ref 7–25)
CO2: 26 mmol/L (ref 20–32)
Calcium: 9.2 mg/dL (ref 8.6–10.3)
Chloride: 103 mmol/L (ref 98–110)
Creat: 0.93 mg/dL (ref 0.60–1.29)
Globulin: 3.5 g/dL (calc) (ref 1.9–3.7)
Glucose, Bld: 91 mg/dL (ref 65–99)
Potassium: 4.6 mmol/L (ref 3.5–5.3)
Sodium: 137 mmol/L (ref 135–146)
Total Bilirubin: 0.4 mg/dL (ref 0.2–1.2)
Total Protein: 7.5 g/dL (ref 6.1–8.1)
eGFR: 101 mL/min/{1.73_m2} (ref >=60)

## 2023-04-29 LAB — LIPID PANEL
Cholesterol: 187 mg/dL (ref <200)
HDL: 45 mg/dL (ref >=40)
LDL Cholesterol (Calc): 127 mg/dL (calc) — ABNORMAL HIGH
Non-HDL Cholesterol (Calc): 142 mg/dL (calc) — ABNORMAL HIGH (ref <130)
Total CHOL/HDL Ratio: 4.2 (calc) (ref <5.0)
Triglycerides: 63 mg/dL (ref <150)

## 2023-04-29 LAB — HIV-1 RNA QUANT-NO REFLEX-BLD
HIV 1 RNA Quant: <20 Copies/mL — ABNORMAL HIGH
HIV-1 RNA Quant, Log: <1.3 Log cps/mL — ABNORMAL HIGH

## 2023-04-29 NOTE — Telephone Encounter (Signed)
Patient returned call. Patient stated that he has been anemic for several years and he hasn't noticed bright red blood or dark colored stools. Patient doesn't have a PCP. Do you want to refer him to GI for a colonoscopy?   Daana Petrasek Lesli Albee, CMA

## 2023-05-02 ENCOUNTER — Other Ambulatory Visit: Payer: Self-pay | Admitting: Physician Assistant

## 2023-05-02 DIAGNOSIS — D649 Anemia, unspecified: Secondary | ICD-10-CM

## 2023-05-02 DIAGNOSIS — Z1211 Encounter for screening for malignant neoplasm of colon: Secondary | ICD-10-CM

## 2023-05-02 NOTE — Progress Notes (Signed)
Placed order for coloscopy.

## 2023-05-17 ENCOUNTER — Encounter: Payer: Self-pay | Admitting: Nurse Practitioner

## 2023-06-02 NOTE — Progress Notes (Signed)
The 10-year ASCVD risk score (Arnett DK, et al., 2019) is: 6.9%   Values used to calculate the score:     Age: 52 years     Sex: Male     Is Non-Hispanic African American: Yes     Diabetic: No     Tobacco smoker: Yes     Systolic Blood Pressure: 112 mmHg     Is BP treated: No     HDL Cholesterol: 45 mg/dL     Total Cholesterol: 187 mg/dL  Sandie Ano, RN

## 2023-08-04 ENCOUNTER — Ambulatory Visit (INDEPENDENT_AMBULATORY_CARE_PROVIDER_SITE_OTHER): Payer: Medicare HMO | Admitting: Nurse Practitioner

## 2023-08-04 ENCOUNTER — Encounter: Payer: Self-pay | Admitting: Nurse Practitioner

## 2023-08-04 VITALS — BP 118/62 | HR 76 | Ht 68.0 in | Wt 133.0 lb

## 2023-08-04 DIAGNOSIS — D509 Iron deficiency anemia, unspecified: Secondary | ICD-10-CM | POA: Diagnosis not present

## 2023-08-04 DIAGNOSIS — B20 Human immunodeficiency virus [HIV] disease: Secondary | ICD-10-CM | POA: Diagnosis not present

## 2023-08-04 DIAGNOSIS — R634 Abnormal weight loss: Secondary | ICD-10-CM

## 2023-08-04 MED ORDER — CLENPIQ 10-3.5-12 MG-GM -GM/160ML PO SOLN: 1.0000 | ORAL | 0 refills | Status: DC

## 2023-08-04 NOTE — Patient Instructions (Addendum)
You have been scheduled for an endoscopy and colonoscopy. Please follow the written instructions given to you at your visit today. Please pick up your prep supplies at the pharmacy within the next 1-3 days. If you use inhalers (even only as needed), please bring them with you on the day of your procedure.  Your provider has requested that you go to the basement level for lab work before leaving today. Press "B" on the elevator. The lab is located at the first door on the left as you exit the elevator.  Due to recent changes in healthcare laws, you may see the results of your imaging and laboratory studies on MyChart before your provider has had a chance to review them.  We understand that in some cases there may be results that are confusing or concerning to you. Not all laboratory results come back in the same time frame and the provider may be waiting for multiple results in order to interpret others.  Please give [place] 48 hours in order for your provider to thoroughly review all the results before contacting the office for clarification of your results.   Thank you for trusting me with your gastrointestinal care!   Carl Best, CRNP

## 2023-08-04 NOTE — Progress Notes (Signed)
08/04/2023 AB ELLERBE [id number] 1971-11-24   CHIEF COMPLAINT: Anemia   HISTORY OF PRESENT ILLNESS: Gary [name] is a 52 year old male with a past medical history of anxiety, bipolar disorder, hypertension and HIV initially diagnosed 20 years ago. Past cervical fusion x 2.  He presents to our office today as referred by Arvilla Meres PA-C for further evaluation regarding microcytic anemia.  He has a history of HIV on Symtuza followed by ID every 3 months.  Laboratory studies over the past year have indicated microcytic anemia.  He denies having any heartburn or dysphagia. He sometimes has mild generalized gas discomfort but no frequent abdominal pain.  His bowel pattern varies.  He passes brown loose to solid stools most days.  No rectal bleeding or black stools.  No NSAID use.  He denies ever undergoing a screening colonoscopy.  No fevers.  He stated having night sweats "forever".  He has unintentionally lost 16 pounds for the past 5 months.  Stated his mother told him he had anemia as a [name].  No known family history of anemia or GI cancer.  Labs 04/27/2023 showed hemoglobin level of 12.4.  Hematocrit 41.0.  MCV 75.6.  Platelets 338.  HIV RNA < 20.  CD4 count 677.     Latest Ref Rng & Units 04/27/2023   10:17 AM 02/21/2023    8:25 PM 01/07/2023   10:02 AM  CBC  WBC 3.8 - 10.8 Thousand/uL 4.9  5.3  6.0   Hemoglobin 13.2 - 17.1 g/dL 16.0  10.9  32.3   Hematocrit 38.5 - 50.0 % 41.0  43.0  42.4   Platelets 140 - 400 Thousand/uL 338  297  317         Latest Ref Rng & Units 04/27/2023   10:17 AM 02/21/2023    8:25 PM 01/07/2023   10:02 AM  CMP  Glucose 65 - 99 mg/dL 91  99  89   BUN 7 - 25 mg/dL 19  8  22    Creatinine 0.60 - 1.29 mg/dL 5.57  3.22  0.25   Sodium 135 - 146 mmol/L 137  136  138   Potassium 3.5 - 5.3 mmol/L 4.6  4.0  4.5   Chloride 98 - 110 mmol/L 103  106  106   CO2 20 - 32 mmol/L 26  22  26    Calcium 8.6 - 10.3 mg/dL 9.2  8.9  9.6   Total Protein 6.1 - 8.1 g/dL  7.5  8.5  7.6   Total Bilirubin 0.2 - 1.2 mg/dL 0.4  0.6  0.4   Alkaline Phos 38 - 126 U/L  69    AST 10 - 40 U/L 22  23  54   ALT 9 - 46 U/L 40  27  120     Past Medical History:  Diagnosis Date   Bipolar disorder (HCC)    HIV (human immunodeficiency virus infection) (HCC)    HIV (human immunodeficiency virus infection) (HCC)    Hypertension    Immune deficiency disorder (HCC)    Past Surgical History:  Procedure Laterality Date   AMPUTATION  11/04/2011   Procedure: AMPUTATION DIGIT;  Surgeon: Cammy Copa;  Location: [facility] OR;  Service: Orthopedics;  Laterality: Right;  Right Second Toe Distal Phalanx Amputation   BUNIONECTOMY     CERVICAL FUSION     Social History: He is separated.  Previously homeless, resides in an apartment for the past 4 months.  He  vapes daily.  He smokes marijuana daily since the age of 78 or 89.  No other drug use.  He drinks 2 beers within 2 weeks.    Family History: Father and brother with history of diabetes.  No known family history of esophageal, gastric or colorectal cancer.  Allergies  Allergen Reactions   Prunus Persica Hives    Only fresh D/T THE FUZZ   Tivicay [Dolutegravir] Rash   Gabapentin    Nsaids Itching and Rash   Tramadol Hcl Itching and Rash    Can take Hydrocodone, Oxycodone      Outpatient Encounter Medications as of 08/04/2023  Medication Sig   acetaminophen (TYLENOL) 500 MG tablet Take 1,000 mg by mouth 3 (three) times daily as needed for pain. For pain   Darunavir-Cobicistat-Emtricitabine-Tenofovir Alafenamide (SYMTUZA) 800-150-200-10 MG TABS Take 1 tablet by mouth daily with breakfast.   No facility-administered encounter medications on file as of 08/04/2023.   REVIEW OF SYSTEMS:  Gen: See HPI. CV: Denies chest pain, palpitations or edema. Resp: Denies cough, shortness of breath of hemoptysis.  GI: See HPI. GU: Denies urinary burning, blood in urine, increased urinary frequency or incontinence. MS: Denies joint  pain, muscles aches or weakness. Derm: Denies rash, itchiness, skin lesions or unhealing ulcers. Psych: + Anxiety.  Heme: Denies bruising, easy bleeding. Neuro:  Denies headaches, dizziness or paresthesias. Endo:  Denies any problems with DM, thyroid or adrenal function.  PHYSICAL EXAM: BP 118/62   Pulse 76   Ht 5\' 8"  (1.727 m)   Wt 133 lb (60.3 kg)   BMI 20.22 kg/m   Wt Readings from Last 3 Encounters:  08/04/23 133 lb (60.3 kg)  04/27/23 136 lb 6.4 oz (61.9 kg)  02/21/23 149 lb (67.6 kg)    General: Thin 52 year old male in no acute distress. Head: Normocephalic and atraumatic. Eyes:  Sclerae non-icteric, conjunctive pink. Ears: Normal auditory acuity. Mouth: Poor dentition.  No ulcers or lesions.  Neck: Supple, enlarged nontender lymph node to the lower left cervical area. Lungs: Clear bilaterally to auscultation without wheezes, crackles or rhonchi. Heart: Regular rate and rhythm. No murmur, rub or gallop appreciated.  Abdomen: Soft, nontender, nondistended. No masses. No hepatosplenomegaly. Normoactive bowel sounds x 4 quadrants.  Rectal: Deferred. Musculoskeletal: Symmetrical with no gross deformities. Skin: Warm and dry. No rash or lesions on visible extremities. Extremities: No edema. Neurological: Alert oriented x 4, no focal deficits.  Psychological:  Alert and cooperative. Normal mood and affect.  ASSESSMENT AND PLAN:  52 year old male with microcytic anemia.  No overt GI bleeding.  Never had a screening colonoscopy. -CBC, CMP and IBC + Ferritin level  -EGD and colonoscopy benefits and risks discussed including risk with sedation, risk of bleeding, perforation and infection  -Further recommendations to be determined after the above evaluation completed  Unintentional weight loss.  Patient has lost 16 pounds since March 2024. -Recommend chest/abdominal/pelvic CT if the above evaluation unrevealing and/or if weight loss persists   HIV positive on Symtuza  Left  lower cervical lymphadenopathy, patient stated is chronic and followed by ID. -Continue follow-up with ID, may require biopsy   CC:  Horton Finer, PA-C

## 2023-08-08 ENCOUNTER — Telehealth: Payer: Self-pay

## 2023-08-08 DIAGNOSIS — R634 Abnormal weight loss: Secondary | ICD-10-CM

## 2023-08-08 DIAGNOSIS — B20 Human immunodeficiency virus [HIV] disease: Secondary | ICD-10-CM

## 2023-08-08 DIAGNOSIS — D509 Iron deficiency anemia, unspecified: Secondary | ICD-10-CM

## 2023-08-08 NOTE — Telephone Encounter (Signed)
I have called patient and lvm to call back and reschedule procedures due to change in providers schedule.

## 2023-08-18 ENCOUNTER — Other Ambulatory Visit (INDEPENDENT_AMBULATORY_CARE_PROVIDER_SITE_OTHER): Payer: Medicare HMO

## 2023-08-18 DIAGNOSIS — B20 Human immunodeficiency virus [HIV] disease: Secondary | ICD-10-CM

## 2023-08-18 DIAGNOSIS — D509 Iron deficiency anemia, unspecified: Secondary | ICD-10-CM

## 2023-08-18 DIAGNOSIS — R634 Abnormal weight loss: Secondary | ICD-10-CM

## 2023-08-18 LAB — COMPREHENSIVE METABOLIC PANEL
ALT: 23 U/L (ref 0–53)
AST: 16 U/L (ref 0–37)
Albumin: 4.1 g/dL (ref 3.5–5.2)
Alkaline Phosphatase: 72 U/L (ref 39–117)
BUN: 10 mg/dL (ref 6–23)
CO2: 28 mEq/L (ref 19–32)
Calcium: 9.7 mg/dL (ref 8.4–10.5)
Chloride: 102 mEq/L (ref 96–112)
Creatinine, Ser: 0.96 mg/dL (ref 0.40–1.50)
GFR: 92.36 mL/min (ref >60.00)
Glucose, Bld: 135 mg/dL — ABNORMAL HIGH (ref 70–99)
Potassium: 4.2 mEq/L (ref 3.5–5.1)
Sodium: 138 mEq/L (ref 135–145)
Total Bilirubin: 0.7 mg/dL (ref 0.2–1.2)
Total Protein: 7.5 g/dL (ref 6.0–8.3)

## 2023-08-18 LAB — CBC WITH DIFFERENTIAL/PLATELET
Basophils Absolute: 0.1 10*3/uL (ref 0.0–0.1)
Basophils Relative: 1.1 % (ref 0.0–3.0)
Eosinophils Absolute: 0.3 10*3/uL (ref 0.0–0.7)
Eosinophils Relative: 5.6 % — ABNORMAL HIGH (ref 0.0–5.0)
HCT: 43.2 % (ref 39.0–52.0)
Hemoglobin: 13.5 g/dL (ref 13.0–17.0)
Lymphocytes Relative: 42 % (ref 12.0–46.0)
Lymphs Abs: 2 10*3/uL (ref 0.7–4.0)
MCHC: 31.2 g/dL (ref 30.0–36.0)
MCV: 76.5 fl — ABNORMAL LOW (ref 78.0–100.0)
Monocytes Absolute: 0.3 10*3/uL (ref 0.1–1.0)
Monocytes Relative: 7.2 % (ref 3.0–12.0)
Neutro Abs: 2.1 10*3/uL (ref 1.4–7.7)
Neutrophils Relative %: 44.1 % (ref 43.0–77.0)
Platelets: 254 10*3/uL (ref 150.0–400.0)
RBC: 5.65 Mil/uL (ref 4.22–5.81)
RDW: 15.4 % (ref 11.5–15.5)
WBC: 4.8 10*3/uL (ref 4.0–10.5)

## 2023-08-18 LAB — IBC + FERRITIN
Ferritin: 68.5 ng/mL (ref 22.0–322.0)
Iron: 153 ug/dL (ref 42–165)
Saturation Ratios: 51.8 % — ABNORMAL HIGH (ref 20.0–50.0)
TIBC: 295.4 ug/dL (ref 250.0–450.0)
Transferrin: 211 mg/dL — ABNORMAL LOW (ref 212.0–360.0)

## 2023-08-24 ENCOUNTER — Telehealth: Payer: Self-pay

## 2023-08-24 ENCOUNTER — Ambulatory Visit: Payer: Medicare HMO | Admitting: Physician Assistant

## 2023-08-24 NOTE — Telephone Encounter (Signed)
Contacted pt & LVM to return call regarding labs.

## 2023-08-29 NOTE — Telephone Encounter (Signed)
Patient rescheduled for procedure. Please update prep instructions 

## 2023-08-29 NOTE — Addendum Note (Signed)
Addended by: Rise Paganini on: 08/29/2023 01:47 PM   Modules accepted: Orders

## 2023-08-29 NOTE — Telephone Encounter (Signed)
Instructions were sent via Mychart & mail.

## 2023-09-01 NOTE — Progress Notes (Signed)
Agree with the assessment and plan as outlined by Alcide Evener, NP.   Agree with plan for EGD/colonoscopy, and labs as outlined.  Based on the degree of unintentional weight loss and his clinical history, agree that CT is reasonable at this time.   Doristine Locks, DO, [facility]

## 2023-09-02 ENCOUNTER — Telehealth: Payer: Self-pay

## 2023-09-02 ENCOUNTER — Other Ambulatory Visit: Payer: Self-pay

## 2023-09-02 DIAGNOSIS — R634 Abnormal weight loss: Secondary | ICD-10-CM

## 2023-09-02 NOTE — Progress Notes (Signed)
Kaira, pls contact patient and schedule him for an abdominal/pelvic CT scan with oral and IV contrast due to unexplained weight loss. See Dr. Frankey Shown addendum note.  Patient to proceed with EGD and colonoscopy as previously scheduled. THX.

## 2023-09-02 NOTE — Telephone Encounter (Signed)
Author: Arnaldo Natal, NP Service: Gastroenterology Author Type: Nurse Practitioner  Filed: 09/02/2023  3:50 PM Encounter Date: 08/04/2023 Status: Signed  Editor: Arnaldo Natal, NP (Nurse Practitioner)   Gl[place]ora Score, pls contact patient and schedule him for an abdominal/pelvic CT scan with oral and IV contrast due to unexplained weight loss. See Dr. Frankey Shown addendum note.  Patient to proceed with EGD and colonoscopy as previously scheduled. THX.

## 2023-09-02 NOTE — Telephone Encounter (Signed)
Left message for patient to call back. Order for CT placed.

## 2023-09-08 NOTE — Telephone Encounter (Signed)
Spoke with patient regarding NP & MD recommendations for CT scan. He is okay with scheduling. Sent to schedulers. Advised him that they would be reaching out within 1 week & if he has not heard back from them by then to give [place] a call. He also had a question about his prep & advised him it had already been sent to his [facility]. Pt verbalized all understanding.

## 2023-09-14 ENCOUNTER — Telehealth: Payer: Self-pay | Admitting: Gastroenterology

## 2023-09-14 MED ORDER — CLENPIQ 10-3.5-12 MG-GM -GM/160ML PO SOLN: 1.0000 | ORAL | 0 refills | Status: DC

## 2023-09-14 NOTE — Telephone Encounter (Signed)
Inbound call from patient stating he has not received prep medication at pharmacy. Patient requesting a call back. Please advise, thank you.

## 2023-09-14 NOTE — Telephone Encounter (Signed)
Contacted pt & prep was resent to [facility] on [address]

## 2023-09-14 NOTE — Telephone Encounter (Signed)
Contacted pt & LVM to return call.

## 2023-09-22 ENCOUNTER — Encounter: Payer: Medicare HMO | Admitting: Gastroenterology

## 2023-10-05 ENCOUNTER — Encounter: Payer: Self-pay | Admitting: Gastroenterology

## 2023-10-19 ENCOUNTER — Encounter: Payer: Medicare HMO | Admitting: Gastroenterology

## 2023-10-19 ENCOUNTER — Telehealth: Payer: Self-pay | Admitting: Gastroenterology

## 2023-10-19 NOTE — Telephone Encounter (Signed)
Good Morning Dr. Barron Alvine,  I called this patient yesterday, he answered NO to the Automated reminder and asked him to call back to confirm cancellation of procedure.  He never called back,. I called today at 10:25 am to see if he was coming in today for procedure however he said he did not have a care partner.  I will NO SHOW patient- Medicare

## 2023-12-16 ENCOUNTER — Telehealth: Payer: Self-pay

## 2023-12-16 DIAGNOSIS — B20 Human immunodeficiency virus [HIV] disease: Secondary | ICD-10-CM

## 2023-12-16 MED ORDER — SYMTUZA 800-150-200-10 MG PO TABS: 1.0000 | ORAL_TABLET | Freq: Every day | ORAL | 0 refills | Status: DC

## 2023-12-16 NOTE — Telephone Encounter (Signed)
Called patient regarding refill request for Symtuza. Is overdue for appointment and will need to schedule follow up. Left voicemail requesting call back. Will send in 30 day supply.   Gary [name], RMA

## 2024-01-17 ENCOUNTER — Other Ambulatory Visit: Payer: Self-pay

## 2024-01-17 DIAGNOSIS — B20 Human immunodeficiency virus [HIV] disease: Secondary | ICD-10-CM

## 2024-01-17 MED ORDER — SYMTUZA 800-150-200-10 MG PO TABS: 1.0000 | ORAL_TABLET | Freq: Every day | ORAL | 0 refills | Status: DC

## 2024-02-16 ENCOUNTER — Telehealth: Payer: Self-pay

## 2024-02-16 NOTE — Telephone Encounter (Signed)
 Debria Garret, NP with [facility] called requesting fax number to fax over lab results - Liver Enzymes are elevated and she is concerned it is due to symtuza. NP has been unable to get in contact with patient.    I attempted to contact patient also - call couldn't be completed.    Asalee Barrette Lesli Albee, CMA

## 2024-02-22 ENCOUNTER — Ambulatory Visit: Admitting: Physician Assistant

## 2024-02-22 ENCOUNTER — Encounter: Payer: Self-pay | Admitting: Physician Assistant

## 2024-02-23 ENCOUNTER — Other Ambulatory Visit: Payer: Self-pay

## 2024-02-23 ENCOUNTER — Encounter: Payer: Self-pay | Admitting: Infectious Diseases

## 2024-02-23 ENCOUNTER — Ambulatory Visit (INDEPENDENT_AMBULATORY_CARE_PROVIDER_SITE_OTHER): Admitting: Infectious Diseases

## 2024-02-23 VITALS — BP 112/76 | HR 77 | Resp 16 | Ht 68.0 in | Wt 157.0 lb

## 2024-02-23 DIAGNOSIS — B20 Human immunodeficiency virus [HIV] disease: Secondary | ICD-10-CM | POA: Diagnosis not present

## 2024-02-23 DIAGNOSIS — R7401 Elevation of levels of liver transaminase levels: Secondary | ICD-10-CM

## 2024-02-23 DIAGNOSIS — Z23 Encounter for immunization: Secondary | ICD-10-CM | POA: Diagnosis not present

## 2024-02-23 MED ORDER — BICTEGRAVIR-EMTRICITAB-TENOFOV 50-200-25 MG PO TABS: 1.0000 | ORAL_TABLET | Freq: Every day | ORAL | 5 refills | Status: DC

## 2024-02-23 NOTE — Patient Instructions (Addendum)
 [facility]  Address: [address] [place], [place], [place] [address] Phone: [phone]  [facility]  Address: [address] [place], [place] [address] Phone: [phone]  Family Services    STOP Symtuza (there is an interaction with the Seroquel that makes it more likely to experience side effects.  You may need a bit of a higher dose of seroquel - if you notice any changes in how you feel or return of some of the auditory hallucinations please contact your mental health team   START Biktarvy once a day  - Common side effects for a short time frame usually include headaches, nausea and diarrhea - OK to take over the counter tylenol for headaches and imodium for diarrhea - Try taking with food if you are nauseated  - If you take any multivitamins or supplements please separate them from your Biktarvy by 6 hours before and after.  The main thing is do not have them in the stomach at the same time.   Would like to see you back in 1-2 months to follow up on your medication change

## 2024-02-23 NOTE — Progress Notes (Signed)
 Name: Gary [name]  DOB: [birth year] MRN: [id number] PCP: Pcp, No    Brief Narrative:  Gary [name] is a 53 y.o. male with HIV disease, Dx ~2000.  CD4 nadir unknown - never had to take any antibiotics VL unknown HIV Risk: heterosexual. Wife HIV +  History of OIs: unknown Intake Labs 2012: Hep B sAg (-), sAb (+), cAb (not done); Hep A (-), Hep C (-) Quantiferon (-) HLA B*5701 (-) G6PD: ()  Previous Regimens: Evotaz + Descovy  Symtuza 2020  Genotypes: None on file. No mentioning of mutations in previous note   Subjective   Subjective:  No chief complaint on file.    Discussed the use of AI scribe software for clinical note transcription with the patient, who gave verbal consent to proceed.  History of Present Illness   Gary [name] is a 53 year old male who presents for follow-up and return to care for HIV.   He has been taking Symtuza without complications and is due for a refill. He recently started Seroquel at a dose of 25 mg three times a day for mood swings and hallucinations for which he had a care encouner for a few weeks ago. His recent viral load was 75, indicating good control of his HIV infection.  He acknowledges a history of cocaine use, which he smokes, and wants to quit. He has not used injections or snorted cocaine. He does use this as a means of self medication and is considering a residential program to help quic.   Recently A1C in prediabetes rane at 5.9% He admits to consuming a large amount of candy recently, which may contribute to elevated blood sugar levels. His cholesterol is slightly elevated, and liver function tests were a bit high. He drinks alcohol occasionally but does not binge drink.   He resides in [place] and continues to use Medicaid for insurance. He receives his medications from a pharmacy on [address].          02/23/2024    2:28 PM  Depression screen PHQ 2/9  Decreased Interest 0  Down, Depressed, Hopeless 1  PHQ -  2 Score 1    Review of Systems  Constitutional:  Negative for chills and fever.  HENT:  Negative for tinnitus.   Eyes:  Negative for blurred vision and photophobia.  Respiratory:  Negative for cough and sputum production.   Cardiovascular:  Negative for chest pain.  Gastrointestinal:  Negative for diarrhea, nausea and vomiting.  Genitourinary:  Negative for dysuria.  Skin:  Negative for rash.  Neurological:  Negative for headaches.     Past Medical History:  Diagnosis Date   Bipolar disorder (HCC)    HIV (human immunodeficiency virus infection) (HCC)    HIV (human immunodeficiency virus infection) (HCC)    Hypertension    Immune deficiency disorder (HCC)     Outpatient Medications Prior to Visit  Medication Sig Dispense Refill   acetaminophen (TYLENOL) 500 MG tablet Take 1,000 mg by mouth 3 (three) times daily as needed for pain. For pain     QUEtiapine (SEROQUEL) 25 MG tablet Take 1 tablet by mouth daily.     Darunavir-Cobicistat-Emtricitabine-Tenofovir Alafenamide (SYMTUZA) 800-150-200-10 MG TABS Take 1 tablet by mouth daily with breakfast. 30 tablet 0   Sod Picosulfate-Mag Ox-Cit Acd (CLENPIQ) 10-3.5-12 MG-GM -GM/160ML SOLN Take 1 kit by mouth as directed. 320 mL 0   Sod Picosulfate-Mag Ox-Cit Acd (CLENPIQ) 10-3.5-12 MG-GM -GM/160ML SOLN Take 1 kit by mouth as directed.  320 mL 0   No facility-administered medications prior to visit.     Allergies  Allergen Reactions   Prunus Persica Hives    Only fresh D/T THE FUZZ   Tivicay [Dolutegravir] Rash   Gabapentin    Nsaids Itching and Rash   Tramadol Hcl Itching and Rash    Can take Hydrocodone, Oxycodone    Social History   Tobacco Use   Smoking status: Every Day    Types: E-cigarettes   Smokeless tobacco: Never   Tobacco comments:    Not ready to quit vaping.   Vaping Use   Vaping status: Every Day   Substances: Nicotine, THC  Substance Use Topics   Alcohol use: Yes   Drug use: Yes    Frequency: 3.0 times  per week    Types: Marijuana    Comment: every day    Family History  Problem Relation Age of Onset   Diabetes Father    Diabetes Brother    Liver disease Neg Hx    Colon cancer Neg Hx    Esophageal cancer Neg Hx     Social History   Substance and Sexual Activity  Sexual Activity Yes   Partners: Female   Comment: pt declined condoms        Objective   Objective:   Vitals:   02/23/24 1425  BP: 112/76  Pulse: 77  Resp: 16  Weight: 157 lb (71.2 kg)  Height: 5\' 8"  (1.727 m)   Body mass index is 23.87 kg/m.  Physical Exam Vitals reviewed.  Constitutional:      Appearance: He is well-developed.  HENT:     Mouth/Throat:     Dentition: Normal dentition. No dental abscesses.  Cardiovascular:     Rate and Rhythm: Normal rate and regular rhythm.     Heart sounds: Normal heart sounds.  Pulmonary:     Effort: Pulmonary effort is normal.     Breath sounds: Normal breath sounds.  Abdominal:     General: There is no distension.     Palpations: Abdomen is soft.     Tenderness: There is no abdominal tenderness.  Lymphadenopathy:     Cervical: No cervical adenopathy.  Skin:    General: Skin is warm and dry.     Findings: No rash.  Neurological:     Mental Status: He is alert and oriented to person, place, and time.  Psychiatric:        Judgment: Judgment normal.           Assessment & Plan:     HIV - Symtuza managed HIV well but risked interaction with Seroquel. Biktarvy recommended for mental health compatibility and convenience. Old allergy 10 years ago with Tivicay and a rash - will see how he does with re-trial on bictegravir component.  - Switch from [place] to [employer] starting tomorrow.  - Monitor for side effects such as headaches and bowel changes. - Recheck immune system status with T cell count. - quantiferon gold today - Update vaccines today  - Screen for anal cancer if indicated and lipid discussion at next OV.   Cocaine use disorder  - Long-term cocaine use with motivation for cessation. Discussed treatment resources and program options. - Provide resources for substance use treatment, including [facility] and [facility]. - Encourage exploration of inpatient or residential programs for substance use treatment.  Anemia - Improved iron levels and blood count suggest resolution. Colonoscopy pending for further evaluation. - Consider future colonoscopy if anemia persists  or worsens. - Monitor iron levels and blood count.  Prediabetes - Hemoglobin A1c indicates prediabetes. High candy consumption noted as a contributing factor. - Encourage reduction of candy intake. - Suggest sugar-free or Stevia-sweetened candies as alternatives.  Elevated liver function tests - Slightly elevated liver function tests. Occasional alcohol use reported. Hepatitis rescreening planned. - Rescreen for hepatitis. - Monitor liver function tests.  General Health Maintenance - Due for immunizations against common infections. Agreed to flu and pneumonia vaccines. - Administer flu shot. - Administer pneumonia vaccine.  Follow-up - Watch mood and hallucinations closely after switch from symtuza to biktarvy - discussed he may need a up titration in Seroquel but this is best long term to avoid a direct interaction.       Orders Placed This Encounter  Procedures   T-helper cells (CD4) count   Hepatitis, Acute   QuantiFERON-TB Gold Plus    Meds ordered this encounter  Medications   bictegravir-emtricitabine-tenofovir AF (BIKTARVY) 50-200-25 MG TABS tablet    Sig: Take 1 tablet by mouth daily. Try to take at the same time each day with or without food.    Dispense:  30 tablet    Refill:  5    Return in about 1 month (around 03/25/2024).   Rexene Alberts, MSN, NP-C [facility]  [email or web address] Pager: [phone] Office: [phone] [facility] Main Line:  [phone] *Secure Chat Communication Welcome

## 2024-02-24 LAB — T-HELPER CELLS (CD4) COUNT (NOT AT ARMC)
CD4 % Helper T Cell: 37 % (ref 33–65)
CD4 T Cell Abs: 741 /uL (ref 400–1790)

## 2024-02-25 LAB — HEPATITIS PANEL, ACUTE
Hep A IgM: NONREACTIVE
Hep B C IgM: NONREACTIVE
Hepatitis B Surface Ag: NONREACTIVE
Hepatitis C Ab: NONREACTIVE

## 2024-02-25 LAB — QUANTIFERON-TB GOLD PLUS
Mitogen-NIL: 8.95 [IU]/mL
NIL: 0.04 [IU]/mL
QuantiFERON-TB Gold Plus: NEGATIVE
TB1-NIL: 0 [IU]/mL
TB2-NIL: 0 [IU]/mL

## 2024-03-13 ENCOUNTER — Ambulatory Visit (INDEPENDENT_AMBULATORY_CARE_PROVIDER_SITE_OTHER)

## 2024-03-13 ENCOUNTER — Ambulatory Visit (HOSPITAL_COMMUNITY)
Admission: EM | Admit: 2024-03-13 | Discharge: 2024-03-13 | Disposition: A | Discharge status: Home / Self Care | Attending: Family Medicine | Admitting: Family Medicine

## 2024-03-13 ENCOUNTER — Encounter (HOSPITAL_COMMUNITY): Payer: Self-pay | Admitting: *Deleted

## 2024-03-13 DIAGNOSIS — M79642 Pain in left hand: Secondary | ICD-10-CM

## 2024-03-13 DIAGNOSIS — M79641 Pain in right hand: Secondary | ICD-10-CM | POA: Diagnosis not present

## 2024-03-13 MED ORDER — PREDNISONE 20 MG PO TABS: 40.0000 mg | ORAL_TABLET | Freq: Every day | ORAL | 0 refills | Status: AC

## 2024-03-13 NOTE — ED Triage Notes (Signed)
 Pt states for a month he has been hitting bags with regular gloves not using padded gloves since he doesn't have any. He states he stops for a little for the swelling to go down but then starts punching again.

## 2024-03-13 NOTE — Discharge Instructions (Signed)
 By my review the x-rays are normal.  The radiologist will also read your x-ray, and if their interpretation differs significantly from mine, and the management of your condition would change, we will call you.  Take prednisone 20 mg--2 daily for 3 days; this is for inflammation and swelling in your hands.  You can also ice your hands  Please do not hit the bag anymore until you are hands are healed.  Also do not have the bag any until you have some form of padding to protect your hands from further injury.  1 suggestion is padded inner gloves for boxing there are less expensive than a full boxing glove.  We can also cut up foam from an old pillow and wrapping around her knuckles to prevent injury.

## 2024-03-13 NOTE — ED Provider Notes (Signed)
 [facility]    CSN: [id number] Arrival date & time: 03/13/24  1552      History   Chief Complaint Chief Complaint  Patient presents with   Hand Pain    HPI Gary [name] is a 53 y.o. male.    Hand Pain  Here for bilateral hand pain. He has been hitting a bag at the gym without gel pad gloves or boxing gloves. When he lays off the bag, the hands improve, and then will swell and hurt again after hitting the bag.   They are swollen and painful today. Tylenol not helping  [employer]. Allergic to NSAIDs, gabapentin, and tramadol.  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   Tetrahydrocannabinol (THC) use disorder, moderate, dependence (HCC) 04/05/2022   Lumbar disc disease with radiculopathy 09/03/2019   Hepatitis B immune 11/15/2017   Alcohol use disorder 05/29/2016   Cocaine dependence, uncomplicated (HCC) 05/29/2016   Tetrahydrocannabinol (THC) use disorder, mild, abuse 05/29/2016   Lymphadenopathy, supraclavicular 03/03/2016   Cervical radiculopathy 09/20/2013   Chronic pain syndrome 09/20/2013   Neck pain 09/20/2013   Smoking 10/06/2011   Encounter for long-term (current) use of other medications 04/19/2011   Human immunodeficiency virus (HIV) disease (HCC) 11/26/2009   HERPES ZOSTER, HX OF 11/26/2009   SICKLE-CELL TRAIT 09/25/2009   BIPOLAR DISORDER UNSPECIFIED 09/25/2009   ESSENTIAL HYPERTENSION, BENIGN 09/25/2009   DISC DISEASE, CERVICAL 09/25/2009   Sickle cell trait (HCC) 09/25/2009    Past Surgical History:  Procedure Laterality Date    AMPUTATION  11/04/2011   Procedure: AMPUTATION DIGIT;  Surgeon: Cammy Copa;  Location: [facility] OR;  Service: Orthopedics;  Laterality: Right;  Right Second Toe Distal Phalanx Amputation   BUNIONECTOMY     CERVICAL FUSION         Home Medications    Prior to Admission medications   Medication Sig Start Date End Date Taking? Authorizing Provider  acetaminophen (TYLENOL) 500 MG tablet Take 1,000 mg by mouth 3 (three) times daily as needed for pain. For pain   Yes [provider]  bictegravir-emtricitabine-tenofovir AF (BIKTARVY) 50-200-25 MG TABS tablet Take 1 tablet by mouth daily. Try to take at the same time each day with or without food. 02/23/24  Yes Blanchard Kelch, NP  predniSONE (DELTASONE) 20 MG tablet Take 2 tablets (40 mg total) by mouth daily with breakfast for 3 days. 03/13/24 03/16/24 Yes Zenia Resides, MD  QUEtiapine (SEROQUEL) 25 MG tablet Take 1 tablet by mouth daily. 02/20/24 03/22/24 Yes [provider]    Family History Family History  Problem Relation Age of Onset   Diabetes Father    Diabetes Brother    Liver disease Neg Hx    Colon cancer Neg Hx    Esophageal cancer Neg Hx     Social History Social History   Tobacco Use   Smoking status: Every Day    Types: E-cigarettes   Smokeless tobacco: Never   Tobacco comments:    Not ready to quit vaping.   Vaping Use   Vaping status: Every Day   Substances: Nicotine,  THC  Substance Use Topics   Alcohol use: Yes   Drug use: Yes    Frequency: 3.0 times per week    Types: Marijuana    Comment: every day     Allergies   Prunus persica, Tivicay [dolutegravir], Gabapentin, Nsaids, and Tramadol hcl   Review of Systems Review of Systems   Physical Exam Triage Vital Signs ED Triage Vitals  Encounter Vitals Group     BP 03/13/24 1616 125/78     Systolic BP Percentile --      Diastolic BP Percentile --      Pulse Rate 03/13/24 1616 64     Resp 03/13/24 1616 18     Temp 03/13/24  1616 98.4 F (36.9 C)     Temp Source 03/13/24 1616 Oral     SpO2 03/13/24 1616 95 %     Weight --      Height --      Head Circumference --      Peak Flow --      Pain Score 03/13/24 1615 9     Pain Loc --      Pain Education --      Exclude from Growth Chart --    No data found.  Updated Vital Signs BP 125/78 (BP Location: Left Arm)   Pulse 64   Temp 98.4 F (36.9 C) (Oral)   Resp 18   SpO2 95%   Visual Acuity Right Eye Distance:   Left Eye Distance:   Bilateral Distance:    Right Eye Near:   Left Eye Near:    Bilateral Near:     Physical Exam Vitals reviewed.  Constitutional:      General: He is not in acute distress.    Appearance: He is not ill-appearing, toxic-appearing or diaphoretic.  Musculoskeletal:     Comments: There is swelling and tenderness of the 2nd, 3rd, and 4th MCP joints of both hands.  Skin:    Coloration: Skin is not jaundiced or pale.  Neurological:     Mental Status: He is alert and oriented to person, place, and time.  Psychiatric:        Behavior: Behavior normal.      UC Treatments / Results  Labs (all labs ordered are listed, but only abnormal results are displayed) Labs Reviewed - No data to display  EKG   Radiology No results found.  Procedures Procedures (including critical care time)  Medications Ordered in UC Medications - No data to display  Initial Impression / Assessment and Plan / UC Course  I have reviewed the triage vital signs and the nursing notes.  Pertinent labs & imaging results that were available during my care of the patient were reviewed by me and considered in my medical decision making (see chart for details).     By my review there are no fractures on the x-rays.  He is advised of radiology overread.  I have made suggestions for an expensive alternatives to full boxing gloves, including sterile glove inserts.  Also I have suggested if he cannot buy something and store, he could cut up an old  pillow and wrapped around his knuckles.  He is not to use the bag until his hands feel better and he has some sort of padding to use.  Prednisone sent in for 3 days Final Clinical Impressions(s) / UC Diagnoses   Final diagnoses:  Bilateral hand pain     Discharge Instructions      By  my review the x-rays are normal.  The radiologist will also read your x-ray, and if their interpretation differs significantly from mine, and the management of your condition would change, we will call you.  Take prednisone 20 mg--2 daily for 3 days; this is for inflammation and swelling in your hands.  You can also ice your hands  Please do not hit the bag anymore until you are hands are healed.  Also do not have the bag any until you have some form of padding to protect your hands from further injury.  1 suggestion is padded inner gloves for boxing there are less expensive than a full boxing glove.  We can also cut up foam from an old pillow and wrapping around her knuckles to prevent injury.            ED Prescriptions     Medication Sig Dispense Auth. Provider   predniSONE (DELTASONE) 20 MG tablet Take 2 tablets (40 mg total) by mouth daily with breakfast for 3 days. 6 tablet Brandy Kabat, Janace Aris, MD      I have reviewed the PDMP during this encounter.   Zenia Resides, MD 03/13/24 [phone]

## 2024-03-26 ENCOUNTER — Other Ambulatory Visit: Payer: Self-pay

## 2024-03-26 ENCOUNTER — Encounter: Payer: Self-pay | Admitting: Infectious Diseases

## 2024-03-26 ENCOUNTER — Ambulatory Visit (INDEPENDENT_AMBULATORY_CARE_PROVIDER_SITE_OTHER): Admitting: Infectious Diseases

## 2024-03-26 VITALS — BP 129/81 | HR 62 | Temp 98.0°F | Wt 155.0 lb

## 2024-03-26 DIAGNOSIS — B20 Human immunodeficiency virus [HIV] disease: Secondary | ICD-10-CM | POA: Diagnosis not present

## 2024-03-26 MED ORDER — TIZANIDINE HCL 2 MG PO TABS: ORAL_TABLET | ORAL | 0 refills | Status: DC

## 2024-03-26 MED ORDER — TIZANIDINE HCL 2 MG PO CAPS: 2.0000 mg | ORAL_CAPSULE | Freq: Every day | ORAL | 0 refills | Status: DC | PRN

## 2024-03-26 MED ORDER — BICTEGRAVIR-EMTRICITAB-TENOFOV 50-200-25 MG PO TABS: 1.0000 | ORAL_TABLET | Freq: Every day | ORAL | 5 refills | Status: DC

## 2024-03-26 NOTE — Progress Notes (Signed)
 Name: Gary [name]  DOB: November 24, 1971 MRN: [id number] PCP: Pcp, No    Brief Narrative:  Gary [name] is a 53 y.o. male with HIV disease, Dx ~2000.  CD4 nadir unknown - never had to take any antibiotics VL unknown HIV Risk: heterosexual. Wife HIV +  History of OIs: unknown Intake Labs 2012: Hep B sAg (-), sAb (+), cAb (not done); Hep A (-), Hep C (-) Quantiferon (-) HLA B*5701 (-) G6PD: ()  Previous Regimens: Evotaz + Descovy  Symtuza 2020 Biktarvy   Genotypes: None on file. No mentioning of mutations in previous note   Subjective   Subjective:   Chief Complaint  Patient presents with   Follow-up     Discussed the use of AI scribe software for clinical note transcription with the patient, who gave verbal consent to proceed.  History of Present Illness   Gary [name] is here for follow-up after switching to [place].  He is currently on Biktarvy for HIV management and has no concerns or side effects with the new medication. He appreciates the smaller size of the pill and the convenience of not needing to take it with food. He is awaiting lab results to ensure the medication is effective.  He experiences chronic low back pain, exacerbated by daily workouts, particularly exercises like squats and leg presses. He has a history of back issues and uses tizanidine 2-4 4 mg at night to avoid daytime drowsiness, sometimes taking half a tablet as needed. He would like to consider having some on hand to help until he gets to pain management.   He has a history of substance use, including cocaine and marijuana vape, which he has stopped. He continues to use a nicotine vape but plans to quit after his current supply runs out. He has been diagnosed with emphysema, which motivates him to quit smoking.  He needs therapy to manage anger and agitation, which have increased during his transition away from substance use. He is considering therapy options and mentions that [address] offers  therapy services. He is currently on Seroquel to help manage his symptoms.          03/26/2024   11:44 AM  Depression screen PHQ 2/9  Decreased Interest 0  Down, Depressed, Hopeless 1  PHQ - 2 Score 1  Altered sleeping 3  Tired, decreased energy 1  Change in appetite 0  Feeling bad or failure about yourself  2  Trouble concentrating 2  Moving slowly or fidgety/restless 2  Suicidal thoughts 0  PHQ-9 Score 11    Review of Systems  Constitutional:  Negative for chills and fever.  HENT:  Negative for tinnitus.   Eyes:  Negative for blurred vision and photophobia.  Respiratory:  Negative for cough and sputum production.   Cardiovascular:  Negative for chest pain.  Gastrointestinal:  Negative for diarrhea, nausea and vomiting.  Genitourinary:  Negative for dysuria.  Skin:  Negative for rash.  Neurological:  Negative for headaches.     Past Medical History:  Diagnosis Date   Bipolar disorder (HCC)    HIV (human immunodeficiency virus infection) (HCC)    HIV (human immunodeficiency virus infection) (HCC)    Hypertension    Immune deficiency disorder (HCC)     Outpatient Medications Prior to Visit  Medication Sig Dispense Refill   acetaminophen (TYLENOL) 500 MG tablet Take 1,000 mg by mouth 3 (three) times daily as needed for pain. For pain     bictegravir-emtricitabine-tenofovir AF (BIKTARVY) 50-200-25 MG  TABS tablet Take 1 tablet by mouth daily. Try to take at the same time each day with or without food. 30 tablet 5   No facility-administered medications prior to visit.     Allergies  Allergen Reactions   Prunus Persica Hives    Only fresh D/T THE FUZZ   Tivicay [Dolutegravir] Rash   Gabapentin    Nsaids Itching and Rash   Tramadol Hcl Itching and Rash    Can take Hydrocodone, Oxycodone    Social History   Tobacco Use   Smoking status: Every Day    Types: E-cigarettes   Smokeless tobacco: Never   Tobacco comments:    Not ready to quit vaping.   Vaping Use    Vaping status: Every Day   Substances: Nicotine, THC  Substance Use Topics   Alcohol use: Yes   Drug use: Yes    Frequency: 3.0 times per week    Types: Marijuana    Comment: every day     Social History   Substance and Sexual Activity  Sexual Activity Yes   Partners: Female   Comment: pt declined condoms        Objective   Objective:   Vitals:   03/26/24 1124  BP: 129/81  Pulse: 62  Temp: 98 F (36.7 C)  TempSrc: Temporal  Weight: 155 lb (70.3 kg)    Body mass index is 23.57 kg/m.  Physical Exam Vitals reviewed.  Constitutional:      Appearance: He is well-developed.  HENT:     Mouth/Throat:     Dentition: Normal dentition. No dental abscesses.  Cardiovascular:     Rate and Rhythm: Normal rate and regular rhythm.     Heart sounds: Normal heart sounds.  Pulmonary:     Effort: Pulmonary effort is normal.     Breath sounds: Normal breath sounds.  Abdominal:     General: There is no distension.     Palpations: Abdomen is soft.     Tenderness: There is no abdominal tenderness.  Lymphadenopathy:     Cervical: No cervical adenopathy.  Skin:    General: Skin is warm and dry.     Findings: No rash.  Neurological:     Mental Status: He is alert and oriented to person, place, and time.  Psychiatric:        Judgment: Judgment normal.           Assessment & Plan:     HIV - Virologically controlled with CD4 > 500 -   Switch off Symtuza to Stem has gone well. No side effects reported. He is comfortable with 48m appointment follow up barring his labs continue to look great.  - Order viral load test. - Refill Biktarvy prescription.  Substance use disorder in remission -  Cessation of cocaine and marijuana vape use. Plans to quit nicotine vaping. Counseling encouraged for anger and agitation management. Insurance coverage for counseling is a concern. - Encourage seeking counseling for support during substance use cessation if he finds craving /  brain noise creeping in  - Advise contacting insurance for coverage options for counseling services.  Chronic low back pain - Exacerbated by workouts. Tizanidine has worked well for symptom management. Pain management referral is in process per PCP. Physical therapy limited by insurance coverage. - Prescribe tizanidine 2-4 mg as needed at night.  - Advise moderation of weight during exercises to avoid exacerbating back pain - should not hurt while performing exercise - difference between sore and injured.  -  He does have plate/screws in spine   Follow-up - Follow-up plan agreed for monitoring HIV management and overall health. Coordination with primary care provider at [facility] noted. - Schedule follow-up appointment in 6 months. - Advise contacting provider earlier if needed.        Orders Placed This Encounter  Procedures   HIV 1 RNA quant-no reflex-bld    Meds ordered this encounter  Medications   tizanidine (ZANAFLEX) 2 MG capsule    Sig: Take 1-2 capsules (2-4 mg total) by mouth daily as needed for muscle spasms.    Dispense:  20 capsule    Refill:  0    Supervising Provider:   Judyann Munson [[id number]]   bictegravir-emtricitabine-tenofovir AF (BIKTARVY) 50-200-25 MG TABS tablet    Sig: Take 1 tablet by mouth daily. Try to take at the same time each day with or without food.    Dispense:  30 tablet    Refill:  5    Supervising Provider:   Judyann Munson [phone]    Prescription Type::   Renewal    Return in about 6 months (around 09/25/2024).   Rexene Alberts, MSN, NP-C [facility] for Infectious Disease [facility]  [email or web address] Pager: [phone] Office: [phone] [facility] Main Line: [phone] *Secure Chat Communication Welcome

## 2024-03-26 NOTE — Patient Instructions (Addendum)
 Counseling is a great idea I think.   Please continue your biktarvy everyday.   Will see you in 6 months for routine care.   Short term trial of the zanaflex to help with back pain 1/2 - 1 tablet as needed at night   Moderate weight with lifts are probably in your best interest until you further evaluate your spine problems. More weight will create more downward force on the back and disc space. Should not hurt when you perform the exercises. Sore after is OK but not during. Use that as your gauge.

## 2024-03-28 LAB — HIV-1 RNA QUANT-NO REFLEX-BLD
HIV 1 RNA Quant: NOT DETECTED {copies}/mL
HIV-1 RNA Quant, Log: NOT DETECTED {Log_copies}/mL

## 2024-04-05 ENCOUNTER — Encounter: Payer: Self-pay | Admitting: Nurse Practitioner

## 2024-04-05 ENCOUNTER — Other Ambulatory Visit: Payer: Self-pay | Admitting: Nurse Practitioner

## 2024-04-05 DIAGNOSIS — R748 Abnormal levels of other serum enzymes: Secondary | ICD-10-CM

## 2024-04-05 DIAGNOSIS — R7989 Other specified abnormal findings of blood chemistry: Secondary | ICD-10-CM

## 2024-04-12 ENCOUNTER — Encounter: Payer: Self-pay | Admitting: Nurse Practitioner

## 2024-04-13 ENCOUNTER — Ambulatory Visit
Admission: RE | Admit: 2024-04-13 | Discharge: 2024-04-13 | Disposition: A | Discharge status: Home / Self Care | Source: Ambulatory Visit | Attending: Nurse Practitioner | Admitting: Nurse Practitioner

## 2024-04-13 ENCOUNTER — Other Ambulatory Visit: Payer: Self-pay | Admitting: Nurse Practitioner

## 2024-04-13 DIAGNOSIS — R7989 Other specified abnormal findings of blood chemistry: Secondary | ICD-10-CM

## 2024-04-30 ENCOUNTER — Ambulatory Visit (INDEPENDENT_AMBULATORY_CARE_PROVIDER_SITE_OTHER): Admitting: Infectious Diseases

## 2024-04-30 ENCOUNTER — Other Ambulatory Visit: Payer: Self-pay

## 2024-04-30 ENCOUNTER — Other Ambulatory Visit (HOSPITAL_COMMUNITY)
Admission: RE | Admit: 2024-04-30 | Discharge: 2024-04-30 | Disposition: A | Discharge status: Home / Self Care | Source: Ambulatory Visit | Attending: Infectious Diseases | Admitting: Infectious Diseases

## 2024-04-30 ENCOUNTER — Encounter: Payer: Self-pay | Admitting: Infectious Diseases

## 2024-04-30 VITALS — BP 126/85 | HR 78 | Temp 99.4°F | Ht 68.0 in | Wt 154.0 lb

## 2024-04-30 DIAGNOSIS — Z113 Encounter for screening for infections with a predominantly sexual mode of transmission: Secondary | ICD-10-CM | POA: Diagnosis present

## 2024-04-30 DIAGNOSIS — Z0000 Encounter for general adult medical examination without abnormal findings: Secondary | ICD-10-CM | POA: Insufficient documentation

## 2024-04-30 DIAGNOSIS — Z87898 Personal history of other specified conditions: Secondary | ICD-10-CM

## 2024-04-30 DIAGNOSIS — Z5181 Encounter for therapeutic drug level monitoring: Secondary | ICD-10-CM | POA: Diagnosis not present

## 2024-04-30 DIAGNOSIS — Z712 Person consulting for explanation of examination or test findings: Secondary | ICD-10-CM

## 2024-04-30 DIAGNOSIS — B20 Human immunodeficiency virus [HIV] disease: Secondary | ICD-10-CM | POA: Diagnosis not present

## 2024-04-30 NOTE — Progress Notes (Unsigned)
 [address], [place], [place], [address]                                                                  Phn. [phone]; Fax: [phone]                                                                             Date: 04/30/24  Reason for Visit: Routine HIV care.    HPI: Gary [name] is a 53 y.o.old male with a history of HIV, Bipolar d/o, HTN, h/o substance use in remission, Zoster who is here for some questions. Last seen @ [facility] approx a month ago and remains virally suppressed on Biktarvy  He has some questions and states that he was told he ? Possibly has hepatitis and herpes in the pain clinic and wanted to clarify with us  if he needs to be treated. Discussed last hepatitis panel done in March 2025 and was negative. He does not have an active lesions concerns for herpes but has h/o shingles in the past. Discussed no need for any additional treatment. No other concerns.    ROS: As stated in above HPI; all other systems were reviewed and are otherwise negative unless noted below  No reported fever / chills, night sweats, unintentional weight loss, acute visual change, odynophagia, chest pain/pressure, new or worsened SOB or WOB, nausea, vomiting, diarrhea, dysuria, GU discharge, syncope, seizures, red/hot swollen joints, hallucinations / delusions, rashes, new allergies, unusual / excessive bleeding, swollen lymph nodes, or new hospitalizations/ED visits/Urgent Care visits since the pt was last seen.  PMH/ PSH/ FamHx / Social Hx , medications and allergies reviewed and updated as appropriate; please see corresponding tab in EHR / prior notes                                        Current Outpatient Medications on File Prior to Visit  Medication Sig Dispense Refill   acetaminophen   (TYLENOL ) 500 MG tablet Take 1,000 mg by mouth 3 (three) times daily as needed for pain. For pain     bictegravir-emtricitabine -tenofovir  AF (BIKTARVY) 50-200-25 MG TABS tablet Take 1 tablet by mouth daily. Try to take at the same time each day with or without food. 30 tablet 5   tiZANidine  (ZANAFLEX ) 2 MG tablet Take 1-2 capsules (2-4 mg total) by mouth daily as needed for muscle spasms 20 tablet 0   No current facility-administered medications on file prior to visit.   Allergies  Allergen Reactions   Prunus Persica Hives  Only fresh D/T THE FUZZ   Tivicay  [Dolutegravir ] Rash    Tolerated Biktarvy fine 2025.    Gabapentin     Nsaids Itching and Rash   Tramadol Hcl Itching and Rash    Can take Hydrocodone , Oxycodone    Past Medical History:  Diagnosis Date   Bipolar disorder (HCC)    HIV (human immunodeficiency virus infection) (HCC)    HIV (human immunodeficiency virus infection) (HCC)    Hypertension    Immune deficiency disorder [facility])    Past Surgical History:  Procedure Laterality Date   AMPUTATION  11/04/2011   Procedure: AMPUTATION DIGIT;  Surgeon: Jasmine Mesi;  Location: [facility] OR;  Service: Orthopedics;  Laterality: Right;  Right Second Toe Distal Phalanx Amputation   BUNIONECTOMY     CERVICAL FUSION     Social History   Socioeconomic History   Marital status: Legally Separated    Spouse name: Not on file   Number of children: 1   Years of education: Not on file   Highest education level: Not on file  Occupational History   Occupation: disability   Tobacco Use   Smoking status: Every Day    Types: E-cigarettes   Smokeless tobacco: Never   Tobacco comments:    Not ready to quit vaping.   Vaping Use   Vaping status: Every Day   Substances: Nicotine , THC  Substance and Sexual Activity   Alcohol use: Yes   Drug use: Yes    Frequency: 3.0 times per week    Types: Marijuana    Comment: every day   Sexual activity: Yes    Partners: Female    Comment: pt  declined condoms  Other Topics Concern   Not on file  Social History Narrative   Not on file   Social Drivers of Health   Financial Resource Strain: Not on file  Food Insecurity: Low Risk  (02/17/2024)   Received from [facility]   Hunger Vital Sign    Worried About Running Out of Food in the Last Year: Never true    Ran Out of Food in the Last Year: Never true  Transportation Needs: Not on file (02/17/2024)  Physical Activity: Not on file  Stress: Not on file  Social Connections: Unknown (05/03/2022)   Received from [facility], [facility]   Social Network    Social Network: Not on file  Intimate Partner Violence: Unknown (03/25/2022)   Received from [employer], [facility]   HITS    Physically Hurt: Not on file    Insult or Talk Down To: Not on file    Threaten Physical Harm: Not on file    Scream or Curse: Not on file   Family History  Problem Relation Age of Onset   Diabetes Father    Diabetes Brother    Liver disease Neg Hx    Colon cancer Neg Hx    Esophageal cancer Neg Hx    Vitals BP 126/85   Pulse 78   Temp 99.4 F (37.4 C) (Oral)   Ht 5\' 8"  (1.727 m)   Wt 154 lb (69.9 kg)   SpO2 96%   BMI 23.42 kg/m    Examination  Gen: no acute distress HEENT: [place]/AT, no scleral icterus, no pale conjunctivae, hearing normal, oral mucosa moist Neck: Supple Cardio: Regular rate and rhythm Resp: Pulmonary effort normal in room air GI: nondistended GU: Musc: Extremities: No pedal edema Skin: No rashes Neuro: grossly non focal , awake, alert and oriented *  3  Psych: Calm, cooperative  Lab Results HIV 1 RNA Quant  Date Value  03/26/2024 NOT DETECTED copies/mL  04/27/2023 <20 Copies/mL (H)  01/07/2023 <20 Copies/mL (H)   CD4 T Cell Abs (/uL)  Date Value  02/23/2024 741  04/27/2023 677  05/15/2019 918   No results found for: "HIV1GENOSEQ" Lab Results  Component Value Date   WBC 4.9 04/30/2024   HGB 13.9 04/30/2024   HCT 46.4 04/30/2024   MCV  77.9 (L) 04/30/2024   PLT 254 04/30/2024    Lab Results  Component Value Date   CREATININE 1.13 04/30/2024   BUN 13 04/30/2024   NA 138 04/30/2024   K 4.3 04/30/2024   CL 103 04/30/2024   CO2 26 04/30/2024   Lab Results  Component Value Date   ALT 197 (H) 04/30/2024   AST 104 (H) 04/30/2024   ALKPHOS 72 08/18/2023   BILITOT 0.7 04/30/2024    Lab Results  Component Value Date   CHOL 187 04/27/2023   TRIG 63 04/27/2023   HDL 45 04/27/2023   LDLCALC 127 (H) 04/27/2023   Lab Results  Component Value Date   HAV NEG 09/22/2011   Lab Results  Component Value Date   HEPBSAG NON-REACTIVE 02/23/2024   HEPBSAB POS (A) 09/22/2011   Lab Results  Component Value Date   HCVAB NEG 12/10/2009   No results found for: "CHLAMYDIAWP", "N" No results found for: "GCPROBEAPT" No results found for: "QUANTGOLD"  Health Maintenance: Immunization History  Administered Date(s) Administered   Fluzone Influenza virus vaccine,trivalent (IIV3), split virus 12/28/2007, 11/26/2009, 11/23/2010   Hepatitis A 12/28/2007, 10/06/2011   Hepatitis A, Ped/Adol-2 Dose 12/28/2007, 10/06/2011   Hepatitis B 12/28/2007   Influenza Split 10/06/2011   Influenza Whole 12/28/2007, 11/26/2009, 11/23/2010   Influenza, Seasonal, Injecte, Preservative Fre 02/23/2024   Influenza,inj,Quad PF,6+ Mos 10/30/2013, 12/10/2014, 03/03/2016, 10/29/2018, 09/04/2019   Influenza-Unspecified 10/06/2011, 10/30/2013, 12/10/2014, 09/02/2015, 03/03/2016, 11/15/2017   Meningococcal Acwy, Unspecified 11/15/2017   Meningococcal Mcv4o 11/15/2017   PNEUMOCOCCAL CONJUGATE-20 02/23/2024   Pneumococcal Polysaccharide-23 12/28/2007, 11/03/2011, 01/29/2014, 10/29/2018   Pneumococcal-Unspecified 12/28/2007, 11/03/2011, 01/29/2014    Assessment/Plan: # HIV - continue PO Biktarvy as is - Fu with primary HIV provider as planned   # Medication monitoring  - CBC and CMP today  # STD screening  - Urine GC and RPR  #Immunization   #Health maintenance - to be discussed in subsequent visits   Patient's labs were reviewed as well as his previous records. Patients questions were addressed and answered. Safe sex counseling done.   I have personally spent 40 minutes involved in face-to-face and non-face-to-face activities for this patient on the day of the visit. Professional time spent includes the following activities: Preparing to see the patient (review of tests), Obtaining and/or reviewing separately obtained history (admission/discharge record), Performing a medically appropriate examination and/or evaluation , Ordering medications/tests/procedures, referring and communicating with other health care professionals, Documenting clinical information in the EMR, Independently interpreting results (not separately reported), Communicating results to the patient/family/caregiver, Counseling and educating the patient/family/caregiver and Care coordination (not separately reported).   Of note, portions of this note may have been created with voice recognition software. While this note has been edited for accuracy, occasional wrong-word or 'sound-a-like' substitutions may have occurred due to the inherent limitations of voice recognition software.   Electronically signed by:  Terre Ferri, MD Infectious Disease Physician [facility]ious [facility] [address] [place], [place] [address] Phone: [phone]  Fax: [phone]

## 2024-05-01 ENCOUNTER — Ambulatory Visit: Payer: Self-pay

## 2024-05-01 ENCOUNTER — Other Ambulatory Visit: Payer: Self-pay | Admitting: Infectious Diseases

## 2024-05-01 DIAGNOSIS — Z712 Person consulting for explanation of examination or test findings: Secondary | ICD-10-CM | POA: Insufficient documentation

## 2024-05-01 DIAGNOSIS — Z113 Encounter for screening for infections with a predominantly sexual mode of transmission: Secondary | ICD-10-CM | POA: Insufficient documentation

## 2024-05-01 DIAGNOSIS — Z87898 Personal history of other specified conditions: Secondary | ICD-10-CM | POA: Insufficient documentation

## 2024-05-01 DIAGNOSIS — Z5181 Encounter for therapeutic drug level monitoring: Secondary | ICD-10-CM

## 2024-05-01 LAB — COMPREHENSIVE METABOLIC PANEL WITH GFR
AG Ratio: 1.3 (calc) (ref 1.0–2.5)
ALT: 197 U/L — ABNORMAL HIGH (ref 9–46)
AST: 104 U/L — ABNORMAL HIGH (ref 10–35)
Albumin: 4.2 g/dL (ref 3.6–5.1)
Alkaline phosphatase (APISO): 182 U/L — ABNORMAL HIGH (ref 35–144)
BUN: 13 mg/dL (ref 7–25)
CO2: 26 mmol/L (ref 20–32)
Calcium: 9.4 mg/dL (ref 8.6–10.3)
Chloride: 103 mmol/L (ref 98–110)
Creat: 1.13 mg/dL (ref 0.70–1.30)
Globulin: 3.3 g/dL (ref 1.9–3.7)
Glucose, Bld: 103 mg/dL — ABNORMAL HIGH (ref 65–99)
Potassium: 4.3 mmol/L (ref 3.5–5.3)
Sodium: 138 mmol/L (ref 135–146)
Total Bilirubin: 0.7 mg/dL (ref 0.2–1.2)
Total Protein: 7.5 g/dL (ref 6.1–8.1)
eGFR: 79 mL/min/{1.73_m2} (ref >=60)

## 2024-05-01 LAB — CBC
HCT: 46.4 % (ref 38.5–50.0)
Hemoglobin: 13.9 g/dL (ref 13.2–17.1)
MCH: 23.3 pg — ABNORMAL LOW (ref 27.0–33.0)
MCHC: 30 g/dL — ABNORMAL LOW (ref 32.0–36.0)
MCV: 77.9 fL — ABNORMAL LOW (ref 80.0–100.0)
MPV: 10.9 fL (ref 7.5–12.5)
Platelets: 254 10*3/uL (ref 140–400)
RBC: 5.96 10*6/uL — ABNORMAL HIGH (ref 4.20–5.80)
RDW: 13.9 % (ref 11.0–15.0)
WBC: 4.9 10*3/uL (ref 3.8–10.8)

## 2024-05-01 LAB — URINE CYTOLOGY ANCILLARY ONLY
Chlamydia: NEGATIVE
Comment: NEGATIVE
Comment: NORMAL
Neisseria Gonorrhea: NEGATIVE

## 2024-05-01 LAB — RPR: RPR Ser Ql: NONREACTIVE

## 2024-05-01 NOTE — Telephone Encounter (Signed)
 Called Gary [name], relayed per Dr. Gillian Lacrosse that LFTs were elevated. States he did not have any alcohol prior to his appointment, only his pain medication. He will come in next week for repeat labs. Would like to wait to schedule provider follow up until after results are back. Says he has upcoming liver imaging appointment.   Lari Linson, BSN, RN

## 2024-05-09 ENCOUNTER — Other Ambulatory Visit

## 2024-05-09 ENCOUNTER — Other Ambulatory Visit: Payer: Self-pay

## 2024-05-09 DIAGNOSIS — Z5181 Encounter for therapeutic drug level monitoring: Secondary | ICD-10-CM

## 2024-05-09 LAB — COMPREHENSIVE METABOLIC PANEL WITH GFR
AG Ratio: 1.2 (calc) (ref 1.0–2.5)
ALT: 352 U/L — ABNORMAL HIGH (ref 9–46)
AST: 220 U/L — ABNORMAL HIGH (ref 10–35)
Albumin: 4.2 g/dL (ref 3.6–5.1)
Alkaline phosphatase (APISO): 206 U/L — ABNORMAL HIGH (ref 35–144)
BUN: 13 mg/dL (ref 7–25)
CO2: 27 mmol/L (ref 20–32)
Calcium: 9.2 mg/dL (ref 8.6–10.3)
Chloride: 105 mmol/L (ref 98–110)
Creat: 1.17 mg/dL (ref 0.70–1.30)
Globulin: 3.4 g/dL (ref 1.9–3.7)
Glucose, Bld: 95 mg/dL (ref 65–99)
Potassium: 4.6 mmol/L (ref 3.5–5.3)
Sodium: 138 mmol/L (ref 135–146)
Total Bilirubin: 0.7 mg/dL (ref 0.2–1.2)
Total Protein: 7.6 g/dL (ref 6.1–8.1)
eGFR: 76 mL/min/{1.73_m2} (ref >=60)

## 2024-05-10 ENCOUNTER — Ambulatory Visit: Payer: Self-pay | Admitting: Infectious Diseases

## 2024-05-11 ENCOUNTER — Encounter: Payer: Self-pay | Admitting: Infectious Diseases

## 2024-05-11 NOTE — Progress Notes (Signed)
 Spoke with Amara over phone with 2 identifiers, discussed about elevated liver enzymes as well as US  abdomen findings concerning for biliary ductal dilatation. Discussed he will possible need GI referral for MRCP. He is going to discuss with his PCP who ordered US  liver for GI referral as well as any further work up indicated.   Terre Ferri, MD Infectious Disease Physician [facility] for Infectious Disease [address] [place], [place] [address] Phone: [phone]  Fax: [phone]

## 2024-05-18 NOTE — Progress Notes (Signed)
 The 10-year ASCVD risk score (Arnett DK, et al., 2019) is: 4.7%   Values used to calculate the score:     Age: 53 years     Sex: Male     Is Non-Hispanic African American: Yes     Diabetic: No     Tobacco smoker: No     Systolic Blood Pressure: 126 mmHg     Is BP treated: No     HDL Cholesterol: 69 mg/dL     Total Cholesterol: 199 mg/dL  ASCVD < 5%.  Ralph Brouwer, BSN, RN

## 2024-05-21 ENCOUNTER — Ambulatory Visit: Admitting: Infectious Diseases

## 2024-06-09 ENCOUNTER — Inpatient Hospital Stay (HOSPITAL_COMMUNITY)
Admission: EM | Admit: 2024-06-09 | Discharge: 2024-06-12 | DRG: 918 | Disposition: A | Discharge status: Other Acute Inpatient Hospital | Attending: Internal Medicine | Admitting: Internal Medicine

## 2024-06-09 DIAGNOSIS — E872 Acidosis, unspecified: Secondary | ICD-10-CM | POA: Diagnosis present

## 2024-06-09 DIAGNOSIS — Z79899 Other long term (current) drug therapy: Secondary | ICD-10-CM | POA: Diagnosis not present

## 2024-06-09 DIAGNOSIS — F250 Schizoaffective disorder, bipolar type: Secondary | ICD-10-CM | POA: Diagnosis present

## 2024-06-09 DIAGNOSIS — B20 Human immunodeficiency virus [HIV] disease: Secondary | ICD-10-CM | POA: Diagnosis present

## 2024-06-09 DIAGNOSIS — T405X2A Poisoning by cocaine, intentional self-harm, initial encounter: Secondary | ICD-10-CM | POA: Diagnosis present

## 2024-06-09 DIAGNOSIS — F17290 Nicotine dependence, other tobacco product, uncomplicated: Secondary | ICD-10-CM | POA: Diagnosis present

## 2024-06-09 DIAGNOSIS — R45850 Homicidal ideations: Secondary | ICD-10-CM | POA: Diagnosis present

## 2024-06-09 DIAGNOSIS — T50901A Poisoning by unspecified drugs, medicaments and biological substances, accidental (unintentional), initial encounter: Secondary | ICD-10-CM | POA: Diagnosis present

## 2024-06-09 DIAGNOSIS — I959 Hypotension, unspecified: Secondary | ICD-10-CM | POA: Diagnosis present

## 2024-06-09 DIAGNOSIS — Z886 Allergy status to analgesic agent status: Secondary | ICD-10-CM | POA: Diagnosis not present

## 2024-06-09 DIAGNOSIS — T391X2A Poisoning by 4-Aminophenol derivatives, intentional self-harm, initial encounter: Secondary | ICD-10-CM | POA: Diagnosis present

## 2024-06-09 DIAGNOSIS — R45851 Suicidal ideations: Secondary | ICD-10-CM | POA: Diagnosis not present

## 2024-06-09 DIAGNOSIS — I10 Essential (primary) hypertension: Secondary | ICD-10-CM | POA: Diagnosis present

## 2024-06-09 DIAGNOSIS — Z833 Family history of diabetes mellitus: Secondary | ICD-10-CM

## 2024-06-09 DIAGNOSIS — Z91018 Allergy to other foods: Secondary | ICD-10-CM

## 2024-06-09 DIAGNOSIS — Z885 Allergy status to narcotic agent status: Secondary | ICD-10-CM | POA: Diagnosis not present

## 2024-06-09 DIAGNOSIS — N179 Acute kidney failure, unspecified: Secondary | ICD-10-CM | POA: Diagnosis present

## 2024-06-09 DIAGNOSIS — T50902A Poisoning by unspecified drugs, medicaments and biological substances, intentional self-harm, initial encounter: Principal | ICD-10-CM

## 2024-06-09 DIAGNOSIS — F1420 Cocaine dependence, uncomplicated: Secondary | ICD-10-CM | POA: Diagnosis present

## 2024-06-09 DIAGNOSIS — Z9151 Personal history of suicidal behavior: Secondary | ICD-10-CM

## 2024-06-09 DIAGNOSIS — F319 Bipolar disorder, unspecified: Secondary | ICD-10-CM | POA: Diagnosis present

## 2024-06-09 LAB — RAPID URINE DRUG SCREEN, HOSP PERFORMED
Amphetamines: NOT DETECTED
Barbiturates: NOT DETECTED
Benzodiazepines: NOT DETECTED
Cocaine: NOT DETECTED
Opiates: POSITIVE — AB
Tetrahydrocannabinol: NOT DETECTED

## 2024-06-09 LAB — CBC WITH DIFFERENTIAL/PLATELET
Abs Immature Granulocytes: 0.04 10*3/uL (ref 0.00–0.07)
Basophils Absolute: 0 10*3/uL (ref 0.0–0.1)
Basophils Relative: 0 %
Eosinophils Absolute: 0.4 10*3/uL (ref 0.0–0.5)
Eosinophils Relative: 4 %
HCT: 51.9 % (ref 39.0–52.0)
Hemoglobin: 15.6 g/dL (ref 13.0–17.0)
Immature Granulocytes: 0 %
Lymphocytes Relative: 17 %
Lymphs Abs: 1.5 10*3/uL (ref 0.7–4.0)
MCH: 23.6 pg — ABNORMAL LOW (ref 26.0–34.0)
MCHC: 30.1 g/dL (ref 30.0–36.0)
MCV: 78.6 fL — ABNORMAL LOW (ref 80.0–100.0)
Monocytes Absolute: 0.7 10*3/uL (ref 0.1–1.0)
Monocytes Relative: 7 %
Neutro Abs: 6.4 10*3/uL (ref 1.7–7.7)
Neutrophils Relative %: 72 %
Platelets: 246 10*3/uL (ref 150–400)
RBC: 6.6 MIL/uL — ABNORMAL HIGH (ref 4.22–5.81)
RDW: 15.8 % — ABNORMAL HIGH (ref 11.5–15.5)
WBC: 9 10*3/uL (ref 4.0–10.5)
nRBC: 0 % (ref 0.0–0.2)

## 2024-06-09 LAB — URINALYSIS, ROUTINE W REFLEX MICROSCOPIC
Bilirubin Urine: NEGATIVE
Glucose, UA: NEGATIVE mg/dL
Ketones, ur: NEGATIVE mg/dL
Leukocytes,Ua: NEGATIVE
Nitrite: NEGATIVE
Protein, ur: 30 mg/dL — AB
Specific Gravity, Urine: 1.027 (ref 1.005–1.030)
pH: 5 (ref 5.0–8.0)

## 2024-06-09 LAB — BASIC METABOLIC PANEL WITH GFR
Anion gap: 20 — ABNORMAL HIGH (ref 5–15)
BUN: 24 mg/dL — ABNORMAL HIGH (ref 6–20)
CO2: 19 mmol/L — ABNORMAL LOW (ref 22–32)
Calcium: 8.6 mg/dL — ABNORMAL LOW (ref 8.9–10.3)
Chloride: 104 mmol/L (ref 98–111)
Creatinine, Ser: 2.92 mg/dL — ABNORMAL HIGH (ref 0.61–1.24)
GFR, Estimated: 25 mL/min — ABNORMAL LOW (ref >60)
Glucose, Bld: 135 mg/dL — ABNORMAL HIGH (ref 70–99)
Potassium: 3.8 mmol/L (ref 3.5–5.1)
Sodium: 143 mmol/L (ref 135–145)

## 2024-06-09 LAB — COMPREHENSIVE METABOLIC PANEL WITH GFR
ALT: 478 U/L — ABNORMAL HIGH (ref 0–44)
AST: 512 U/L — ABNORMAL HIGH (ref 15–41)
Albumin: 2.9 g/dL — ABNORMAL LOW (ref 3.5–5.0)
Alkaline Phosphatase: 126 U/L (ref 38–126)
Anion gap: 11 (ref 5–15)
BUN: 24 mg/dL — ABNORMAL HIGH (ref 6–20)
CO2: 18 mmol/L — ABNORMAL LOW (ref 22–32)
Calcium: 8.3 mg/dL — ABNORMAL LOW (ref 8.9–10.3)
Chloride: 109 mmol/L (ref 98–111)
Creatinine, Ser: 2.02 mg/dL — ABNORMAL HIGH (ref 0.61–1.24)
GFR, Estimated: 39 mL/min — ABNORMAL LOW (ref >60)
Glucose, Bld: 117 mg/dL — ABNORMAL HIGH (ref 70–99)
Potassium: 5.2 mmol/L — ABNORMAL HIGH (ref 3.5–5.1)
Sodium: 138 mmol/L (ref 135–145)
Total Bilirubin: 0.7 mg/dL (ref 0.0–1.2)
Total Protein: 6.5 g/dL (ref 6.5–8.1)

## 2024-06-09 LAB — GLUCOSE, CAPILLARY
Glucose-Capillary: 101 mg/dL — ABNORMAL HIGH (ref 70–99)
Glucose-Capillary: 145 mg/dL — ABNORMAL HIGH (ref 70–99)

## 2024-06-09 LAB — MRSA NEXT GEN BY PCR, NASAL: MRSA by PCR Next Gen: NOT DETECTED

## 2024-06-09 LAB — BLOOD GAS, VENOUS
Acid-base deficit: 2 mmol/L (ref 0.0–2.0)
Bicarbonate: 25.8 mmol/L (ref 20.0–28.0)
O2 Saturation: 68.8 %
Patient temperature: 36.9
pCO2, Ven: 55 mmHg (ref 44–60)
pH, Ven: 7.28 (ref 7.25–7.43)
pO2, Ven: 41 mmHg (ref 32–45)

## 2024-06-09 LAB — SALICYLATE LEVEL: Salicylate Lvl: 14.7 mg/dL (ref 7.0–30.0)

## 2024-06-09 LAB — MAGNESIUM: Magnesium: 2 mg/dL (ref 1.7–2.4)

## 2024-06-09 LAB — ETHANOL: Alcohol, Ethyl (B): <15 mg/dL (ref <15)

## 2024-06-09 LAB — ACETAMINOPHEN LEVEL: Acetaminophen (Tylenol), Serum: 41 ug/mL — ABNORMAL HIGH (ref 10–30)

## 2024-06-09 LAB — PROTIME-INR
INR: 1.3 — ABNORMAL HIGH (ref 0.8–1.2)
Prothrombin Time: 16 s — ABNORMAL HIGH (ref 11.4–15.2)

## 2024-06-09 MED ORDER — DEXTROSE 5 % IV SOLN: 15.0000 mg/kg/h | INTRAVENOUS | Status: DC

## 2024-06-09 MED ORDER — NALOXONE HCL 0.4 MG/ML IJ SOLN
INTRAMUSCULAR | Status: AC
  Administered 2024-06-09: 0.4 mg via INTRAVENOUS
  Filled 2024-06-09: qty 1

## 2024-06-09 MED ORDER — POLYETHYLENE GLYCOL 3350 17 G PO PACK
17.0000 g | PACK | Freq: Every day | ORAL | Status: DC | PRN
  Administered 2024-06-11: 17 g via ORAL
  Filled 2024-06-09: qty 1

## 2024-06-09 MED ORDER — CHLORHEXIDINE GLUCONATE CLOTH 2 % EX PADS
6.0000 | MEDICATED_PAD | Freq: Every day | CUTANEOUS | Status: DC
  Administered 2024-06-09 – 2024-06-11 (×2): 6 via TOPICAL

## 2024-06-09 MED ORDER — DOCUSATE SODIUM 100 MG PO CAPS: 100.0000 mg | ORAL_CAPSULE | Freq: Two times a day (BID) | ORAL | Status: DC | PRN

## 2024-06-09 MED ORDER — HEPARIN SODIUM (PORCINE) 5000 UNIT/ML IJ SOLN
5000.0000 [IU] | Freq: Three times a day (TID) | INTRAMUSCULAR | Status: DC
  Administered 2024-06-09 – 2024-06-12 (×9): 5000 [IU] via SUBCUTANEOUS
  Filled 2024-06-09 (×9): qty 1

## 2024-06-09 MED ORDER — ONDANSETRON HCL 4 MG/2ML IJ SOLN
4.0000 mg | Freq: Once | INTRAMUSCULAR | Status: AC
  Administered 2024-06-09: 4 mg via INTRAVENOUS
  Filled 2024-06-09: qty 2

## 2024-06-09 MED ORDER — NOREPINEPHRINE 4 MG/250ML-% IV SOLN
INTRAVENOUS | Status: AC
  Filled 2024-06-09: qty 250

## 2024-06-09 MED ORDER — SODIUM CHLORIDE 0.9 % IV SOLN: 250.0000 mL | INTRAVENOUS | Status: AC

## 2024-06-09 MED ORDER — NALOXONE HCL 0.4 MG/ML IJ SOLN
INTRAMUSCULAR | Status: AC
  Administered 2024-06-09: 0.2 mg via INTRAVENOUS
  Filled 2024-06-09: qty 1

## 2024-06-09 MED ORDER — SODIUM CHLORIDE 0.9 % IV SOLN
15.0000 mg/kg | Freq: Once | INTRAVENOUS | Status: AC
  Administered 2024-06-10: 990 mg via INTRAVENOUS
  Filled 2024-06-09: qty 0.99

## 2024-06-09 MED ORDER — NOREPINEPHRINE 4 MG/250ML-% IV SOLN
0.0000 ug/min | INTRAVENOUS | Status: DC
  Administered 2024-06-09: 2 ug/min via INTRAVENOUS

## 2024-06-09 MED ORDER — LACTATED RINGERS IV SOLN: INTRAVENOUS | Status: AC

## 2024-06-09 MED ORDER — NALOXONE HCL 0.4 MG/ML IJ SOLN
0.4000 mg | Freq: Once | INTRAMUSCULAR | Status: AC
  Filled 2024-06-09: qty 1

## 2024-06-09 MED ORDER — ACETYLCYSTEINE LOAD VIA INFUSION
150.0000 mg/kg | Freq: Once | INTRAVENOUS | Status: DC
  Filled 2024-06-09: qty 325

## 2024-06-09 MED ORDER — ACETYLCYSTEINE LOAD VIA INFUSION
150.0000 mg/kg | Freq: Once | INTRAVENOUS | Status: AC
  Administered 2024-06-09: 9885 mg via INTRAVENOUS
  Filled 2024-06-09: qty 325

## 2024-06-09 MED ORDER — ACETYLCYSTEINE LOAD VIA INFUSION: 150.0000 mg/kg | Freq: Once | INTRAVENOUS | Status: DC

## 2024-06-09 MED ORDER — NALOXONE HCL 0.4 MG/ML IJ SOLN
0.2000 mg | Freq: Once | INTRAMUSCULAR | Status: AC
  Filled 2024-06-09: qty 1

## 2024-06-09 MED ORDER — DEXTROSE 5 % IV SOLN
15.0000 mg/kg/h | INTRAVENOUS | Status: DC
  Administered 2024-06-09: 15 mg/kg/h via INTRAVENOUS
  Filled 2024-06-09 (×2): qty 90

## 2024-06-09 MED ORDER — SODIUM CHLORIDE 0.9 % IV BOLUS
1000.0000 mL | Freq: Once | INTRAVENOUS | Status: AC
  Administered 2024-06-09: 1000 mL via INTRAVENOUS

## 2024-06-09 MED ORDER — LACTATED RINGERS IV BOLUS
500.0000 mL | Freq: Once | INTRAVENOUS | Status: AC
  Administered 2024-06-09: 500 mL via INTRAVENOUS

## 2024-06-09 MED ORDER — NALOXONE HCL 4 MG/10ML IJ SOLN
0.2500 mg/h | INTRAVENOUS | Status: DC
  Administered 2024-06-09: 0.25 mg/h via INTRAVENOUS
  Filled 2024-06-09 (×2): qty 10

## 2024-06-09 NOTE — Progress Notes (Addendum)
 eLink Physician-Brief Progress Note Patient Name: Gary [name] DOB: 1971/11/03 MRN: [id number]   Date of Service  06/09/2024  HPI/Events of Note  Called for hypotension and lethargy. Seen on camera. Had dropped BP to 70. Was in 90s earlier. HR in 70s. Sitting in bed. Wakes up and able to follow commands when RN is checking. Is on narcan  infusion. RR is 15. O2 sat is ok. Notes mention oxycodone  and seroquel  OD. EKG from earlier noted  eICU Interventions  LR bolus Add Levophed  via PIV to improve BP Repeat EKG ABG UDS pending Poison control also to be contacted       Intervention Category Major Interventions: Hypotension - evaluation and management  Beryle Bagsby G Keila Turan 06/09/2024, 8:28 PM  Addendum at 920 pm - EKG noted. Hypotension better. Talked to poison control. Seroquel  can cause hypotension and treatment would be fluids and pressors. Also to watch for urinary retention. No other specific agents. They suggested tylenol  and salicylate levels and CMP/INR which I ordered and a NAC bolus till more data is obtained. Pharmacy also contacted. ABG pending.   Addendum at 930 pm - Bladder scan over 900 cc. Will place foley and send UA as well.   Addendum at 11 pm - RN unable to place foley and patient combative and not allowing it. Discussed with RN. He is very awake at this time. SBP in 120s as well. I asked ground team Pa Gleason to speak with him and see if he will agree to urology coming and attempting it. He wants to try to urinate as well so we will see in the meantime. Hemodynamics and mental status have really improved.   Addendum at 11:55 pm - Tylenol  levels elevated and liver enzymes elevated. Will continue NAC for 24 hours. Spoke with poison control. Since tylenol  levels were checked only recently and are raised, it is unclear if the NAC will fully be able to block all of residual prior tylenol . Cross product elevation more than 15000 so they suggested sa dose of Fomepizole to block  ongoing damage from tylenol ; and to check tylenol  levels , INR and livcer enzymes in 12 hours and then at 22 hours to decide if NAC can be stopped ot to be continued.   Addendum at 5 am - Patient has veen very alert and with no issues last few hours. Also has been voiding. If he has issues voiding again then may need urology to consult. Will remove NPO and order a diet for him for this AM. Also he has been on a very tiny dose of narcan  per hour. Will stop it now and see how he does for 3 hours. If no issues then he can be fed. Retaining the order till then.   Addendum at 505 am - ON re-evaluation, will keep NPO and have day team assess for narcan  removal since multiple agents involved in his condition. I am going to let day team decide this .

## 2024-06-09 NOTE — ED Notes (Signed)
IVC in process  

## 2024-06-09 NOTE — ED Triage Notes (Signed)
 Pt BIB [facility] from home d/t OD. Pt was swallowing bunch of pills including oxycodone . Hx schizophrenia . Reported been hearing voices lately.

## 2024-06-09 NOTE — H&P (Signed)
 NAME:  Gary [name], MRN:  [id number], DOB:  28-Jun-1971, LOS: 0 ADMISSION DATE:  06/09/2024, CONSULTATION DATE:  06/09/2024 REFERRING MD:  Dr. Cottie - EDP, CHIEF COMPLAINT: Intentional drug overdose  History of Present Illness:  Gary [name] is a 53 year old male with a past medical history significant for bipolar disease, schizophrenia, HTN, and HIV who presented to the ED at [facility] via EMS after intentional drug overdose.  Per report patient's spouse called the EMS around 12:15 PM with concern for intentional overdose.  On EMS arrival patient was found with partially empty oxycodone  bottle.  Prescription was for 10 mg 90 tablets with only 20 tablets remaining.  This prescription was filled 3 days prior to admission.  There is also an open Seroquel  bottle near patient but unknown.  Patient was verbally combative with EMS but on ED arrival he was somnolent.  EMS does report the patient appeared to be having auditory hallucinations.  Wife reports that patient has not been sleeping well over the last week with reports of paranoia as well.   On ED arrival patient's vital signs were within normal limit.  Lab work significant for glucose 135, BUN 24, creatinine 2.92, GFR 25.  Given ongoing somnolence decision was made to admit with low-dose Narcan  drip  Pertinent  Medical History  Bipolar disease, schizophrenia, HTN, and HIV   Significant Hospital Events: Including procedures, antibiotic start and stop dates in addition to other pertinent events   6/21 presented with intentional drug overdose.  IVC paperwork completed admitted to ICU with Narcan  drip  Interim History / Subjective:  Sleepy but arousable reports overdose was intentional but denies any ongoing SI  Objective    Blood pressure 102/74, pulse 90, temperature (!) 97.3 F (36.3 C), temperature source Axillary, resp. rate 12, SpO2 100%.       No intake or output data in the 24 hours ending 06/09/24 1514 There were no vitals  filed for this visit.  Examination: General: Acute on chronic ill-appearing middle-age male lying on ED stretcher in no acute distress HEENT: [place]/AT, MM pink/moist, PERRL,  Neuro: Sleepy but arousable, when aroused alert and oriented x 3 CV: s1s2 regular rate and rhythm, no murmur, rubs, or gallops,  PULM: Diminished slightly shallow breaths bilaterally, no increased work of breathing GI: soft, bowel sounds active in all 4 quadrants, non-tender, non-distended Extremities: warm/dry, no edema  Skin: no rashes or lesions  Resolved problem list   Assessment and Plan  Suicide attempt with intentional drug overdose - Unknown exact quantity but high suspicion for both oxycodone  and Seroquel  overdose.  Patient confirms this was an intentional overdose with suicide attempt History of bipolar/schizophrenia -Wife reports patient has not been sleeping the last week with auditory hallucinations and paranoia P: Low-dose Narcan  drip to hopefully offset opioid respiratory depression but try to avoid withdrawals Psych consult when mentation improves Suicide sitter Patient was IVC in ED  Acute kidney injury - Creatinine 2.92 with GFR 25 on admission compared to creatinine 1.17 with GFR 76 May 2025 P: Follow renal function  Monitor urine output Trend Bmet Avoid nephrotoxins Ensure adequate renal perfusion  And gentle IV hydration  Essential hypertension - Does not appear patient utilizes any antihypertensives at baseline P: Continuous telemetry  HIV - Home medications include Biktarvy P: To safely take oral medications  Best Practice (right click and Reselect all SmartList Selections daily)   Diet/type: NPO DVT prophylaxis prophylactic heparin   Pressure ulcer(s): N/A GI prophylaxis: PPI Lines: N/A  Foley:  N/A Code Status:  full code Last date of multidisciplinary goals of care discussion: Continue to update patient and family daily  Labs   CBC: Recent Labs  Lab 06/09/24 1314   WBC 9.0  NEUTROABS 6.4  HGB 15.6  HCT 51.9  MCV 78.6*  PLT 246    Basic Metabolic Panel: Recent Labs  Lab 06/09/24 1314  NA 143  K 3.8  CL 104  CO2 19*  GLUCOSE 135*  BUN 24*  CREATININE 2.92*  CALCIUM 8.6*  MG 2.0   GFR: CrCl cannot be calculated (Unknown ideal weight.). Recent Labs  Lab 06/09/24 1314  WBC 9.0    Liver Function Tests: No results for input(s): AST, ALT, ALKPHOS, BILITOT, PROT, ALBUMIN in the last 168 hours. No results for input(s): LIPASE, AMYLASE in the last 168 hours. No results for input(s): AMMONIA in the last 168 hours.  ABG No results found for: PHART, PCO2ART, PO2ART, HCO3, TCO2, ACIDBASEDEF, O2SAT   Coagulation Profile: No results for input(s): INR, PROTIME in the last 168 hours.  Cardiac Enzymes: No results for input(s): CKTOTAL, CKMB, CKMBINDEX, TROPONINI in the last 168 hours.  HbA1C: No results found for: HGBA1C  CBG: No results for input(s): GLUCAP in the last 168 hours.  Review of Systems:   Please see the history of present illness. All other systems reviewed and are negative    Past Medical History:  He,  has a past medical history of Bipolar disorder (HCC), HIV (human immunodeficiency virus infection) (HCC), HIV (human immunodeficiency virus infection) (HCC), Hypertension, and Immune deficiency disorder (HCC).   Surgical History:   Past Surgical History:  Procedure Laterality Date   AMPUTATION  11/04/2011   Procedure: AMPUTATION DIGIT;  Surgeon: Cordella Glendia Hutchinson;  Location: [facility] OR;  Service: Orthopedics;  Laterality: Right;  Right Second Toe Distal Phalanx Amputation   BUNIONECTOMY     CERVICAL FUSION       Social History:   reports that he has been smoking e-cigarettes. He has never used smokeless tobacco. He reports current alcohol use. He reports current drug use. Frequency: 3.00 times per week. Drug: Marijuana.   Family History:  His family history includes  Diabetes in his brother and father. There is no history of Liver disease, Colon cancer, or Esophageal cancer.   Allergies Allergies  Allergen Reactions   Prunus Persica Hives    Only fresh D/T THE FUZZ   Tivicay  [Dolutegravir ] Rash    Tolerated Biktarvy fine 2025.    Gabapentin     Nsaids Itching and Rash   Tramadol Hcl Itching and Rash    Can take Hydrocodone , Oxycodone      Home Medications  Prior to Admission medications   Medication Sig Start Date End Date Taking? Authorizing Provider  acetaminophen  (TYLENOL ) 500 MG tablet Take 1,000 mg by mouth 3 (three) times daily as needed for pain. For pain    [provider]  bictegravir-emtricitabine -tenofovir  AF (BIKTARVY) 50-200-25 MG TABS tablet Take 1 tablet by mouth daily. Try to take at the same time each day with or without food. 03/26/24   Melvenia Corean SAILOR, NP  tiZANidine  (ZANAFLEX ) 2 MG tablet Take 1-2 capsules (2-4 mg total) by mouth daily as needed for muscle spasms 03/26/24   Melvenia Corean SAILOR, NP     Critical care time:   CRITICAL CARE Performed by: Nolan Lasser D. Harris   Total critical care time: 40 minutes  Critical care time was exclusive of separately billable procedures and treating other patients.  Critical care was necessary to treat or prevent imminent or life-threatening deterioration.  Critical care was time spent personally by me on the following activities: development of treatment plan with patient and/or surrogate as well as nursing, discussions with consultants, evaluation of patient's response to treatment, examination of patient, obtaining history from patient or surrogate, ordering and performing treatments and interventions, ordering and review of laboratory studies, ordering and review of radiographic studies, pulse oximetry and re-evaluation of patient's condition.  Retha Bither D. Harris, NP-C [facility] Personal contact information can be found on Amion  If no contact or  response made please call [phone] 06/09/2024, 3:42 PM

## 2024-06-09 NOTE — Progress Notes (Signed)
 Pt resistive to answering. Other data from admission diagnosis and dr's note.

## 2024-06-09 NOTE — ED Notes (Signed)
 IVC'd 06/09/24, exp 06/16/24; IVC docs in blue zone for now; all follow-up copies, fax, etc. completed

## 2024-06-09 NOTE — ED Provider Notes (Signed)
 [place] EMERGENCY DEPARTMENT AT [facility] Provider Note   CSN: [id number] Arrival date & time: 06/09/24  1309     Patient presents with: Drug Overdose   Gary [name] is a 53 y.o. male presenting with concern for drug overdose.  EMS called to house with [employer] around 12:15 pm by patient's wife with concern he was eating pills.  Per EMS patient had an open bottle of roxycodone 10 mg with 90 tablets initially filled 3 days ago (confirmed on PDMP) and had about 20 tablets left.  He also had an open seroquel  bottle although unclear the dosing or how many were in there.  Patient was verbally combatative with EMS but became obtained 5 minutes prior to arrival.  Patient nonverbal for me.  Hx of schizoaffective disorder and responding to voices per EMS  Update -patient's wife now presents to the ED with the bottles of his medications.  This includes Seroquel  25 mg tablets.  It is not clear how many the patient may have taken.  He has also on oxycodone  10 mg tablets.  There were 90 tablets in the bottle and the bottle appears approximately half empty.  The patient's wife reports that do not live together.  However the patient's been expressing paranoia recently thinks the neighbors are spying on him.  His wife reports the neighbors have been passing around the code where people can watch him on their phones.     HPI     Prior to Admission medications   Medication Sig Start Date End Date Taking? Authorizing Provider  acetaminophen  (TYLENOL ) 500 MG tablet Take 1,000 mg by mouth 3 (three) times daily as needed for pain. For pain    [provider]  bictegravir-emtricitabine -tenofovir  AF (BIKTARVY) 50-200-25 MG TABS tablet Take 1 tablet by mouth daily. Try to take at the same time each day with or without food. 03/26/24   Melvenia Corean SAILOR, NP  tiZANidine  (ZANAFLEX ) 2 MG tablet Take 1-2 capsules (2-4 mg total) by mouth daily as needed for muscle spasms 03/26/24   Melvenia Corean SAILOR, NP    Allergies: Prunus persica, Tivicay  [dolutegravir ], Gabapentin , Nsaids, and Tramadol hcl    Review of Systems  Updated Vital Signs BP 102/74   Pulse 90   Temp (!) 97.3 F (36.3 C) (Axillary)   Resp 12   SpO2 100%   Physical Exam Constitutional:      Comments: nonverbal  HENT:     Head: Normocephalic and atraumatic.   Eyes:     Pupils: Pupils are equal, round, and reactive to light.     Comments: Pinpoint pupils   Cardiovascular:     Rate and Rhythm: Normal rate and regular rhythm.  Pulmonary:     Effort: Pulmonary effort is normal.  Abdominal:     General: There is no distension.     Tenderness: There is no abdominal tenderness.   Skin:    General: Skin is warm and dry.   Neurological:     Mental Status: He is alert.     (all labs ordered are listed, but only abnormal results are displayed) Labs Reviewed  BASIC METABOLIC PANEL WITH GFR - Abnormal; Notable for the following components:      Result Value   CO2 19 (*)    Glucose, Bld 135 (*)    BUN 24 (*)    Creatinine, Ser 2.92 (*)    Calcium 8.6 (*)    GFR, Estimated 25 (*)    Anion  gap 20 (*)    All other components within normal limits  CBC WITH DIFFERENTIAL/PLATELET - Abnormal; Notable for the following components:   RBC 6.60 (*)    MCV 78.6 (*)    MCH 23.6 (*)    RDW 15.8 (*)    All other components within normal limits  ETHANOL  MAGNESIUM   RAPID URINE DRUG SCREEN, HOSP PERFORMED    EKG: EKG Interpretation Date/Time:  Saturday June 09 2024 13:21:35 EDT Ventricular Rate:  106 PR Interval:  122 QRS Duration:  81 QT Interval:  338 QTC Calculation: 449 R Axis:   52  Text Interpretation: Sinus tachycardia Ventricular premature complex Aberrant conduction of SV complex(es) Confirmed by Cottie Cough [phone]) on 06/09/2024 1:31:37 PM  Radiology: No results found.   Procedures   Medications Ordered in the ED  naloxone  (NARCAN ) injection 0.4 mg (0.4 mg Intravenous Given  06/09/24 1317)  ondansetron  (ZOFRAN ) injection 4 mg (4 mg Intravenous Given 06/09/24 1317)  sodium chloride  0.9 % bolus 1,000 mL (1,000 mLs Intravenous New Bag/Given 06/09/24 1428)  naloxone  (NARCAN ) injection 0.2 mg (0.2 mg Intravenous Given 06/09/24 1428)    Clinical Course as of 06/09/24 1505  Sat Jun 09, 2024  1325 Awake after narcan , pt reporting intentional overdose but will not elaborate further [MT]  1331 IVC paperwork filed with Diplomatic Services operational officer.  Patient's spouse not answering at number listed [MT]  1409 No answer again from phone Erminio Croak [MT]  1419 Patient becoming again increasing hypopneic, additional narcan  ordered 0.2 mg  [MT]  1433 Critical care consulted [MT]    Clinical Course User Index [MT] Cottie Cough PARAS, MD                                 Medical Decision Making Amount and/or Complexity of Data Reviewed Labs: ordered. ECG/medicine tests: ordered.  Risk Prescription drug management.   This patient presents to the ED with concern for suspected drug overdose, intentional SI. This involves an extensive number of treatment options, and is a complaint that carries with it a high risk of complications and morbidity.  The differential diagnosis includes polypharmacy versus SI versus opiate overdose versus other  Additional history obtained from EMS and the patient's wife  External records from outside source obtained and reviewed including ED NP report  I ordered and personally interpreted labs.  The pertinent results include: No emergent findings  The patient was maintained on a cardiac monitor.  I personally viewed and interpreted the cardiac monitored which showed an underlying rhythm of: Sinus rhythm  Per my interpretation the patient's ECG shows no acute ischemic findings  I ordered medication including IV fluids for elevated anion gap and dehydration, IV Narcan  for suspected opiate overdose  I have reviewed the patients home medicines and have made  adjustments as needed  Test Considered: Low suspicion for stroke, pulmonary embolism, meningitis  I requested consultation with the critical care,  and discussed lab and imaging findings as well as pertinent plan - they recommend: Consult this pending  After the interventions noted above, I reevaluated the patient and found that they have: improved - breathing stabilized with narcan   I do not see evidence of ongoing seizure-like activity at this time.  The patient is able to answer simple questions.  IVC filed. Once the patient is medically cleared from an overdose standpoint, he would benefit from behavioral health evaluation.  His wife is in agreement with this.  Final diagnoses:  Suicidal ideation  Intentional drug overdose, initial encounter [facility])    ED Discharge Orders     None          Janeah Kovacich, Donnice PARAS, MD 06/09/24 1505

## 2024-06-10 DIAGNOSIS — N179 Acute kidney failure, unspecified: Secondary | ICD-10-CM | POA: Diagnosis not present

## 2024-06-10 DIAGNOSIS — F250 Schizoaffective disorder, bipolar type: Secondary | ICD-10-CM | POA: Diagnosis not present

## 2024-06-10 DIAGNOSIS — T391X2A Poisoning by 4-Aminophenol derivatives, intentional self-harm, initial encounter: Secondary | ICD-10-CM | POA: Diagnosis not present

## 2024-06-10 DIAGNOSIS — I10 Essential (primary) hypertension: Secondary | ICD-10-CM | POA: Diagnosis not present

## 2024-06-10 LAB — CBC
HCT: 43.8 % (ref 39.0–52.0)
Hemoglobin: 13.9 g/dL (ref 13.0–17.0)
MCH: 23.9 pg — ABNORMAL LOW (ref 26.0–34.0)
MCHC: 31.7 g/dL (ref 30.0–36.0)
MCV: 75.3 fL — ABNORMAL LOW (ref 80.0–100.0)
Platelets: 207 10*3/uL (ref 150–400)
RBC: 5.82 MIL/uL — ABNORMAL HIGH (ref 4.22–5.81)
RDW: 14.4 % (ref 11.5–15.5)
WBC: 8.7 10*3/uL (ref 4.0–10.5)
nRBC: 0 % (ref 0.0–0.2)

## 2024-06-10 LAB — BASIC METABOLIC PANEL WITH GFR
Anion gap: 8 (ref 5–15)
BUN: 13 mg/dL (ref 6–20)
CO2: 24 mmol/L (ref 22–32)
Calcium: 8.7 mg/dL — ABNORMAL LOW (ref 8.9–10.3)
Chloride: 107 mmol/L (ref 98–111)
Creatinine, Ser: 1.32 mg/dL — ABNORMAL HIGH (ref 0.61–1.24)
GFR, Estimated: >60 mL/min (ref >60)
Glucose, Bld: 103 mg/dL — ABNORMAL HIGH (ref 70–99)
Potassium: 4.3 mmol/L (ref 3.5–5.1)
Sodium: 139 mmol/L (ref 135–145)

## 2024-06-10 LAB — HEPATIC FUNCTION PANEL
ALT: 430 U/L — ABNORMAL HIGH (ref 0–44)
ALT: 431 U/L — ABNORMAL HIGH (ref 0–44)
AST: 394 U/L — ABNORMAL HIGH (ref 15–41)
AST: 389 U/L — ABNORMAL HIGH (ref 15–41)
Albumin: 2.5 g/dL — ABNORMAL LOW (ref 3.5–5.0)
Albumin: 2.8 g/dL — ABNORMAL LOW (ref 3.5–5.0)
Alkaline Phosphatase: 115 U/L (ref 38–126)
Alkaline Phosphatase: 131 U/L — ABNORMAL HIGH (ref 38–126)
Bilirubin, Direct: 0.2 mg/dL (ref 0.0–0.2)
Bilirubin, Direct: 0.1 mg/dL (ref 0.0–0.2)
Indirect Bilirubin: 0.6 mg/dL (ref 0.3–0.9)
Indirect Bilirubin: 0.5 mg/dL (ref 0.3–0.9)
Total Bilirubin: 0.8 mg/dL (ref 0.0–1.2)
Total Bilirubin: 0.6 mg/dL (ref 0.0–1.2)
Total Protein: 6.1 g/dL — ABNORMAL LOW (ref 6.5–8.1)
Total Protein: 6.7 g/dL (ref 6.5–8.1)

## 2024-06-10 LAB — ACETAMINOPHEN LEVEL
Acetaminophen (Tylenol), Serum: 11 ug/mL (ref 10–30)
Acetaminophen (Tylenol), Serum: <10 ug/mL — ABNORMAL LOW (ref 10–30)

## 2024-06-10 LAB — PROTIME-INR
INR: 1.3 — ABNORMAL HIGH (ref 0.8–1.2)
INR: 1.2 (ref 0.8–1.2)
Prothrombin Time: 16.2 s — ABNORMAL HIGH (ref 11.4–15.2)
Prothrombin Time: 15.5 s — ABNORMAL HIGH (ref 11.4–15.2)

## 2024-06-10 LAB — MAGNESIUM: Magnesium: 1.9 mg/dL (ref 1.7–2.4)

## 2024-06-10 LAB — SALICYLATE LEVEL: Salicylate Lvl: <7 mg/dL — ABNORMAL LOW (ref 7.0–30.0)

## 2024-06-10 LAB — OSMOLALITY: Osmolality: 320 mosm/kg — ABNORMAL HIGH (ref 275–295)

## 2024-06-10 LAB — GLUCOSE, CAPILLARY
Glucose-Capillary: 136 mg/dL — ABNORMAL HIGH (ref 70–99)
Glucose-Capillary: 117 mg/dL — ABNORMAL HIGH (ref 70–99)
Glucose-Capillary: 114 mg/dL — ABNORMAL HIGH (ref 70–99)

## 2024-06-10 LAB — PHOSPHORUS: Phosphorus: 2.5 mg/dL (ref 2.5–4.6)

## 2024-06-10 MED ORDER — RISPERIDONE 1 MG PO TABS
1.0000 mg | ORAL_TABLET | Freq: Two times a day (BID) | ORAL | Status: DC
  Administered 2024-06-10 – 2024-06-11 (×2): 1 mg via ORAL
  Filled 2024-06-10 (×3): qty 1

## 2024-06-10 MED ORDER — TAMSULOSIN HCL 0.4 MG PO CAPS
0.4000 mg | ORAL_CAPSULE | Freq: Every day | ORAL | Status: DC
  Administered 2024-06-10 – 2024-06-12 (×3): 0.4 mg via ORAL
  Filled 2024-06-10 (×3): qty 1

## 2024-06-10 MED ORDER — BICTEGRAVIR-EMTRICITAB-TENOFOV 50-200-25 MG PO TABS
1.0000 | ORAL_TABLET | Freq: Every day | ORAL | Status: DC
  Administered 2024-06-10 – 2024-06-12 (×3): 1 via ORAL
  Filled 2024-06-10 (×3): qty 1

## 2024-06-10 MED ORDER — OXYCODONE HCL 5 MG PO TABS
10.0000 mg | ORAL_TABLET | Freq: Four times a day (QID) | ORAL | Status: DC | PRN
  Administered 2024-06-10 – 2024-06-11 (×2): 10 mg via ORAL
  Filled 2024-06-10 (×2): qty 2

## 2024-06-10 NOTE — Consult Note (Signed)
 [facility] Psychiatric Consult Initial  Patient Name: .Gary [name]  MRN: [id number]  DOB: 09-Aug-1971  Consult Order details:  Orders (From admission, onward)     Start     Ordered   06/10/24 0859  IP CONSULT TO PSYCHIATRY       Ordering Provider: Arloa Benton BIRCH, NP  Provider:  (Not yet assigned)  Question Answer Comment  Location [facility]   Reason for Consult? Intentional overdose with suicide attempt, need psych eval      06/10/24 0858             Mode of Visit: In person    Psychiatry Consult Evaluation  Service Date: June 10, 2024 LOS:  LOS: 1 day  Chief Complaint I hear multiple voices commanding me to harm myself.   Primary Psychiatric Diagnoses  Schizoaffective disorder, Bipolar type.   Assessment  Gary [name] is a 53 y.o. male admitted: Medicallyfor 06/09/2024  1:09 PM for intentional overdose due to command auditory hallucination. He carries the psychiatric diagnoses of schizoaffective disorder, Bipolar type and has a past medical history of hypertension and HIV. Psychiatry consult was places for psychiatry evaluation for intentional overdose with suicide attempt.   His current presentation of command auditory hallucination with recent overdose, decreased need for sleep, auditory and visual hallucinations, paranoid delusions,thought broadcasting and thought insertion and previous history of Bipolar disorder is most consistent with schizoaffective disorder, Bipolar type.He meets criteria for inpatient admission based on recent overdose, ongoing command auditory hallucination, paranoid delusions and previous suicide attempt by hanging.He is currently at acute risk of harm to himself.  Current outpatient psychotropic medications include Seroquel  25 mg at bedtime and historically he has had a poor response to these medications. He was compliant with medications prior to admission as evidenced by reports from the patient. On initial  examination, patient appears stated age, calm and cooperative but worried about ongoing symptoms. Thought process is linear but preoccupied with ongoing auditory hallucination and paranoid delusion. He endorsed suicide ideation as commanded by the voices. Please see plan below for detailed recommendations.   Diagnoses:  Active Hospital problems: Principal Problem:   Drug overdose    Plan   ## Psychiatric Medication Recommendations:  --Please hold home medications for now until liver enzymes become normal.  --Consider Risperidone 1 mg twice daily for psychosis once hepatic functions normalize. Please up titrate as needed.  --May Consider Trazodone  50 mg at bedtime for sleep.  --Discontinue Seroquel  25 mg at bedtime. This is only used for sleep as the recommended oral dose for psychosis is a minimum of 300 mg daily. --Please transfer patient to psychiatric inpatient unit once he is medically stable --Consider TOC/Social worker consult to facilitate inpatient psychiatry transfer.    ## Medical Decision Making Capacity: Not specifically addressed in this encounter  ## Further Work-up:  --  TSH, B12, folate -- most recent EKG on 06/09/2024 had QtC of 430 -- Pertinent labwork reviewed earlier this admission includes: CR-1.32, AST-512, ALT-478   ## Disposition:-- We recommend inpatient psychiatric hospitalization after medical hospitalization. Patient has been involuntarily committed on 06/10/2024.   ## Behavioral / Environmental: -Delirium Precautions: Delirium Interventions for Nursing and Staff: - RN to open blinds every AM. - To Bedside: Glasses, hearing aide, and pt's own shoes. Make available to patients. when possible and encourage use. - Encourage po fluids when appropriate, keep fluids within reach. - OOB to chair with meals. - Passive ROM exercises to all extremities with AM &  PM care. - RN to assess orientation to person, time and place QAM and PRN. - Recommend extended visitation  hours with familiar family/friends as feasible. - Staff to minimize disturbances at night. Turn off television when pt asleep or when not in use.    ## Safety and Observation Level:  - Based on my clinical evaluation, I estimate the patient to be at high risk of self harm in the current setting. - At this time, we recommend  1:1 Observation. This decision is based on my review of the chart including patient's history and current presentation, interview of the patient, mental status examination, and consideration of suicide risk including evaluating suicidal ideation, plan, intent, suicidal or self-harm behaviors, risk factors, and protective factors. This judgment is based on our ability to directly address suicide risk, implement suicide prevention strategies, and develop a safety plan while the patient is in the clinical setting. Please contact our team if there is a concern that risk level has changed.  CSSR Risk Category:C-SSRS RISK CATEGORY: High Risk  Suicide Risk Assessment: Patient has following modifiable risk factors for suicide: active suicidal ideation and CAH with suicidal content, which we are addressing by prescribing medications. Patient has following non-modifiable or demographic risk factors for suicide: male gender and history of suicide attempt Patient has the following protective factors against suicide: Supportive family  Thank you for this consult request. Recommendations have been communicated to the primary team.  We will follow up at this time.   Jan DELENA Donath, MD       History of Present Illness  Patient Report:  Patient seen face to face in his hospital room. He is awake, alert and oriented x 3. Patient reports he overdosed on multiple pills of hydrocodone -acetaminophen  and Seroquel  25 mg tablet due to voices that commanded him to kill himself. Per EMS reports, patient received a prescription of 10 mg 90 tablets of hydrocodone -acetaminophen  3 days prior to this  admission with only 20 tablets remaining in the bottle. There was also an open Seroquel  bottle near patient. Today, patient reports ongoing command auditory hallucinations, hearing multiple, unknown, male and male voices telling him to kill himself. Patient states these people are listening to my thoughts, they can hear me and monitoring what I say. He also reports visual hallucination and paranoid delusion, saying I can see them outside, I think they are coming to harm me, I don't feel safe here. Patient states that he has been hearing voices for a long time but it's been getting worse lately.   On further questioning, patient indicates that he is not currently following up with any psychiatrist however, he receives a prescription of Seroquel  25 mg at bedtime from his PCP but the medication seems not to be working. This Clinical research associate informed patient that Seroquel  25 mg at bedtime is only used off label for sleep and would not help with his psychosis. He needs a higher dose of about 300 mg daily to control psychosis. As a result, patient was advised to switch to Risperidone which would better manage his psychosis and he accepted. Of note, patient denies allergies to Risperidone.     Review of Systems  Psychiatric/Behavioral:  Positive for hallucinations and suicidal ideas. Negative for substance abuse.      Psychiatric and Social History  Psychiatric History:  Information collected from the patient  Prev Dx/Sx: Bipolar disorder and schizophrenia Current Psych Provider: none Home Meds (current): Seroquel  25 mg at bedtime Previous Med Trials: none Therapy: none  Prior Psych Hospitalization: yes, patient states he was hospitalized years ago due to command auditory hallucinations with the plan to hang himself.  Prior Self Harm: yes, attempted to hang himself.  Prior Violence: denies   Family Psych History: patient denies Family Hx suicide: patient denies  Social History:  Educational Hx: high  school Occupational Hx: None, on disability benefit Legal Hx: patient denies Living Situation: lives alone Spiritual Hx: unknown Access to weapons/lethal means: patient denies   Substance History Alcohol: denies  Type of alcohol N/A Last Drink N/A Number of drinks per day N/A History of alcohol withdrawal seizures N/A History of DT's N/A Tobacco: N/A Illicit drugs: patient denies  Prescription drug abuse: patient denies Rehab hx: denies   Exam Findings  Physical Exam:  Vital Signs:  Temp:  [97.3 F (36.3 C)-98.5 F (36.9 C)] 98.5 F (36.9 C) (06/22 1125) Pulse Rate:  [63-100] 76 (06/22 0830) Resp:  [9-24] 13 (06/22 0830) BP: (69-152)/(46-112) 127/87 (06/22 0830) SpO2:  [92 %-100 %] 98 % (06/22 0830) Weight:  [65.9 kg] 65.9 kg (06/21 1601) Blood pressure 127/87, pulse 76, temperature 98.5 F (36.9 C), temperature source Oral, resp. rate 13, weight 65.9 kg, SpO2 98%. Body mass index is 22.09 kg/m.  Physical Exam  Mental Status Exam: General Appearance: Casual  Orientation:  Full (Time, Place, and Person)  Memory:  Immediate;   Good Recent;   Good  Concentration:  Concentration: Fair and Attention Span: Fair  Recall:  Fair  Attention  Fair  Eye Contact:  Poor  Speech:  Clear and Coherent  Language:  Good  Volume:  Normal  Mood: I feel worried  Affect:  Congruent and Constricted  Thought Process:  Coherent  Thought Content:  Hallucinations: Auditory and Paranoid Ideation  Suicidal Thoughts:  Yes.  without intent/plan  Homicidal Thoughts:  No  Judgement:  Fair  Insight:  Fair  Psychomotor Activity:  Normal  Akathisia:  No  Fund of Knowledge:  Good      Assets:  Communication Skills  Cognition:  WNL  ADL's:  Intact  AIMS (if indicated):        Other History   These have been pulled in through the EMR, reviewed, and updated if appropriate.  Family History:  The patient's family history includes Diabetes in his brother and father.  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[facility])     Surgical History: Past Surgical History:  Procedure Laterality Date   AMPUTATION  11/04/2011   Procedure: AMPUTATION DIGIT;  Surgeon: Cordella Glendia Hutchinson;  Location: [facility] OR;  Service: Orthopedics;  Laterality: Right;  Right Second Toe Distal Phalanx Amputation   BUNIONECTOMY     CERVICAL FUSION       Medications:   Current Facility-Administered Medications:    0.9 %  sodium chloride  infusion, 250 mL, Intravenous, Continuous, Kamat, Sunil G, MD   [COMPLETED] acetylcysteine  (ACETADOTE ) 30.5 mg/mL load via infusion 9,885 mg, 150 mg/kg, Intravenous, Once, 9,885 mg at 06/09/24 2228 **FOLLOWED BY** acetylcysteine  (ACETADOTE ) 18,000 mg in dextrose  5 % 590 mL (30.5085 mg/mL) infusion, 15 mg/kg/hr, Intravenous, Continuous, Kamat, Sunil G, MD, Last Rate: 32.4 mL/hr at 06/10/24 0700, 15 mg/kg/hr at 06/10/24 0700   bictegravir-emtricitabine -tenofovir  AF (BIKTARVY) 50-200-25 MG per tablet 1 tablet, 1 tablet, Oral, Daily, Arloa Folks D, NP,  1 tablet at 06/10/24 0932   Chlorhexidine Gluconate Cloth 2 % PADS 6 each, 6 each, Topical, Daily, Dewald, Jonathan B, MD, 6 each at 06/09/24 1600   docusate sodium  (COLACE) capsule 100 mg, 100 mg, Oral, BID PRN, Arloa Folks D, NP   heparin  injection 5,000 Units, 5,000 Units, Subcutaneous, Q8H, Harris, Whitney D, NP, 5,000 Units at 06/10/24 9387   lactated ringers  infusion, , Intravenous, Continuous, Harris, Whitney D, NP, Last Rate: 40 mL/hr at 06/10/24 0700, Infusion Verify at 06/10/24 0700   naloxone  HCl (NARCAN ) 4 mg in dextrose  5 % 250 mL infusion, 0.25 mg/hr, Intravenous, Continuous, Harris, Whitney D, NP, Last Rate: 15.63 mL/hr at 06/10/24 0700, 0.25 mg/hr at 06/10/24 0700   polyethylene glycol (MIRALAX / GLYCOLAX)  packet 17 g, 17 g, Oral, Daily PRN, Arloa Folks D, NP   tamsulosin (FLOMAX) capsule 0.4 mg, 0.4 mg, Oral, Daily, Harris, Whitney D, NP, 0.4 mg at 06/10/24 0932  Allergies: Allergies  Allergen Reactions   Prunus Persica Hives    Only fresh D/T THE FUZZ   Tivicay  [Dolutegravir ] Rash    Tolerated Biktarvy fine 2025.    Gabapentin     Nsaids Itching and Rash   Tramadol Hcl Itching and Rash    Can take Hydrocodone , Oxycodone     Eilyn Polack A Narek Kniss, MD

## 2024-06-10 NOTE — Progress Notes (Signed)
 NAME:  Gary [name], MRN:  [id number], DOB:  Apr 06, 1971, LOS: 1 ADMISSION DATE:  06/09/2024, CONSULTATION DATE:  06/09/2024 REFERRING MD:  Dr. Cottie - EDP, CHIEF COMPLAINT: Intentional drug overdose  History of Present Illness:  Gary [name] is a 53 year old male with a past medical history significant for bipolar disease, schizophrenia, HTN, and HIV who presented to the ED at [facility] via EMS after intentional drug overdose.  Per report patient's spouse called the EMS around 12:15 PM with concern for intentional overdose.  On EMS arrival patient was found with partially empty oxycodone  bottle.  Prescription was for 10 mg 90 tablets with only 20 tablets remaining.  This prescription was filled 3 days prior to admission.  There is also an open Seroquel  bottle near patient but unknown.  Patient was verbally combative with EMS but on ED arrival he was somnolent.  EMS does report the patient appeared to be having auditory hallucinations.  Wife reports that patient has not been sleeping well over the last week with reports of paranoia as well.   On ED arrival patient's vital signs were within normal limit.  Lab work significant for glucose 135, BUN 24, creatinine 2.92, GFR 25.  Given ongoing somnolence decision was made to admit with low-dose Narcan  drip  Pertinent  Medical History  Bipolar disease, schizophrenia, HTN, and HIV   Significant Hospital Events: Including procedures, antibiotic start and stop dates in addition to other pertinent events   6/21 presented with intentional drug overdose.  IVC paperwork completed admitted to ICU with Narcan  drip 6/22, confirmed with spouse and PMP aware patient has written prescription for Percocet.  Poison control involved and Tylenol  level obtained and elevated at 41 NAC protocol started  Interim History / Subjective:  Alert with flat withdrawn affect.  Denies suicidal ideations but reports auditory hallucinations  Objective    Blood pressure  127/87, pulse 76, temperature 98.3 F (36.8 C), temperature source Oral, resp. rate 13, weight 65.9 kg, SpO2 98%.        Intake/Output Summary (Last 24 hours) at 06/10/2024 0850 Last data filed at 06/10/2024 0700 Gross per 24 hour  Intake 1687.56 ml  Output 800 ml  Net 887.56 ml   Filed Weights   06/09/24 1601  Weight: 65.9 kg    Examination: General: Acute thin ill appearing middle-aged male lying in bed in no acute distress HEENT: [place]/AT, MM pink/moist, PERRL,  Neuro: Alert and interactive but flat withdrawn affect CV: s1s2 regular rate and rhythm, no murmur, rubs, or gallops,  PULM: Clear to auscultation bilaterally, no increased work of breathing, no added breath sounds GI: soft, bowel sounds active in all 4 quadrants, non-tender, non-distended, tolerating oral diet Extremities: warm/dry, no edema  Skin: no rashes or lesions  Resolved problem list   Assessment and Plan  Suicide attempt with intentional drug overdose Tylenol  overdose with elevated LFTs - Unknown exact quantity but high suspicion for both oxycodone  and Seroquel  overdose.  Patient confirms this was an intentional overdose with suicide attempt -confirmed with spouse and PMP aware patient has written prescription for Percocet.  Poison control involved and Tylenol  level obtained and elevated at 41 History of bipolar/schizophrenia -Wife reports patient has not been sleeping the last week with auditory hallucinations and paranoia P: Stop low-dose Narcan  drip Psych consult Suicide sitter Continue NAC protocol Poison control following  Acute kidney injury - Creatinine 2.92 with GFR 25 on admission compared to creatinine 1.17 with GFR 76 May 2025 P: Follow renal  function Monitor urine output Trend Bment Avoid hepatotoxins Ensure adequate renal perfusion Encourage oral intake  Essential hypertension - Does not appear patient utilizes any antihypertensives at baseline P: Continuous telemetry  HIV - Home  medications include Biktarvy P: Resume home Statistician (right click and Reselect all SmartList Selections daily)   Diet/type: NPO DVT prophylaxis prophylactic heparin   Pressure ulcer(s): N/A GI prophylaxis: PPI Lines: N/A Foley:  N/A Code Status:  full code Last date of multidisciplinary goals of care discussion: Continue to update patient and family daily  Critical care time:   CRITICAL CARE Performed by: Lasalle Abee D. Harris   Total critical care time: 38 minutes  Critical care time was exclusive of separately billable procedures and treating other patients.  Critical care was necessary to treat or prevent imminent or life-threatening deterioration.  Critical care was time spent personally by me on the following activities: development of treatment plan with patient and/or surrogate as well as nursing, discussions with consultants, evaluation of patient's response to treatment, examination of patient, obtaining history from patient or surrogate, ordering and performing treatments and interventions, ordering and review of laboratory studies, ordering and review of radiographic studies, pulse oximetry and re-evaluation of patient's condition.  Sasha Rueth D. Harris, NP-C [facility] Personal contact information can be found on Amion  If no contact or response made please call [phone] 06/10/2024, 8:50 AM

## 2024-06-10 NOTE — Progress Notes (Signed)
 eLink Physician-Brief Progress Note Patient Name: Gary [name] DOB: 03/29/1971 MRN: [id number]   Date of Service  06/10/2024  HPI/Events of Note  Poison control said we can stop acetadote  per their protocol.   BP 164/101 (119) no PRNs  Pysch and NP notes reviewed. Improving ast/alt.   Tylenol  level < 10 now.   eICU Interventions  Stop aceta dote for now.      Intervention Category Intermediate Interventions: Other:  [name] 06/10/2024, 10:40 PM

## 2024-06-11 DIAGNOSIS — T50902A Poisoning by unspecified drugs, medicaments and biological substances, intentional self-harm, initial encounter: Secondary | ICD-10-CM

## 2024-06-11 DIAGNOSIS — R45851 Suicidal ideations: Secondary | ICD-10-CM

## 2024-06-11 LAB — PHOSPHORUS: Phosphorus: 2.5 mg/dL (ref 2.5–4.6)

## 2024-06-11 LAB — COMPREHENSIVE METABOLIC PANEL WITH GFR
ALT: 378 U/L — ABNORMAL HIGH (ref 0–44)
AST: 347 U/L — ABNORMAL HIGH (ref 15–41)
Albumin: 2.8 g/dL — ABNORMAL LOW (ref 3.5–5.0)
Alkaline Phosphatase: 136 U/L — ABNORMAL HIGH (ref 38–126)
Anion gap: 4 — ABNORMAL LOW (ref 5–15)
BUN: 12 mg/dL (ref 6–20)
CO2: 25 mmol/L (ref 22–32)
Calcium: 9 mg/dL (ref 8.9–10.3)
Chloride: 104 mmol/L (ref 98–111)
Creatinine, Ser: 1.03 mg/dL (ref 0.61–1.24)
GFR, Estimated: >60 mL/min (ref >60)
Glucose, Bld: 118 mg/dL — ABNORMAL HIGH (ref 70–99)
Potassium: 3.9 mmol/L (ref 3.5–5.1)
Sodium: 133 mmol/L — ABNORMAL LOW (ref 135–145)
Total Bilirubin: 0.5 mg/dL (ref 0.0–1.2)
Total Protein: 6.3 g/dL — ABNORMAL LOW (ref 6.5–8.1)

## 2024-06-11 LAB — CBC WITH DIFFERENTIAL/PLATELET
Abs Immature Granulocytes: 0.04 10*3/uL (ref 0.00–0.07)
Basophils Absolute: 0 10*3/uL (ref 0.0–0.1)
Basophils Relative: 0 %
Eosinophils Absolute: 0.3 10*3/uL (ref 0.0–0.5)
Eosinophils Relative: 5 %
HCT: 41.7 % (ref 39.0–52.0)
Hemoglobin: 13.3 g/dL (ref 13.0–17.0)
Immature Granulocytes: 1 %
Lymphocytes Relative: 40 %
Lymphs Abs: 3 10*3/uL (ref 0.7–4.0)
MCH: 23.9 pg — ABNORMAL LOW (ref 26.0–34.0)
MCHC: 31.9 g/dL (ref 30.0–36.0)
MCV: 75 fL — ABNORMAL LOW (ref 80.0–100.0)
Monocytes Absolute: 0.8 10*3/uL (ref 0.1–1.0)
Monocytes Relative: 11 %
Neutro Abs: 3.3 10*3/uL (ref 1.7–7.7)
Neutrophils Relative %: 43 %
Platelets: 211 10*3/uL (ref 150–400)
RBC: 5.56 MIL/uL (ref 4.22–5.81)
RDW: 13.8 % (ref 11.5–15.5)
WBC: 7.6 10*3/uL (ref 4.0–10.5)
nRBC: 0 % (ref 0.0–0.2)

## 2024-06-11 LAB — PROTIME-INR
INR: 1.2 (ref 0.8–1.2)
Prothrombin Time: 15.1 s (ref 11.4–15.2)

## 2024-06-11 LAB — MAGNESIUM: Magnesium: 1.7 mg/dL (ref 1.7–2.4)

## 2024-06-11 MED ORDER — RISPERIDONE 1 MG PO TABS
1.0000 mg | ORAL_TABLET | Freq: Every day | ORAL | Status: DC
  Administered 2024-06-12: 1 mg via ORAL
  Filled 2024-06-11: qty 1

## 2024-06-11 MED ORDER — RISPERIDONE 2 MG PO TABS
2.0000 mg | ORAL_TABLET | Freq: Every day | ORAL | Status: DC
  Administered 2024-06-11: 2 mg via ORAL
  Filled 2024-06-11 (×3): qty 1

## 2024-06-11 MED ORDER — MAGNESIUM SULFATE 2 GM/50ML IV SOLN
2.0000 g | Freq: Once | INTRAVENOUS | Status: AC
  Administered 2024-06-11: 2 g via INTRAVENOUS
  Filled 2024-06-11: qty 50

## 2024-06-11 NOTE — Plan of Care (Signed)

## 2024-06-11 NOTE — Consult Note (Signed)
 [facility] Psychiatric Consult Initial  Patient Name: .Gary [name]  MRN: [id number]  DOB: [birth year]  Consult Order details:  Orders (From admission, onward)     Start     Ordered   06/10/24 0859  IP CONSULT TO PSYCHIATRY       Ordering Provider: Arloa Benton BIRCH, NP  Provider:  (Not yet assigned)  Question Answer Comment  Location [facility]   Reason for Consult? Intentional overdose with suicide attempt, need psych eval      06/10/24 0858             Mode of Visit: In person    Psychiatry Consult Evaluation  Service Date: June 11, 2024 LOS:  LOS: 2 days  Chief Complaint I hear multiple voices commanding me to harm myself.   Primary Psychiatric Diagnoses  Schizoaffective disorder, Bipolar type, in current episode.   Assessment  Gary [name] is a 53 y.o. male admitted: Medicallyfor 06/09/2024  1:09 PM for intentional overdose due to command auditory hallucination. He carries the psychiatric diagnoses of schizoaffective disorder, Bipolar type and has a past medical history of hypertension and HIV. Psychiatry consult was places for psychiatry evaluation for intentional overdose with suicide attempt.   His current presentation of command auditory hallucination with recent overdose, decreased need for sleep, auditory and visual hallucinations, paranoid delusions,thought broadcasting and thought insertion and previous history of Bipolar disorder is most consistent with schizoaffective disorder, Bipolar type.He meets criteria for inpatient admission based on recent overdose, ongoing command auditory hallucination, paranoid delusions and previous suicide attempt by hanging.He is currently at acute risk of harm to himself.  Current outpatient psychotropic medications include Seroquel  25 mg at bedtime and historically he has had a poor response to these medications. He was compliant with medications prior to admission as evidenced by reports from the patient.  On initial examination, patient appears stated age, calm and cooperative but worried about ongoing symptoms. Thought process is linear but preoccupied with ongoing auditory hallucination and paranoid delusion. He endorsed suicide ideation as commanded by the voices. Please see plan below for detailed recommendations.   Diagnoses:  Active Hospital problems: Principal Problem:   Drug overdose Active Problems:   Human immunodeficiency virus (HIV) disease (HCC)   BIPOLAR DISORDER UNSPECIFIED   Essential hypertension, benign   Cocaine dependence, uncomplicated (HCC)    Plan   ## Psychiatric Medication Recommendations:   Patient continues to be in active psychosis (auditory hallucinations yelling/screaming at him during interview, paranoid thought insertion) on 1 mg twice daily of risperidone-we will need a higher neuroleptic medication dose at present. While risperidone is hepatically metabolized and patient's liver status is impaired, the danger a of prolonged, under-treated episode of schizophrenia poses a threat to this patient's current health (it seems to have been the primary motivation for his suicide attempt) as well as his future cognition. He is appropriate for discharge to inpatient psychiatric facility when medically stable.  --Increase risperidone 1 mg BID  ? 1 mg in the AM and 2 mg in the PM for psychosis.  Patient's liver function appears to be improving AST/ALT 389 ? 347/431 ? 378. Will continue to monitor.  --Consider Trazodone  50 mg at bedtime for sleep.  --Patient will need transfer to psychiatric inpatient unit once he is medically stable --Consider TOC/Social worker consult to facilitate inpatient psychiatry transfer.    ## Medical Decision Making Capacity: Not specifically addressed in this encounter  ## Further Work-up:  -- None. -- most recent  EKG on 06/09/2024 had QtC of 430 -- Pertinent labwork reviewed earlier this admission includes: CR-1.32, AST-512,  ALT-478   ## Disposition:-- We recommend inpatient psychiatric hospitalization after medical hospitalization. Patient has been involuntarily committed on 06/10/2024.   ## Behavioral / Environmental: -Delirium Precautions: Delirium Interventions for Nursing and Staff: - RN to open blinds every AM. - To Bedside: Glasses, hearing aide, and pt's own shoes. Make available to patients. when possible and encourage use. - Encourage po fluids when appropriate, keep fluids within reach. - OOB to chair with meals. - Passive ROM exercises to all extremities with AM & PM care. - RN to assess orientation to person, time and place QAM and PRN. - Recommend extended visitation hours with familiar family/friends as feasible. - Staff to minimize disturbances at night. Turn off television when pt asleep or when not in use.    ## Safety and Observation Level:  - Based on my clinical evaluation, I estimate the patient to be at high risk of self harm in the current setting. - At this time, we recommend  1:1 Observation. This decision is based on my review of the chart including patient's history and current presentation, interview of the patient, mental status examination, and consideration of suicide risk including evaluating suicidal ideation, plan, intent, suicidal or self-harm behaviors, risk factors, and protective factors. This judgment is based on our ability to directly address suicide risk, implement suicide prevention strategies, and develop a safety plan while the patient is in the clinical setting. Please contact our team if there is a concern that risk level has changed.  CSSR Risk Category:C-SSRS RISK CATEGORY: High Risk  Suicide Risk Assessment: Patient has following modifiable risk factors for suicide: active suicidal ideation and CAH with suicidal content, which we are addressing by prescribing medications. Patient has following non-modifiable or demographic risk factors for suicide: male gender and history  of suicide attempt Patient has the following protective factors against suicide: Supportive family  Thank you for this consult request. Recommendations have been communicated to the primary team.  We will follow up at this time.   Meylin Stenzel, MD       History of Present Illness  Patient Report:   This morning, patient is floridly psychotic and responding to internal stimuli in the room.  However, patient is alert and amenable to interview.  Is able to maintain a conversation, but it is difficult as the voices in my head are yelling and screaming.  They appear to be laughing at him and putting him down.  Occasionally responds to these voices during interview.  Patient says that he had voices before, but that they worsened approximately 1 year ago and that no medication has helped with this.  Denies command auditory hallucinations at this time, as he cannot understand them.  Also appears to be a paranoid thought insertion component -- says that actual people can use computers to put voices in his head.  Only got 2 hours of sleep last night.  Attributed to suicide attempt as a way to get away from the voices.  Denies suicidal ideation at present.  Endorses vague homicidal ideation towards the people who are putting the voices in his head.   Review of Systems  Psychiatric/Behavioral:  Positive for hallucinations and suicidal ideas. Negative for substance abuse.      Psychiatric and Social History  Psychiatric History:  Information collected from the patient  Prev Dx/Sx: Bipolar disorder and schizophrenia Current Psych Provider: none Home Meds (current): Seroquel   25 mg at bedtime Previous Med Trials: none Therapy: none  Prior Psych Hospitalization: yes, patient states he was hospitalized years ago due to command auditory hallucinations with the plan to hang himself.  Prior Self Harm: yes, attempted to hang himself.  Prior Violence: denies   Family Psych History: patient  denies Family Hx suicide: patient denies  Social History:  Educational Hx: high school Occupational Hx: None, on disability benefit Legal Hx: patient denies Living Situation: lives alone Spiritual Hx: unknown Access to weapons/lethal means: patient denies   Substance History Alcohol: denies  Type of alcohol N/A Last Drink N/A Number of drinks per day N/A History of alcohol withdrawal seizures N/A History of DT's N/A Tobacco: N/A Illicit drugs: patient denies  Prescription drug abuse: patient denies Rehab hx: denies   Exam Findings  Physical Exam:  Vital Signs:  Temp:  [98.5 F (36.9 C)-99.4 F (37.4 C)] 99.4 F (37.4 C) (06/23 0811) Pulse Rate:  [80-102] 98 (06/23 0811) Resp:  [11-23] 16 (06/23 0811) BP: (104-152)/(78-108) 152/94 (06/23 0811) SpO2:  [92 %-100 %] 93 % (06/23 0811) Weight:  [66.7 kg] 66.7 kg (06/23 0500) Blood pressure (!) 152/94, pulse 98, temperature 99.4 F (37.4 C), temperature source Oral, resp. rate 16, weight 66.7 kg, SpO2 93%. Body mass index is 22.36 kg/m.  Physical Exam  Mental Status Exam: General Appearance: Casual, sitting up in bed  Orientation:  Full (Time, Place, and Person)  Memory:  Immediate;   Good Recent;   Good  Concentration:  Concentration: Fair and Attention Span: Fair  Recall:  Fair  Attention  Fair  Eye Contact:  Poor  Speech:  Clear and Coherent  Language:  Good  Volume:  Normal  Mood: So-so, better than yesterday  Affect:  Congruent and Constricted  Thought Process:  Coherent  Thought Content:  Hallucinations: Auditory and Paranoid Ideation  Suicidal Thoughts: Denies today  Homicidal Thoughts:  No  Judgement:  Fair  Insight:  Fair  Psychomotor Activity:  Normal  Akathisia:  No  Fund of Knowledge:  Good      Assets:  Communication Skills  Cognition:  WNL  ADL's:  Intact  AIMS (if indicated):        Other History   These have been pulled in through the EMR, reviewed, and updated if appropriate.   Family History:  The patient's family history includes Diabetes in his brother and father.  Medical History: Past Medical History:  Diagnosis Date   Bipolar disorder (HCC)    HIV (human immunodeficiency virus infection) (HCC)    HIV (human immunodeficiency virus infection) (HCC)    Hypertension    Immune deficiency disorder [facility])     Surgical History: Past Surgical History:  Procedure Laterality Date   AMPUTATION  11/04/2011   Procedure: AMPUTATION DIGIT;  Surgeon: Cordella Glendia Hutchinson;  Location: [facility] OR;  Service: Orthopedics;  Laterality: Right;  Right Second Toe Distal Phalanx Amputation   BUNIONECTOMY     CERVICAL FUSION       Medications:   Current Facility-Administered Medications:    bictegravir-emtricitabine -tenofovir  AF (BIKTARVY) 50-200-25 MG per tablet 1 tablet, 1 tablet, Oral, Daily, Harris, Whitney D, NP, 1 tablet at 06/10/24 0932   Chlorhexidine Gluconate Cloth 2 % PADS 6 each, 6 each, Topical, Daily, Dewald, Jonathan B, MD, 6 each at 06/09/24 1600   docusate sodium  (COLACE) capsule 100 mg, 100 mg, Oral, BID PRN, Arloa, Whitney D, NP   heparin  injection 5,000 Units, 5,000 Units, Subcutaneous, Q8H, Harris, Whitney D,  NP, 5,000 Units at 06/11/24 0526   oxyCODONE  (Oxy IR/ROXICODONE ) immediate release tablet 10 mg, 10 mg, Oral, Q6H PRN, Arloa Folks D, NP, 10 mg at 06/10/24 1805   polyethylene glycol (MIRALAX / GLYCOLAX) packet 17 g, 17 g, Oral, Daily PRN, Harris, Whitney D, NP   risperiDONE (RISPERDAL) tablet 1 mg, 1 mg, Oral, BID, Harris, Whitney D, NP, 1 mg at 06/10/24 2107   tamsulosin (FLOMAX) capsule 0.4 mg, 0.4 mg, Oral, Daily, Harris, Whitney D, NP, 0.4 mg at 06/10/24 0932  Allergies: Allergies  Allergen Reactions   Prunus Persica Hives    Only fresh D/T THE FUZZ   Tivicay  [Dolutegravir ] Rash    Tolerated Biktarvy fine 2025.    Gabapentin     Nsaids Itching and Rash   Tramadol Hcl Itching and Rash    Can take Hydrocodone , Oxycodone     Lashandra Arauz, MD

## 2024-06-11 NOTE — Progress Notes (Addendum)
 [facility] PROGRESS NOTE    Progress Note  Gary [name]  FMW:[id number] DOB: [birth year] DOA: 06/09/2024 PCP: Pcp, No     Brief Narrative:   Gary [name] is an 53 y.o. male past medical history significant for bipolar disease, schizophrenia HIV comes into the ED after an intentional suicidal drug overdose, when EMS got to the home and empty bottle of oxycodone  90 tablets of 10 mg was emptied, there was also an open bottle of Seroquel , admitted to the ICU started on bicarb drip with improvement in mentation.  Poison control was consulted and he was started on the NAC protocol.  Significant Events: 6/21 presented with intentional drug overdose.  IVC paperwork completed admitted to ICU with Narcan  drip 6/22, confirmed with spouse and PMP aware patient has written prescription for Percocet.  Poison control involved and Tylenol  level obtained and elevated at 41 NAC protocol started  Assessment/Plan:   Intentional suicide attempt drug overdose Unknown quantity or drug but highly suspicious for mL oxycodone  and Seroquel . Tylenol  level was elevated 41 was also started on NAC. On admission he was a started on a Narcan  drip with improvement  Acetaminophen  toxicity/elevated LFTs: Acetaminophen  level on admission was 41, Poison control was consulted, they recommended starting the prep NAC protocol. LFTs are elevated but appear decreasing compared to admission.  History of bipolar disorder schizophrenia: Psych was consulted. Suicide sitter at bedside. Continue NAC protocol  Acute kidney injury/Metabolic acidosis: With baseline creatinine around 1.1, admission 2.9. Started on aggressive IV fluid hydration creatinine is improved to 1.0. Acidosis now resolved.  Essential hypertension: Noted hold all antihypertensive medication.  Human immunodeficiency virus (HIV) disease (HCC) Continue current regimen.  BIPOLAR DISORDER UNSPECIFIED Consult psychiatry  DVT prophylaxis:  lovenox  Family Communication:none Status is: Inpatient Remains inpatient appropriate because: Intentional drug overdose   Code Status:     Code Status Orders  (From admission, onward)           Start     Ordered   06/09/24 1459  Full code  Continuous       Question:  By:  Answer:  Consent: discussion documented in EHR   06/09/24 1507           Code Status History     Date Active Date Inactive Code Status Order ID Comments User Context   11/02/2011 2042 11/09/2011 1942 Full Code [id number]  Dorethea Rocky NOVAK, RN Inpatient         IV Access:   Peripheral IV   Procedures and diagnostic studies:   No results found.   Medical Consultants:   None.   Subjective:    Gary [name] no complaints not in his good attitude this morning  Objective:    Vitals:   06/10/24 2200 06/11/24 0000 06/11/24 0400 06/11/24 0500  BP: (!) 139/100 (!) 145/108 139/87   Pulse: 92 96 87   Resp: 16 15 15    Temp:  99.3 F (37.4 C) 99.4 F (37.4 C)   TempSrc:  Oral Oral   SpO2: 92% 92% 94%   Weight:    66.7 kg   SpO2: 94 % O2 Flow Rate (L/min): 1 L/min   Intake/Output Summary (Last 24 hours) at 06/11/2024 0653 Last data filed at 06/11/2024 9386 Gross per 24 hour  Intake 1594.74 ml  Output 3080 ml  Net -1485.26 ml   Filed Weights   06/09/24 1601 06/11/24 0500  Weight: 65.9 kg 66.7 kg    Exam: General exam: In no acute  distress. Respiratory system: Good air movement and clear to auscultation. Cardiovascular system: S1 & S2 heard, RRR. No JVD.SABRA  Gastrointestinal system: Abdomen is nondistended, soft and nontender.  Extremities: No pedal edema. Skin: No rashes, lesions or ulcers Psychiatry: Judgement and insight appear normal. Mood & affect appropriate.   Data Reviewed:    Labs: Basic Metabolic Panel: Recent Labs  Lab 06/09/24 1314 06/09/24 2152 06/10/24 0906 06/11/24 0412  NA 143 138 139 133*  K 3.8 5.2* 4.3 3.9  CL 104 109 107 104  CO2 19* 18* 24 25   GLUCOSE 135* 117* 103* 118*  BUN 24* 24* 13 12  CREATININE 2.92* 2.02* 1.32* 1.03  CALCIUM 8.6* 8.3* 8.7* 9.0  MG 2.0  --  1.9 1.7  PHOS  --   --  2.5 2.5   GFR Estimated Creatinine Clearance: 80 mL/min (by C-G formula based on SCr of 1.03 mg/dL). Liver Function Tests: Recent Labs  Lab 06/09/24 2152 06/10/24 1201 06/10/24 2043 06/11/24 0412  AST 512* 394* 389* 347*  ALT 478* 430* 431* 378*  ALKPHOS 126 115 131* 136*  BILITOT 0.7 0.8 0.6 0.5  PROT 6.5 6.1* 6.7 6.3*  ALBUMIN 2.9* 2.5* 2.8* 2.8*   No results for input(s): LIPASE, AMYLASE in the last 168 hours. No results for input(s): AMMONIA in the last 168 hours. Coagulation profile Recent Labs  Lab 06/09/24 2152 06/10/24 1201 06/10/24 2043 06/11/24 0412  INR 1.3* 1.3* 1.2 1.2   COVID-19 Labs  No results for input(s): DDIMER, FERRITIN, LDH, CRP in the last 72 hours.  No results found for: SARSCOV2NAA  CBC: Recent Labs  Lab 06/09/24 1314 06/10/24 0906 06/11/24 0412  WBC 9.0 8.7 7.6  NEUTROABS 6.4  --  3.3  HGB 15.6 13.9 13.3  HCT 51.9 43.8 41.7  MCV 78.6* 75.3* 75.0*  PLT 246 207 211   Cardiac Enzymes: No results for input(s): CKTOTAL, CKMB, CKMBINDEX, TROPONINI in the last 168 hours. BNP (last 3 results) No results for input(s): PROBNP in the last 8760 hours. CBG: Recent Labs  Lab 06/09/24 1604 06/09/24 2318 06/10/24 0611 06/10/24 1207 06/10/24 1539  GLUCAP 101* 145* 136* 117* 114*   D-Dimer: No results for input(s): DDIMER in the last 72 hours. Hgb A1c: No results for input(s): HGBA1C in the last 72 hours. Lipid Profile: No results for input(s): CHOL, HDL, LDLCALC, TRIG, CHOLHDL, LDLDIRECT in the last 72 hours. Thyroid function studies: No results for input(s): TSH, T4TOTAL, T3FREE, THYROIDAB in the last 72 hours.  Invalid input(s): FREET3 Anemia work up: No results for input(s): VITAMINB12, FOLATE, FERRITIN, TIBC, IRON,  RETICCTPCT in the last 72 hours. Sepsis Labs: Recent Labs  Lab 06/09/24 1314 06/10/24 0906 06/11/24 0412  WBC 9.0 8.7 7.6   Microbiology Recent Results (from the past 240 hours)  MRSA Next Gen by PCR, Nasal     Status: None   Collection Time: 06/09/24  3:07 PM   Specimen: Nasal Mucosa; Nasal Swab  Result Value Ref Range Status   MRSA by PCR Next Gen NOT DETECTED NOT DETECTED Final    Comment: (NOTE) The GeneXpert MRSA Assay (FDA approved for NASAL specimens only), is one component of a comprehensive MRSA colonization surveillance program. It is not intended to diagnose MRSA infection nor to guide or monitor treatment for MRSA infections. Test performance is not FDA approved in patients less than 30 years old. Performed at [facility], [address] [place], [place] [address]      Medications:  bictegravir-emtricitabine -tenofovir  AF  1 tablet Oral Daily   Chlorhexidine Gluconate Cloth  6 each Topical Daily   heparin   5,000 Units Subcutaneous Q8H   risperiDONE  1 mg Oral BID   tamsulosin  0.4 mg Oral Daily   Continuous Infusions:  magnesium  sulfate bolus IVPB 2 g (06/11/24 0611)      LOS: 2 days   Gary [name]  [facility]  06/11/2024, 6:53 AM

## 2024-06-11 NOTE — Plan of Care (Signed)
  Problem: Education: Goal: Knowledge of General Education information will improve Description: Including pain rating scale, medication(s)/side effects and non-pharmacologic comfort measures Outcome: Progressing   Problem: Health Behavior/Discharge Planning: Goal: Ability to manage health-related needs will improve Outcome: Progressing   Problem: Clinical Measurements: Goal: Respiratory complications will improve Outcome: Progressing   Problem: Clinical Measurements: Goal: Cardiovascular complication will be avoided Outcome: Progressing   Problem: Activity: Goal: Risk for activity intolerance will decrease Outcome: Progressing   Problem: Nutrition: Goal: Adequate nutrition will be maintained Outcome: Progressing   Problem: Pain Managment: Goal: General experience of comfort will improve and/or be controlled Outcome: Progressing

## 2024-06-11 NOTE — TOC Initial Note (Addendum)
 Transition of Care [facility]) - Initial/Assessment Note    Patient Details  Name: Gary [name] MRN: [id number] Date of Birth: 12/31/1970  Transition of Care [facility]) CM/SW Contact:    Lauraine FORBES Saa, LCSW Phone Number: 06/11/2024, 3:28 PM  Clinical Narrative:                  3:28 PM Per chart review, Psychiatry team recommended patient to discharge to IP psychiatric hospital/until. Patient remains under IVC. CSW sent IP psychiatry referrals in efforts to obtain a SNF bed. Per hospitalist, patient is expected to be medically ready to discharge tomorrow. CSW consulted [facility] for additional assistance regarding IP psychiatry bed availability.  4:50 PM CSW received IP Psychiatry bed offer from [facility] (receiving physician: Dr. Ilsa; [place] [facility]W; [phone]). CSW informed medical team of bed offer.  Expected Discharge Plan: Psychiatric Hospital Barriers to Discharge: Continued Medical Work up, Psych Bed not available   Patient Goals and CMS Choice            Expected Discharge Plan and Services In-house Referral: Clinical Social Work   Post Acute Care Choice:  [facility]) Living arrangements for the past 2 months: Apartment                                      Prior Living Arrangements/Services Living arrangements for the past 2 months: Apartment Lives with:: Spouse Patient language and need for interpreter reviewed:: Yes              Criminal Activity/Legal Involvement Pertinent to Current Situation/Hospitalization: No - Comment as needed  Activities of Daily Living      Permission Sought/Granted Permission sought to share information with : Family Supports, Oceanographer granted to share information with : No (Contact information on chart)  Share Information with NAME: Gary [name]  Permission granted to share info w AGENCY: Psychiatric Hospital  Permission granted to share info w Relationship:  Spouse  Permission granted to share info w Contact Information: [phone]  Emotional Assessment       Orientation: : Oriented to Self, Oriented to Place, Oriented to  Time, Oriented to Situation Alcohol / Substance Use: Not Applicable Psych Involvement: Yes (comment)  Admission diagnosis:  Suicidal ideation [R45.851] Drug overdose [T50.901A] Intentional drug overdose, initial encounter (HCC) [T50.902A] Patient Active Problem List   Diagnosis Date Noted   Drug overdose 06/09/2024   Screening for STDs (sexually transmitted diseases) 05/01/2024   History of substance use disorder 05/01/2024   Encounter to discuss test results 05/01/2024   HIV disease (HCC) 04/30/2024   Medication monitoring encounter 04/30/2024   Health care maintenance 04/30/2024   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   Severe episode of recurrent major depressive disorder, 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/06/2022   Tetrahydrocannabinol (THC) use disorder, moderate, dependence (HCC) 04/05/2022   Lumbar disc disease with radiculopathy 09/03/2019   Hepatitis B immune 11/15/2017   Alcohol use disorder 05/29/2016   Cocaine dependence, uncomplicated (HCC) 05/29/2016   Tetrahydrocannabinol (THC) use disorder, mild, abuse 05/29/2016   Lymphadenopathy, supraclavicular 03/03/2016   Cervical radiculopathy 09/20/2013   Chronic pain syndrome 09/20/2013   Neck pain 09/20/2013   Smoking 10/06/2011   Encounter for long-term (current) use of other medications 04/19/2011   Human immunodeficiency virus (HIV) disease (HCC) 11/26/2009   HERPES ZOSTER, HX OF 11/26/2009   SICKLE-CELL TRAIT 09/25/2009   BIPOLAR DISORDER UNSPECIFIED 09/25/2009  Essential hypertension, benign 09/25/2009   DISC DISEASE, CERVICAL 09/25/2009   Sickle cell trait (HCC) 09/25/2009   PCP:  [facility], [address] Pharmacy:   [facility] [phone] - [place], [place] - [address] AT [facility] OF [address] [place] [address] Phone: [phone] Fax: [phone]     Social Drivers of Health (SDOH) Social History: SDOH Screenings   Food Insecurity: No Food Insecurity (05/09/2024)   Received from [employer]  Housing: Low Risk  (05/09/2024)   Received from [facility]  Recent Concern: Housing - High Risk (02/17/2024)   Received from [facility]  Transportation Needs: No Transportation Needs (05/09/2024)   Received from [facility]  Utilities: Not At Risk (05/09/2024)   Received from [facility]  Recent Concern: Utilities - Medium Risk (02/17/2024)   Received from [facility]  Depression (PHQ2-9): Medium Risk (04/30/2024)  Financial Resource Strain: Low Risk  (05/09/2024)   Received from [facility]  Social Connections: Unknown (05/03/2022)   Received from [facility]  Tobacco Use: High Risk (06/06/2024)   Received from [facility]   SDOH Interventions:     Readmission Risk Interventions     No data to display

## 2024-06-11 NOTE — Progress Notes (Signed)
 [facility] ADULT ICU REPLACEMENT PROTOCOL   The patient does apply for the [facility] Adult ICU Electrolyte Replacment Protocol based on the criteria listed below:   1.Exclusion criteria: TCTS, ECMO, Dialysis, and Myasthenia Gravis patients 2. Is GFR >/= 30 ml/min? Yes.    Patient's GFR today is >60 3. Is SCr </= 2? Yes.   Patient's SCr is 1.03 mg/dL 4. Did SCr increase >/= 0.5 in 24 hours? No. 5.Pt's weight >40kg  Yes.   6. Abnormal electrolyte(s): Mag  7. Electrolytes replaced per protocol 8.  Call MD STAT for K+ </= 2.5, Phos </= 1, or Mag </= 1 Physician:  Rober Hunter BRAVO [facility] 06/11/2024 5:54 AM

## 2024-06-12 DIAGNOSIS — T50902A Poisoning by unspecified drugs, medicaments and biological substances, intentional self-harm, initial encounter: Secondary | ICD-10-CM | POA: Diagnosis not present

## 2024-06-12 DIAGNOSIS — R45851 Suicidal ideations: Secondary | ICD-10-CM | POA: Diagnosis not present

## 2024-06-12 MED ORDER — RISPERIDONE 1 MG PO TABS: ORAL_TABLET | ORAL | Status: AC

## 2024-06-12 NOTE — Consult Note (Signed)
 [facility] Psychiatric Consult Initial  Patient Name: .Gary [name]  MRN: [id number]  DOB: Jun 01, 1971  Consult Order details:  Orders (From admission, onward)     Start     Ordered   06/10/24 0859  IP CONSULT TO PSYCHIATRY       Ordering Provider: Arloa Benton BIRCH, NP  Provider:  (Not yet assigned)  Question Answer Comment  Location [facility]   Reason for Consult? Intentional overdose with suicide attempt, need psych eval      06/10/24 0858             Mode of Visit: In person    Psychiatry Consult Evaluation  Service Date: June 12, 2024 LOS:  LOS: 3 days  Chief Complaint I hear multiple voices commanding me to harm myself.   Primary Psychiatric Diagnoses  Schizoaffective disorder, Bipolar type, in current episode.   Assessment  Gary [name] is a 53 y.o. male admitted: Medicallyfor 06/09/2024  1:09 PM for intentional overdose due to command auditory hallucination. He carries the psychiatric diagnoses of schizoaffective disorder, Bipolar type and has a past medical history of hypertension and HIV. Psychiatry consult was places for psychiatry evaluation for intentional overdose with suicide attempt.   His current presentation of command auditory hallucination with recent overdose, auditory and visual hallucinations, paranoid delusions, thought broadcasting and thought insertion and previous history of Bipolar disorder is most consistent with schizoaffective disorder, Bipolar type. He meets criteria for inpatient admission based on recent overdose, ongoing auditory hallucinations, paranoid delusions, and ongoing SI with previous suicide attempt by hanging. He is currently at acute risk of harm to himself. Current outpatient psychotropic medications include Seroquel  25 mg at bedtime and historically he has had a poor response to these medications. He was compliant with medications prior to admission as evidenced by reports from the patient. On initial  examination, patient appears stated age, calm and cooperative but worried about ongoing symptoms. Thought process is linear but he continues to be preoccupied with ongoing auditory hallucination and paranoid delusions. He continues to endorse suicide ideation to get away from the voices. Please see plan below for detailed recommendations.   Diagnoses:  Active Hospital problems: Principal Problem:   Drug overdose Active Problems:   Human immunodeficiency virus (HIV) disease (HCC)   BIPOLAR DISORDER UNSPECIFIED   Essential hypertension, benign   Cocaine dependence, uncomplicated (HCC)    Plan   ## Psychiatric Medication Recommendations:   Patient continues to be in active psychosis (auditory hallucinations bothering him during interview, paranoid thought insertion), on day 2 of risperidone 1 mg every day and 2 mg QHS, of note patient has not yet received AM dose on interview.  While risperidone is hepatically metabolized and patient's liver status is impaired, the danger of a prolonged, under-treated episode of schizophrenia poses a threat to this patient's current health (it seems to have been the primary motivation for his suicide attempt) as well as his future cognition. He is appropriate for discharge to inpatient psychiatric facility today. Noted fleeting passive suicidal ideation (flashes of plan in his head) and WOULD NOT contract for safety. Is appropriate for inpatient psychiatric transfer and stabilization.   --Will continue 1:1 observation before discharge in setting of continuing suicidal ideation.  --Continue Risperidone 1 mg in the AM and 2 mg in the PM for psychosis.  --Consider Trazodone  50 mg at bedtime for sleep.  --Patient is being discharged today and transferring to [facility] for inpatient psychiatric evaluation and monitoring. --Psychiatry  will sign off at this time.   ## Medical Decision Making Capacity: Not specifically addressed in this encounter  ## Further  Work-up:  -- None. -- most recent EKG on 06/09/2024 had QtC of 430 -- Pertinent labwork reviewed earlier this admission includes: CR-1.32, AST-512, ALT-478   ## Disposition:-- We recommend inpatient psychiatric hospitalization after medical hospitalization. Patient has been involuntarily committed on 06/10/2024.   ## Behavioral / Environmental: -Delirium Precautions: Delirium Interventions for Nursing and Staff: - RN to open blinds every AM. - To Bedside: Glasses, hearing aide, and pt's own shoes. Make available to patients. when possible and encourage use. - Encourage po fluids when appropriate, keep fluids within reach. - OOB to chair with meals. - Passive ROM exercises to all extremities with AM & PM care. - RN to assess orientation to person, time and place QAM and PRN. - Recommend extended visitation hours with familiar family/friends as feasible. - Staff to minimize disturbances at night. Turn off television when pt asleep or when not in use.    ## Safety and Observation Level:  - Based on my clinical evaluation, I estimate the patient to be at high risk of self harm in the current setting. - At this time, we recommend  1:1 Observation. This decision is based on my review of the chart including patient's history and current presentation, interview of the patient, mental status examination, and consideration of suicide risk including evaluating suicidal ideation, plan, intent, suicidal or self-harm behaviors, risk factors, and protective factors. This judgment is based on our ability to directly address suicide risk, implement suicide prevention strategies, and develop a safety plan while the patient is in the clinical setting. Please contact our team if there is a concern that risk level has changed.  CSSR Risk Category:C-SSRS RISK CATEGORY: High Risk  Suicide Risk Assessment: Patient has following modifiable risk factors for suicide: active suicidal ideation and CAH with suicidal content,  which we are addressing by prescribing medications. Patient has following non-modifiable or demographic risk factors for suicide: male gender and history of suicide attempt Patient has the following protective factors against suicide: Supportive family  Thank you for this consult request. Recommendations have been communicated to the primary team.  We sign off at this time.   Duwaine JINNY Amber, Medical Student       History of Present Illness  Patient Report:   This morning, patient is floridly psychotic, and he denies any changes to his auditory hallucinations since yesterday, though appears less anxious than yesterday. Reports sleeping well last night, but mood is not good, the same as before. The patient is alert and amenable to interview.  Is able to maintain a conversation, but occasionally pauses to silently respond to internal stimuli-- notes they are still saying the same thing to him. Yesterday he reported that they were laughing at him and putting him down. Denied command auditory hallucinations yesterday, as he could not understand them.  Also appeared to have paranoid thought insertion component -- says that actual people can use computers to put voices in his head.   Had previously attributed to suicide attempt as a way to get away from the voices, when asked about SI, he reports he had thoughts about killing himself, and would see flashes of ways to carry out suicide in his head. Continues to endorse vague homicidal ideation towards the people who are putting the voices in his head. Denies VH. Denies side effects of risperidone.    Review of Systems  Psychiatric/Behavioral:  Positive for hallucinations and suicidal ideas. Negative for substance abuse.      Psychiatric and Social History  Psychiatric History:  Information collected from the patient  Prev Dx/Sx: Bipolar disorder and schizophrenia Current Psych Provider: none Home Meds (current): Seroquel  25 mg at  bedtime Previous Med Trials: none Therapy: none  Prior Psych Hospitalization: yes, patient states he was hospitalized years ago due to command auditory hallucinations with the plan to hang himself.  Prior Self Harm: yes, attempted to hang himself.  Prior Violence: denies   Family Psych History: patient denies Family Hx suicide: patient denies  Social History:  Educational Hx: high school Occupational Hx: None, on disability benefit Legal Hx: patient denies Living Situation: lives alone Spiritual Hx: unknown Access to weapons/lethal means: patient denies   Substance History Alcohol: denies  Type of alcohol N/A Last Drink N/A Number of drinks per day N/A History of alcohol withdrawal seizures N/A History of DT's N/A Tobacco: N/A Illicit drugs: patient denies  Prescription drug abuse: patient denies Rehab hx: denies   Exam Findings  Physical Exam:  Vital Signs:  Temp:  [98 F (36.7 C)-98.4 F (36.9 C)] 98.1 F (36.7 C) (06/24 0559) Pulse Rate:  [81-103] 81 (06/24 0559) Resp:  [15-20] 16 (06/24 0559) BP: (104-152)/(68-99) 104/74 (06/24 0559) SpO2:  [91 %-97 %] 95 % (06/24 0559) Weight:  [71.6 kg] 71.6 kg (06/24 0559) Blood pressure 104/74, pulse 81, temperature 98.1 F (36.7 C), resp. rate 16, weight 71.6 kg, SpO2 95%. Body mass index is 24 kg/m.  Physical Exam Vitals and nursing note reviewed.  HENT:     Head: Normocephalic and atraumatic.   Eyes:     Extraocular Movements: Extraocular movements intact.   Pulmonary:     Effort: Pulmonary effort is normal.     Breath sounds: Normal breath sounds.   Skin:    General: Skin is warm and dry.   Neurological:     General: No focal deficit present.     Mental Status: He is alert.     Mental Status Exam: General Appearance: Casual, laying flat in bed, appears stated age   Orientation:  Other:  grossly intact  Memory:  grossly intact  Concentration:  Concentration: Fair and Attention Span: Fair  Recall:   Fair  Attention  Fair  Eye Contact: variable, sometimes poor   Speech:  Clear and Coherent  Language:  Good  Volume:  Normal  Mood: not good, same as yesterday  Affect:  Congruent and Constricted, anxious  Thought Process:  Coherent  Thought Content:  Delusions, Hallucinations: Auditory, and Paranoid Ideation  Suicidal Thoughts: Reports passive thoughts of killing himself, without intent, though sees flashes of plans in his head at times.   Homicidal Thoughts:  No  Judgement:  Fair  Insight:  Fair  Psychomotor Activity:  Normal  Akathisia:  No  Fund of Knowledge:  NA      Assets:  Communication Skills  Cognition:  WNL  ADL's:  Intact  AIMS (if indicated):        Other History   These have been pulled in through the EMR, reviewed, and updated if appropriate.  Family History:  The patient's family history includes Diabetes in his brother and father.  Medical History: Past Medical History:  Diagnosis Date   Bipolar disorder (HCC)    HIV (human immunodeficiency virus infection) (HCC)    HIV (human immunodeficiency virus infection) (HCC)    Hypertension    Immune deficiency disorder (HCC)  Surgical History: Past Surgical History:  Procedure Laterality Date   AMPUTATION  11/04/2011   Procedure: AMPUTATION DIGIT;  Surgeon: Cordella Glendia Hutchinson;  [facility];  Service: Orthopedics;  Laterality: Right;  Right Second Toe Distal Phalanx Amputation   BUNIONECTOMY     CERVICAL FUSION       Medications:   Current Facility-Administered Medications:    bictegravir-emtricitabine -tenofovir  AF (BIKTARVY) 50-200-25 MG per tablet 1 tablet, 1 tablet, Oral, Daily, Harris, Whitney D, NP, 1 tablet at 06/11/24 0951   Chlorhexidine Gluconate Cloth 2 % PADS 6 each, 6 each, Topical, Daily, Dewald, Jonathan B, MD, 6 each at 06/11/24 1416   docusate sodium  (COLACE) capsule 100 mg, 100 mg, Oral, BID PRN, Arloa Folks D, NP   heparin  injection 5,000 Units, 5,000 Units,  Subcutaneous, Q8H, Harris, Whitney D, NP, 5,000 Units at 06/12/24 9474   oxyCODONE  (Oxy IR/ROXICODONE ) immediate release tablet 10 mg, 10 mg, Oral, Q6H PRN, Harris, Whitney D, NP, 10 mg at 06/11/24 2106   polyethylene glycol (MIRALAX / GLYCOLAX) packet 17 g, 17 g, Oral, Daily PRN, Arloa Folks D, NP, 17 g at 06/11/24 2159   risperiDONE (RISPERDAL) tablet 1 mg, 1 mg, Oral, Daily **AND** risperiDONE (RISPERDAL) tablet 2 mg, 2 mg, Oral, QHS, Rollene Katz, MD, 2 mg at 06/11/24 2159   tamsulosin (FLOMAX) capsule 0.4 mg, 0.4 mg, Oral, Daily, Harris, Whitney D, NP, 0.4 mg at 06/11/24 0951  Allergies: Allergies  Allergen Reactions   Prunus Persica Hives    Only fresh D/T THE FUZZ   Tivicay  [Dolutegravir ] Rash    Tolerated Biktarvy fine 2025.    Gabapentin     Nsaids Itching and Rash   Tramadol Hcl Itching and Rash    Can take Hydrocodone , Oxycodone     This is a Psychologist, occupational Note.  The care of the patient was discussed with Dr. Larina and the assessment and plan was formulated with their assistance.  Please see their note for official documentation of the patient encounter.   Signed: Duwaine JINNY Amber, Medical Student  I agree with the excellent medical student note as above and have made minor modifications as appropriate.  Odis Rollene, MD [facility] Psychiatry Residency, PGY-1

## 2024-06-12 NOTE — Progress Notes (Signed)
 Pt discharged in safe and stable condition. IV sites discontinued, report called to receiving RN. Pt left in paper scrubs and transporting officers given pt's other clothing.

## 2024-06-12 NOTE — TOC Transition Note (Signed)
 Transition of Care [facility]) - Discharge Note   Patient Details  Name: Gary [name] MRN: [id number] Date of Birth: Jun 07, 1971  Transition of Care [facility]) CM/SW Contact:  Lauraine FORBES Saa, LCSW Phone Number: 06/12/2024, 11:44 AM   Clinical Narrative:     Patient will DC to: [facility] IP Psychiatry Anticipated DC date: 06/12/2024 Family notified: Alika Saladin; Spouse; [phone] Transport by: [place] Department   Per MD patient ready for DC to [facility]. RN to call report prior to discharge [phone]). RN, patient, patient's family, and facility notified of DC. Discharge Summary sent to facility. [facility] Department Transport requested for patient at 11:40.   CSW will sign off for now as social work intervention is no longer needed. Please consult us  again if new needs arise.    Final next level of care: Psychiatric Hospital Barriers to Discharge: Barriers Resolved   Patient Goals and CMS Choice            Discharge Placement              Patient chooses bed at: Other - please specify in the comment section below: ([facility]) Patient to be transferred to facility by: [employer] Department Name of family member notified: Lyndol Vanderheiden; Spouse; [phone] Patient and family notified of of transfer: 06/12/24  Discharge Plan and Services Additional resources added to the After Visit Summary for   In-house Referral: Clinical Social Work   Post Acute Care Choice:  [facility])                               Social Drivers of Health (SDOH) Interventions SDOH Screenings   Food Insecurity: No Food Insecurity (06/11/2024)  Housing: Low Risk  (06/11/2024)  Transportation Needs: No Transportation Needs (06/11/2024)  Utilities: Not At Risk (06/11/2024)  Depression (PHQ2-9): Medium Risk (04/30/2024)  Financial Resource Strain: Low Risk  (05/09/2024)   Received from [facility]  Social Connections: Unknown (05/03/2022)   [facility]  Tobacco Use: High Risk (06/06/2024)   Received from [facility]     Readmission Risk Interventions     No data to display

## 2024-06-12 NOTE — Discharge Summary (Signed)
 Physician Discharge Summary  Gary [name] FMW:[id number] DOB: 1971/03/04 DOA: 06/09/2024  PCP: [facility] [address]  Admit date: 06/09/2024 Discharge date: 06/12/2024  Admitted From: Home Disposition: Inpatient psychiatric hospital   Recommendations for Outpatient Follow-up:  Follow up with PCP in 1-2 weeks Please obtain BMP/CBC in one week   Home Health:No Equipment/Devices:None  Discharge Condition:Stable CODE STATUS:Full Diet recommendation: Heart Healthy  Brief/Interim Summary: 53 y.o. male past medical history significant for bipolar disease, schizophrenia HIV comes into the ED after an intentional suicidal drug overdose, when EMS got to the home and empty bottle of oxycodone  90 tablets of 10 mg was emptied, there was also an open bottle of Seroquel , admitted to the ICU started on bicarb drip with improvement in mentation.  Poison control was consulted and he was started on the NAC protocol.   Significant Events: 6/21 presented with intentional drug overdose.  IVC paperwork completed admitted to ICU with Narcan  drip 6/22, confirmed with spouse and PMP aware patient has written prescription for Percocet.  Poison control involved and Tylenol  level obtained and elevated at 41 NAC protocol started  Discharge Diagnoses:  Principal Problem:   Drug overdose Active Problems:   Human immunodeficiency virus (HIV) disease (HCC)   BIPOLAR DISORDER UNSPECIFIED   Essential hypertension, benign   Cocaine dependence, uncomplicated (HCC)  Intentional suicidal attempt drug overdose: Unknown continue Tylenol  and Seroquel . Poison control was called who started on IV NAC at this acetaminophen  level was 41. He was started also on IV Narcan  drip. His LFTs improved. Neck treatment was completed.  Acetaminophen  toxicity/elevated LFTs: See above for the details not improved.  History of bipolar disorder/schizophrenia: Psych was consulted increase his risperidone. Recommended inpatient  psychiatric admission.  Acute kidney injury/metabolic acidosis:   resolved with IV fluid hydration.  Essential hypertension: No changes made to his medication.  HIV: Continue Biktarvy . Discharge Instructions  Discharge Instructions     Diet - low sodium heart healthy   Complete by: As directed    Increase activity slowly   Complete by: As directed       Allergies as of 06/12/2024       Reactions   Prunus Persica Hives   Only fresh D/T THE FUZZ   Tivicay  [dolutegravir ] Rash   Tolerated Biktarvy fine 2025.    Gabapentin     Nsaids Itching, Rash   Tramadol Hcl Itching, Rash   Can take Hydrocodone , Oxycodone         Medication List     STOP taking these medications    acetaminophen  500 MG tablet Commonly known as: TYLENOL    QUEtiapine  25 MG tablet Commonly known as: SEROQUEL    tiZANidine  2 MG tablet Commonly known as: ZANAFLEX        TAKE these medications    bictegravir-emtricitabine -tenofovir  AF 50-200-25 MG Tabs tablet Commonly known as: BIKTARVY Take 1 tablet by mouth daily. Try to take at the same time each day with or without food.   oxyCODONE -acetaminophen  10-325 MG tablet Commonly known as: PERCOCET Take 1 tablet by mouth 3 (three) times daily as needed for pain.   risperiDONE 1 MG tablet Commonly known as: RISPERDAL Take 1 tablet (1 mg total) by mouth daily AND 2 tablets (2 mg total) at bedtime.   tamsulosin 0.4 MG Caps capsule Commonly known as: FLOMAX Take 0.4 mg by mouth daily.        Allergies  Allergen Reactions   Prunus Persica Hives    Only fresh D/T THE FUZZ   Tivicay  [Dolutegravir ] Rash  Tolerated Biktarvy fine 2025.    Gabapentin     Nsaids Itching and Rash   Tramadol Hcl Itching and Rash    Can take Hydrocodone , Oxycodone     Consultations: PCCM Psychiatry   Procedures/Studies: No results found.   Subjective: No complaints  Discharge Exam: Vitals:   06/12/24 0054 06/12/24 0559  BP: 112/79 104/74   Pulse: 83 81  Resp: 15 16  Temp: 98.3 F (36.8 C) 98.1 F (36.7 C)  SpO2: 97% 95%   Vitals:   06/11/24 1517 06/11/24 2013 06/12/24 0054 06/12/24 0559  BP: (!) 135/94 128/81 112/79 104/74  Pulse: 92 (!) 103 83 81  Resp: 20 16 15 16   Temp: 98.4 F (36.9 C) 98 F (36.7 C) 98.3 F (36.8 C) 98.1 F (36.7 C)  TempSrc: Oral Oral Oral   SpO2: 97% 93% 97% 95%  Weight:    71.6 kg    General: Pt is alert, awake, not in acute distress Cardiovascular: RRR, S1/S2 +, no rubs, no gallops Respiratory: CTA bilaterally, no wheezing, no rhonchi Abdominal: Soft, NT, ND, bowel sounds + Extremities: no edema, no cyanosis    The results of significant diagnostics from this hospitalization (including imaging, microbiology, ancillary and laboratory) are listed below for reference.     Microbiology: Recent Results (from the past 240 hours)  MRSA Next Gen by PCR, Nasal     Status: None   Collection Time: 06/09/24  3:07 PM   Specimen: Nasal Mucosa; Nasal Swab  Result Value Ref Range Status   MRSA by PCR Next Gen NOT DETECTED NOT DETECTED Final    Comment: (NOTE) The GeneXpert MRSA Assay (FDA approved for NASAL specimens only), is one component of a comprehensive MRSA colonization surveillance program. It is not intended to diagnose MRSA infection nor to guide or monitor treatment for MRSA infections. Test performance is not FDA approved in patients less than 50 years old. Performed at [facility], [address], [place], [place] [address]      Labs: BNP (last 3 results) No results for input(s): BNP in the last 8760 hours. Basic Metabolic Panel: Recent Labs  Lab 06/09/24 1314 06/09/24 2152 06/10/24 0906 06/11/24 0412  NA 143 138 139 133*  K 3.8 5.2* 4.3 3.9  CL 104 109 107 104  CO2 19* 18* 24 25  GLUCOSE 135* 117* 103* 118*  BUN 24* 24* 13 12  CREATININE 2.92* 2.02* 1.32* 1.03  CALCIUM 8.6* 8.3* 8.7* 9.0  MG 2.0  --  1.9 1.7  PHOS  --   --  2.5 2.5   Liver  Function Tests: Recent Labs  Lab 06/09/24 2152 06/10/24 1201 06/10/24 2043 06/11/24 0412  AST 512* 394* 389* 347*  ALT 478* 430* 431* 378*  ALKPHOS 126 115 131* 136*  BILITOT 0.7 0.8 0.6 0.5  PROT 6.5 6.1* 6.7 6.3*  ALBUMIN 2.9* 2.5* 2.8* 2.8*   No results for input(s): LIPASE, AMYLASE in the last 168 hours. No results for input(s): AMMONIA in the last 168 hours. CBC: Recent Labs  Lab 06/09/24 1314 06/10/24 0906 06/11/24 0412  WBC 9.0 8.7 7.6  NEUTROABS 6.4  --  3.3  HGB 15.6 13.9 13.3  HCT 51.9 43.8 41.7  MCV 78.6* 75.3* 75.0*  PLT 246 207 211   Cardiac Enzymes: No results for input(s): CKTOTAL, CKMB, CKMBINDEX, TROPONINI in the last 168 hours. BNP: Invalid input(s): POCBNP CBG: Recent Labs  Lab 06/09/24 1604 06/09/24 2318 06/10/24 0611 06/10/24 1207 06/10/24 1539  GLUCAP 101*  145* 136* 117* 114*   D-Dimer No results for input(s): DDIMER in the last 72 hours. Hgb A1c No results for input(s): HGBA1C in the last 72 hours. Lipid Profile No results for input(s): CHOL, HDL, LDLCALC, TRIG, CHOLHDL, LDLDIRECT in the last 72 hours. Thyroid function studies No results for input(s): TSH, T4TOTAL, T3FREE, THYROIDAB in the last 72 hours.  Invalid input(s): FREET3 Anemia work up No results for input(s): VITAMINB12, FOLATE, FERRITIN, TIBC, IRON, RETICCTPCT in the last 72 hours. Urinalysis    Component Value Date/Time   COLORURINE AMBER (A) 06/09/2024 2131   APPEARANCEUR HAZY (A) 06/09/2024 2131   LABSPEC 1.027 06/09/2024 2131   PHURINE 5.0 06/09/2024 2131   GLUCOSEU NEGATIVE 06/09/2024 2131   HGBUR LARGE (A) 06/09/2024 2131   BILIRUBINUR NEGATIVE 06/09/2024 2131   KETONESUR NEGATIVE 06/09/2024 2131   PROTEINUR 30 (A) 06/09/2024 2131   NITRITE NEGATIVE 06/09/2024 2131   LEUKOCYTESUR NEGATIVE 06/09/2024 2131   Sepsis Labs Recent Labs  Lab 06/09/24 1314 06/10/24 0906 06/11/24 0412  WBC 9.0 8.7 7.6    Microbiology Recent Results (from the past 240 hours)  MRSA Next Gen by PCR, Nasal     Status: None   Collection Time: 06/09/24  3:07 PM   Specimen: Nasal Mucosa; Nasal Swab  Result Value Ref Range Status   MRSA by PCR Next Gen NOT DETECTED NOT DETECTED Final    Comment: (NOTE) The GeneXpert MRSA Assay (FDA approved for NASAL specimens only), is one component of a comprehensive MRSA colonization surveillance program. It is not intended to diagnose MRSA infection nor to guide or monitor treatment for MRSA infections. Test performance is not FDA approved in patients less than 8 years old. Performed at [facility], [address] [place], [place] [address]      Time coordinating discharge: Over 35 minutes  SIGNED:   Erle Odell Castor, MD  [facility] 06/12/2024, 8:51 AM Pager   If 7PM-7AM, please contact night-coverage [email or web address] Password [email or web address]

## 2024-06-12 NOTE — Plan of Care (Signed)

## 2024-06-26 ENCOUNTER — Other Ambulatory Visit: Payer: Self-pay | Admitting: Family Medicine

## 2024-06-26 DIAGNOSIS — Z122 Encounter for screening for malignant neoplasm of respiratory organs: Secondary | ICD-10-CM

## 2024-06-26 DIAGNOSIS — F17200 Nicotine dependence, unspecified, uncomplicated: Secondary | ICD-10-CM

## 2024-07-04 ENCOUNTER — Encounter: Payer: Self-pay | Admitting: Family Medicine

## 2024-07-09 ENCOUNTER — Other Ambulatory Visit

## 2024-08-30 LAB — COLOGUARD: COLOGUARD: NEGATIVE

## 2024-10-01 ENCOUNTER — Ambulatory Visit (HOSPITAL_COMMUNITY): Admission: EM | Admit: 2024-10-01 | Discharge: 2024-10-01 | Disposition: A | Discharge status: Home / Self Care

## 2024-10-01 ENCOUNTER — Encounter (HOSPITAL_COMMUNITY): Payer: Self-pay

## 2024-10-01 ENCOUNTER — Ambulatory Visit: Admitting: Infectious Diseases

## 2024-10-01 DIAGNOSIS — G8929 Other chronic pain: Secondary | ICD-10-CM

## 2024-10-01 DIAGNOSIS — M545 Low back pain, unspecified: Secondary | ICD-10-CM | POA: Diagnosis not present

## 2024-10-01 MED ORDER — DEXAMETHASONE SOD PHOSPHATE PF 10 MG/ML IJ SOLN
10.0000 mg | Freq: Once | INTRAMUSCULAR | Status: AC
  Administered 2024-10-01: 10 mg via INTRAMUSCULAR

## 2024-10-01 MED ORDER — ACETAMINOPHEN 325 MG PO TABS
ORAL_TABLET | ORAL | Status: AC
  Filled 2024-10-01: qty 3

## 2024-10-01 MED ORDER — ACETAMINOPHEN 325 MG PO TABS
975.0000 mg | ORAL_TABLET | Freq: Once | ORAL | Status: AC
  Administered 2024-10-01: 975 mg via ORAL

## 2024-10-01 MED ORDER — METHOCARBAMOL 500 MG PO TABS: 500.0000 mg | ORAL_TABLET | Freq: Four times a day (QID) | ORAL | 0 refills | Status: AC | PRN

## 2024-10-01 NOTE — ED Triage Notes (Signed)
 Patient c/o bilateral lower back pain that radiates into the mid back x 5 days. Patient denies any injury. Patient states he uses a cane to walk, but having to rely on it more since the back pain started.  Patient states he has been taking Oxycodone  and a muscle relaxer (doesn't know the name).

## 2024-10-01 NOTE — ED Provider Notes (Signed)
 [facility]    CSN: [id number] Arrival date & time: 10/01/24  1115      History   Chief Complaint Chief Complaint  Patient presents with   Back Pain    HPI Gary [name] is a 53 y.o. male.   Patient presents today due to 5 days worth of bilateral lower back pain.  Patient denies radiation of pain down the legs, saddle anesthesia, or changes in bowel or bladder habits.  Patient states that he was using oxycodone  for pain without significant relief.  Patient states that he has been using a muscle relaxer but is unsure of the name of it.  Patient states that he has a history of chronic back pain and denies known injury.   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   Severe episode of recurrent major depressive disorder, 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/06/2022   Tetrahydrocannabinol (THC) use disorder, moderate, dependence (HCC) 04/05/2022   Lumbar disc disease with radiculopathy 09/03/2019   Hepatitis B immune 11/15/2017   Alcohol use disorder 05/29/2016   Cocaine dependence, uncomplicated (HCC) 05/29/2016   Tetrahydrocannabinol (THC) use disorder, mild, abuse 05/29/2016   Lymphadenopathy, supraclavicular 03/03/2016   Cervical radiculopathy 09/20/2013   Chronic pain syndrome 09/20/2013   Neck pain 09/20/2013   Smoking 10/06/2011   Encounter for long-term  (current) use of other medications 04/19/2011   Human immunodeficiency virus (HIV) disease (HCC) 11/26/2009   HERPES ZOSTER, HX OF 11/26/2009   SICKLE-CELL TRAIT 09/25/2009   BIPOLAR DISORDER UNSPECIFIED 09/25/2009   Essential hypertension, benign 09/25/2009   DISC DISEASE, CERVICAL 09/25/2009   Sickle cell trait 09/25/2009    Past Surgical History:  Procedure Laterality Date   AMPUTATION  11/04/2011   Procedure: AMPUTATION DIGIT;  Surgeon: Cordella Glendia Hutchinson;  Location: [facility] OR;  Service: Orthopedics;  Laterality: Right;  Right Second Toe Distal Phalanx Amputation   BUNIONECTOMY     CERVICAL FUSION         Home Medications    Prior to Admission medications   Medication Sig Start Date End Date Taking? Authorizing Provider  bictegravir-emtricitabine -tenofovir  AF (BIKTARVY ) 50-200-25 MG TABS tablet Take 1 tablet by mouth daily. Try to take at the same time each day with or without food. 03/26/24   Melvenia Corean SAILOR, NP  oxyCODONE -acetaminophen  (PERCOCET) 10-325 MG tablet Take 1 tablet by mouth 3 (three) times daily as needed for pain. 06/06/24   [provider]  risperiDONE  (RISPERDAL ) 1 MG tablet Take 1 tablet (1 mg total) by mouth daily AND 2 tablets (2 mg total) at bedtime. 06/12/24   Odell Celinda Balo, MD  tamsulosin  (FLOMAX ) 0.4 MG CAPS capsule Take 0.4 mg by mouth daily. 03/24/24   [provider]    Family History Family History  Problem Relation Age of Onset   Diabetes Father    Diabetes Brother    Liver disease Neg Hx  Colon cancer Neg Hx    Esophageal cancer Neg Hx     Social History Social History   Tobacco Use   Smoking status: Every Day    Types: E-cigarettes   Smokeless tobacco: Never   Tobacco comments:    Not ready to quit vaping.   Vaping Use   Vaping status: Every Day   Substances: Nicotine , THC  Substance Use Topics   Alcohol use: Yes   Drug use: Not Currently    Frequency: 3.0 times per week    Types: Marijuana    Comment:  every day     Allergies   Prunus persica, Tivicay  [dolutegravir ], Gabapentin , Nsaids, and Tramadol hcl   Review of Systems Review of Systems  Musculoskeletal:  Positive for back pain.     Physical Exam Triage Vital Signs ED Triage Vitals [10/01/24 1251]  Encounter Vitals Group     BP (!) 131/90     Girls Systolic BP Percentile      Girls Diastolic BP Percentile      Boys Systolic BP Percentile      Boys Diastolic BP Percentile      Pulse Rate 76     Resp 16     Temp 98.6 F (37 C)     Temp Source Oral     SpO2 96 %     Weight      Height      Head Circumference      Peak Flow      Pain Score 10     Pain Loc      Pain Education      Exclude from Growth Chart    No data found.  Updated Vital Signs BP (!) 131/90 (BP Location: Right Arm)   Pulse 76   Temp 98.6 F (37 C) (Oral)   Resp 16   SpO2 96%   Visual Acuity Right Eye Distance:   Left Eye Distance:   Bilateral Distance:    Right Eye Near:   Left Eye Near:    Bilateral Near:     Physical Exam Vitals and nursing note reviewed.  Constitutional:      General: He is not in acute distress.    Appearance: Normal appearance. He is not ill-appearing, toxic-appearing or diaphoretic.  Eyes:     General: No scleral icterus. Cardiovascular:     Rate and Rhythm: Normal rate and regular rhythm.     Heart sounds: Normal heart sounds.  Pulmonary:     Effort: Pulmonary effort is normal. No respiratory distress.     Breath sounds: Normal breath sounds. No wheezing or rhonchi.  Musculoskeletal:     Lumbar back: Decreased range of motion.     Comments: Pain elicited with active range of motion of the lumbar spine  Skin:    General: Skin is warm.  Neurological:     Mental Status: He is alert and oriented to person, place, and time.  Psychiatric:        Mood and Affect: Mood normal.        Behavior: Behavior normal.      UC Treatments / Results  Labs (all labs ordered are listed, but only abnormal  results are displayed) Labs Reviewed - No data to display  EKG   Radiology No results found.  Procedures Procedures (including critical care time)  Medications Ordered in UC Medications  acetaminophen  (TYLENOL ) tablet 975 mg (has no administration in time range)  dexamethasone (DECADRON) injection 10 mg (has no  administration in time range)    Initial Impression / Assessment and Plan / UC Course  I have reviewed the triage vital signs and the nursing notes.  Pertinent labs & imaging results that were available during my care of the patient were reviewed by me and considered in my medical decision making (see chart for details).     Chronic bilateral lumbar back pain without sciatica- mild relief with an office treatment of Decadron 10 mg IM.  Sent Robaxin  500 mg every 6 hours as needed to pharmacy.  Advised patient to follow-up with pain specialist. Final Clinical Impressions(s) / UC Diagnoses   Final diagnoses:  None   Discharge Instructions   None    ED Prescriptions   None    PDMP not reviewed this encounter.   Andra Corean BROCKS, PA-C 10/01/24 1405

## 2024-10-03 ENCOUNTER — Ambulatory Visit: Admitting: Infectious Diseases

## 2024-10-17 ENCOUNTER — Other Ambulatory Visit (HOSPITAL_COMMUNITY): Payer: Self-pay

## 2024-10-17 ENCOUNTER — Telehealth: Payer: Self-pay

## 2024-10-17 NOTE — Telephone Encounter (Signed)
 [facility] Patient Advocate Encounter  Insurance verification completed.    The patient is insured through [employer].     Ran test claim for BIKTARVY   The current 30 day co-pay is $0.  Ran test claim for DOVATO  The current 30 day co-pay is $0.  Ran test claim for SYMTUZA   The current 30 day co-pay is $0.   We will continue to follow to see if copay assistance is needed.  This test claim was processed through [facility]- copay amounts may vary at other pharmacies due to pharmacy/plan contracts, or as the patient moves through the different stages of their insurance plan.

## 2024-10-22 ENCOUNTER — Encounter: Payer: Self-pay | Admitting: Family Medicine

## 2024-10-22 DIAGNOSIS — K838 Other specified diseases of biliary tract: Secondary | ICD-10-CM

## 2024-10-29 ENCOUNTER — Ambulatory Visit: Admitting: Infectious Diseases

## 2024-11-28 ENCOUNTER — Ambulatory Visit: Admitting: Infectious Diseases

## 2025-01-01 ENCOUNTER — Other Ambulatory Visit: Payer: Self-pay

## 2025-01-01 NOTE — Progress Notes (Signed)
 ERROR

## 2025-01-06 ENCOUNTER — Other Ambulatory Visit: Payer: Self-pay | Admitting: Infectious Diseases

## 2025-01-06 DIAGNOSIS — B20 Human immunodeficiency virus [HIV] disease: Secondary | ICD-10-CM

## 2025-01-21 ENCOUNTER — Ambulatory Visit: Admitting: Infectious Diseases

## 2025-02-07 ENCOUNTER — Ambulatory Visit: Admitting: Infectious Diseases
